# Patient Record
Sex: Female | Born: 1935 | Race: White | Hispanic: No | Smoking: Former smoker
Health system: Southern US, Community
[De-identification: ages and names within clinical notes are randomized; demographics above are authoritative.]

## PROBLEM LIST (undated history)

## (undated) DIAGNOSIS — D649 Anemia, unspecified: Secondary | ICD-10-CM

## (undated) DIAGNOSIS — K7460 Unspecified cirrhosis of liver: Secondary | ICD-10-CM

## (undated) DIAGNOSIS — K56609 Unspecified intestinal obstruction, unspecified as to partial versus complete obstruction: Secondary | ICD-10-CM

## (undated) DIAGNOSIS — Z5189 Encounter for other specified aftercare: Secondary | ICD-10-CM

## (undated) DIAGNOSIS — K5090 Crohn's disease, unspecified, without complications: Secondary | ICD-10-CM

## (undated) DIAGNOSIS — H5440 Blindness, one eye, unspecified eye: Secondary | ICD-10-CM

## (undated) DIAGNOSIS — J9610 Chronic respiratory failure, unspecified whether with hypoxia or hypercapnia: Secondary | ICD-10-CM

## (undated) DIAGNOSIS — K219 Gastro-esophageal reflux disease without esophagitis: Secondary | ICD-10-CM

## (undated) DIAGNOSIS — I480 Paroxysmal atrial fibrillation: Secondary | ICD-10-CM

## (undated) DIAGNOSIS — M1990 Unspecified osteoarthritis, unspecified site: Secondary | ICD-10-CM

## (undated) DIAGNOSIS — R0602 Shortness of breath: Secondary | ICD-10-CM

## (undated) DIAGNOSIS — I509 Heart failure, unspecified: Secondary | ICD-10-CM

## (undated) DIAGNOSIS — J449 Chronic obstructive pulmonary disease, unspecified: Secondary | ICD-10-CM

## (undated) DIAGNOSIS — I10 Essential (primary) hypertension: Secondary | ICD-10-CM

## (undated) DIAGNOSIS — F419 Anxiety disorder, unspecified: Secondary | ICD-10-CM

## (undated) DIAGNOSIS — K429 Umbilical hernia without obstruction or gangrene: Secondary | ICD-10-CM

## (undated) DIAGNOSIS — I517 Cardiomegaly: Secondary | ICD-10-CM

## (undated) DIAGNOSIS — J90 Pleural effusion, not elsewhere classified: Secondary | ICD-10-CM

## (undated) DIAGNOSIS — R601 Generalized edema: Secondary | ICD-10-CM

## (undated) DIAGNOSIS — G709 Myoneural disorder, unspecified: Secondary | ICD-10-CM

## (undated) HISTORY — PX: BASAL CELL CARCINOMA EXCISION: SHX1214

## (undated) HISTORY — PX: TUBAL LIGATION: SHX77

## (undated) HISTORY — PX: OTHER SURGICAL HISTORY: SHX169

## (undated) HISTORY — DX: Crohn's disease, unspecified, without complications: K50.90

## (undated) HISTORY — PX: APPENDECTOMY: SHX54

## (undated) HISTORY — PX: VESICOVAGINAL FISTULA CLOSURE W/ TAH: SUR271

## (undated) HISTORY — PX: COLON SURGERY: SHX602

## (undated) HISTORY — PX: CHOLECYSTECTOMY: SHX55

## (undated) HISTORY — DX: Paroxysmal atrial fibrillation: I48.0

## (undated) HISTORY — PX: ABDOMINAL SURGERY: SHX537

## (undated) HISTORY — DX: Blindness, one eye, unspecified eye: H54.40

## (undated) HISTORY — DX: Anxiety disorder, unspecified: F41.9

## (undated) HISTORY — PX: ABDOMINAL HYSTERECTOMY: SHX81

## (undated) HISTORY — DX: Essential (primary) hypertension: I10

---

## 1998-04-16 ENCOUNTER — Ambulatory Visit (HOSPITAL_COMMUNITY): Admission: RE | Admit: 1998-04-16 | Discharge: 1998-04-16 | Payer: Self-pay | Admitting: Gastroenterology

## 2003-01-09 ENCOUNTER — Ambulatory Visit (HOSPITAL_COMMUNITY): Admission: RE | Admit: 2003-01-09 | Discharge: 2003-01-09 | Payer: Self-pay | Admitting: Internal Medicine

## 2004-02-25 ENCOUNTER — Other Ambulatory Visit: Admission: RE | Admit: 2004-02-25 | Discharge: 2004-02-25 | Payer: Self-pay | Admitting: Family Medicine

## 2004-09-16 ENCOUNTER — Ambulatory Visit: Payer: Self-pay | Admitting: Internal Medicine

## 2004-10-01 ENCOUNTER — Ambulatory Visit (HOSPITAL_COMMUNITY): Admission: RE | Admit: 2004-10-01 | Discharge: 2004-10-01 | Payer: Self-pay | Admitting: Internal Medicine

## 2004-10-01 ENCOUNTER — Ambulatory Visit: Payer: Self-pay | Admitting: Internal Medicine

## 2004-12-01 ENCOUNTER — Ambulatory Visit: Payer: Self-pay | Admitting: Internal Medicine

## 2005-04-03 ENCOUNTER — Ambulatory Visit: Payer: Self-pay | Admitting: Cardiology

## 2005-04-07 ENCOUNTER — Ambulatory Visit: Payer: Self-pay | Admitting: Internal Medicine

## 2005-04-13 ENCOUNTER — Ambulatory Visit: Payer: Self-pay | Admitting: Cardiology

## 2005-08-18 ENCOUNTER — Ambulatory Visit: Payer: Self-pay | Admitting: Internal Medicine

## 2005-08-26 ENCOUNTER — Ambulatory Visit (HOSPITAL_COMMUNITY): Admission: RE | Admit: 2005-08-26 | Discharge: 2005-08-26 | Payer: Self-pay | Admitting: Internal Medicine

## 2006-03-10 ENCOUNTER — Ambulatory Visit: Payer: Self-pay | Admitting: Gastroenterology

## 2006-08-18 ENCOUNTER — Ambulatory Visit: Payer: Self-pay | Admitting: Cardiology

## 2006-08-19 ENCOUNTER — Ambulatory Visit: Payer: Self-pay | Admitting: Internal Medicine

## 2006-08-25 ENCOUNTER — Ambulatory Visit: Payer: Self-pay | Admitting: Internal Medicine

## 2006-09-22 ENCOUNTER — Ambulatory Visit: Payer: Self-pay | Admitting: Internal Medicine

## 2007-03-02 ENCOUNTER — Ambulatory Visit: Payer: Self-pay | Admitting: Cardiology

## 2007-03-04 ENCOUNTER — Ambulatory Visit: Payer: Self-pay

## 2007-03-04 LAB — CONVERTED CEMR LAB
INR: 4 — ABNORMAL HIGH (ref 0.8–1.0)
Prothrombin Time: 25.4 s — ABNORMAL HIGH (ref 10.9–13.3)

## 2007-03-15 ENCOUNTER — Inpatient Hospital Stay (HOSPITAL_COMMUNITY): Admission: EM | Admit: 2007-03-15 | Discharge: 2007-03-16 | Payer: Self-pay | Admitting: Emergency Medicine

## 2007-03-15 ENCOUNTER — Ambulatory Visit: Payer: Self-pay | Admitting: Internal Medicine

## 2007-04-20 ENCOUNTER — Ambulatory Visit: Payer: Self-pay | Admitting: Cardiology

## 2007-06-08 ENCOUNTER — Ambulatory Visit: Payer: Self-pay | Admitting: Cardiology

## 2007-10-03 ENCOUNTER — Ambulatory Visit: Payer: Self-pay | Admitting: Cardiology

## 2007-11-09 ENCOUNTER — Ambulatory Visit: Payer: Self-pay | Admitting: Cardiology

## 2007-12-07 ENCOUNTER — Ambulatory Visit: Payer: Self-pay | Admitting: Cardiology

## 2008-04-25 ENCOUNTER — Ambulatory Visit: Payer: Self-pay | Admitting: Cardiology

## 2008-08-30 DIAGNOSIS — K5090 Crohn's disease, unspecified, without complications: Secondary | ICD-10-CM | POA: Insufficient documentation

## 2008-08-30 DIAGNOSIS — E119 Type 2 diabetes mellitus without complications: Secondary | ICD-10-CM

## 2008-08-30 DIAGNOSIS — E663 Overweight: Secondary | ICD-10-CM | POA: Insufficient documentation

## 2008-08-30 DIAGNOSIS — H5440 Blindness, one eye, unspecified eye: Secondary | ICD-10-CM

## 2008-08-30 DIAGNOSIS — I10 Essential (primary) hypertension: Secondary | ICD-10-CM | POA: Insufficient documentation

## 2008-08-30 DIAGNOSIS — I4891 Unspecified atrial fibrillation: Secondary | ICD-10-CM | POA: Insufficient documentation

## 2008-08-30 DIAGNOSIS — F411 Generalized anxiety disorder: Secondary | ICD-10-CM | POA: Insufficient documentation

## 2008-09-05 ENCOUNTER — Ambulatory Visit: Payer: Self-pay | Admitting: Cardiology

## 2008-09-27 ENCOUNTER — Telehealth: Payer: Self-pay | Admitting: Cardiology

## 2008-10-03 ENCOUNTER — Ambulatory Visit: Payer: Self-pay | Admitting: Cardiology

## 2009-02-18 ENCOUNTER — Encounter: Payer: Self-pay | Admitting: Cardiology

## 2009-02-19 ENCOUNTER — Encounter: Payer: Self-pay | Admitting: Cardiology

## 2009-04-02 ENCOUNTER — Ambulatory Visit: Payer: Self-pay | Admitting: Cardiology

## 2009-05-21 ENCOUNTER — Ambulatory Visit: Payer: Self-pay | Admitting: Cardiology

## 2009-05-21 ENCOUNTER — Encounter: Payer: Self-pay | Admitting: Cardiology

## 2009-05-22 ENCOUNTER — Encounter: Payer: Self-pay | Admitting: Cardiology

## 2009-06-03 ENCOUNTER — Ambulatory Visit: Payer: Self-pay | Admitting: Cardiology

## 2009-06-06 ENCOUNTER — Encounter: Payer: Self-pay | Admitting: Cardiology

## 2009-09-17 ENCOUNTER — Encounter: Payer: Self-pay | Admitting: Cardiology

## 2009-09-30 ENCOUNTER — Telehealth (INDEPENDENT_AMBULATORY_CARE_PROVIDER_SITE_OTHER): Payer: Self-pay | Admitting: *Deleted

## 2009-11-06 ENCOUNTER — Encounter: Payer: Self-pay | Admitting: Cardiology

## 2009-12-03 ENCOUNTER — Ambulatory Visit: Payer: Self-pay | Admitting: Cardiology

## 2010-07-01 ENCOUNTER — Ambulatory Visit
Admission: RE | Admit: 2010-07-01 | Discharge: 2010-07-01 | Payer: Self-pay | Source: Home / Self Care | Attending: Internal Medicine | Admitting: Internal Medicine

## 2010-07-01 NOTE — Assessment & Plan Note (Signed)
Summary: [facility]   Visit Type:  Follow-up Primary Provider:  Rudi Heap  CC:  Atrial Fibrillation.  History of Present Illness: The patient presents for follow up after  a brief hospitalization when she was noted to have atrial fibrillation with a slightly reduced ventricular rate. Her heart rate was in the 40s and 50s though there were no significant pauses. She ruled out for myocardial infarction. She did have her digoxin dose reduced in half. Her level on admission was 1.1. She says that since leaving the hospital several days ago she has had no slow heart rates, presyncope or syncope. She has not been feeling a rapid rates that were bothering her before. She has had no chest pain or shortness of breath.  Preventive Screening-Counseling & Management  Alcohol-Tobacco     Smoking Status: current     Packs/Day: 0.5     Year Started: 1958  Comments: off/on  Current Medications (verified): 1)  Prevacid 15 Mg Cpdr (Lansoprazole) .... Take 1 Tablet By Mouth Once A Day 2)  Warfarin Sodium 1 Mg Tabs (Warfarin Sodium) .... Use As Directed By Dr. Kathi Der Office. 3)  Metformin Hcl 1000 Mg Tabs (Metformin Hcl) .... Take 1 Tablet By Mouth Two Times A Day 4)  Alprazolam 0.5 Mg  Tabs (Alprazolam) .... As Needed 5)  Multivitamins   Tabs (Multiple Vitamin) .... Once Daily 6)  Vitamin C 500 Mg  Tabs (Ascorbic Acid) .... Once Daily 7)  Digoxin 0.25 Mg Tabs (Digoxin) .... Take 1/2 Tablet By Mouth Daily 8)  Acetaminophen 500 Mg  Caps (Acetaminophen) .... Take 1 Capsule By Mouth Two Times A Day As Needed 9)  Maxzide 75-50 Mg Tabs (Triamterene-Hctz) .... Once Daily 10)  Cardizem Cd 180 Mg Xr24h-Cap (Diltiazem Hcl Coated Beads) .... Once Daily 11)  Potassium Chloride Cr 10 Meq Cr-Caps (Potassium Chloride) .... Take One Tablet By Mouth Twice Weekly 12)  Vitamin D 500 Iu .... Take 1 Tablet By Mouth Once A Day 13)  Asacol Hd 800 Mg Tbec (Mesalamine) .... Take 1 Tablet By Mouth Two Times A  Day 14)  Vitamin B12 Injection .... Once Per Month 15)  Multivitamins  Tabs (Multiple Vitamin) .... Take 1 Tablet By Mouth Once A Day  Allergies: 1)  Sulfa  Comments:  Nurse/Medical Assistant: The patient's medications were reviewed with the patient and were updated in the Medication List. Pt brought a list of her medications.  Past History:  Past Medical History: Reviewed history from 04/02/2009 and no changes required. 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 Basal cell Cancers resected Multiple abdominal surgeries Hysterectomy Diabetes.   Review of Systems       As stated in the HPI and negative for all other systems.   Vital Signs:  Patient profile:   75 year old female Height:      60 inches Weight:      185.25 pounds Pulse rate:   89 / minute BP sitting:   135 / 84  (left arm) Cuff size:   regular  Vitals Entered By: Cyril Loosen, RN, BSN (June 03, 2009 11:59 AM) CC: Atrial Fibrillation Comments Follow up post hospital. No complaints since d/c.   Physical Exam  General:  Well developed, well nourished, in no acute distress. Head:  normocephalic and atraumatic Mouth:  Gums and palate normal. Oral  mucosa normal. Neck:  Neck supple, no JVD. No masses, thyromegaly or abnormal cervical nodes. Chest Wall:  no deformities or breast masses noted Lungs:  Clear bilaterally to auscultation and percussion. Heart:  Non-displaced PMI, chest non-tender;irregular rate and rhythm, S1, S2 without murmurs, rubs or gallops. Carotid upstroke normal, no bruit. Normal abdominal aortic size, no bruits. Femorals normal pulses, no bruits. Pedals normal pulses. No edema, no varicosities. Abdomen:  Bowel sounds positive; abdomen soft and non-tender without masses,  organomegaly, or hernias noted. No hepatosplenomegaly. Msk:  Back normal, normal gait. Muscle strength and tone normal. Extremities:  No clubbing or cyanosis. Neurologic:  Alert and oriented x 3. Skin:  Intact without lesions or rashes. Psych:  Normal affect.   Impression & Recommendations:  Problem # 1:  ATRIAL FIBRILLATION (ICD-427.31) Her her rate seems to be better with no symptomatic bradycardia arrhythmias and currently no symptomatic tachycardia arrhythmias. Therefore, she will continue on the current regimen. She will continue on the Coumadin which is followed by Dr. Christell Constant. Orders: EKG w/ Interpretation (93000)  Problem # 2:  HYPERTENSION, UNSPECIFIED (ICD-401.9) Her blood pressure is controlled and she will continue the medicines as listed.  Patient Instructions: 1)  Your physician recommends that you continue on your current medications as directed. Please refer to the Current Medication list given to you today. 2)  Follow up in 6 months.

## 2010-07-01 NOTE — Progress Notes (Signed)
Summary: Low HR  Phone Note Call from Patient Call back at [facility] Phone [phone]   Summary of Call: Pt called the office stating her HR was running around 41. She states it's up to about 46 now. She feels weak but denies any other symptoms at present. She was admitted to [facility] in mid-December with low HR. At that time her digoxin was decreased to 1/2 tablet daily. She wants to know if she should take digoxin today. Pt notified to hold digoxin today and resume tomorrow if HR has increased. Pt is aware to go to ER for worsening symptoms, dizziness or syncope. Pt verbalized understanding. She is aware we will d/w Dr. Antoine Poche and notify her of his recommendations.  Initial call taken by: Cyril Loosen, RN, BSN,  Sep 30, 2009 11:55 AM  Follow-up for Phone Call        Tell her to stop digoxin completely. Follow-up by: Rollene Rotunda, MD, [facility],  Oct 01, 2009 5:34 PM  Additional Follow-up for Phone Call Additional follow up Details #1::        Pt notified and verbalized understanding. She will notify us if bradycardia continues after stopping Digoxin. Additional Follow-up by: Cyril Loosen, RN, BSN,  Oct 02, 2009 8:27 AM

## 2010-07-01 NOTE — Assessment & Plan Note (Signed)
Summary: 6 mo fu per july reminder-srs   Visit Type:  Follow-up Primary Provider:  Rudi Heap  CC:  Atrial Fibrillation.  History of Present Illness: The patient presents for followup. Since I last saw her she has had no new complaints. She is gaining weight because she admits to being too much. She says she does try to walk 3-4 times per week. She doesn't report any palpitations, presyncope or syncope. She doesn't report chest discomfort, neck or arm discomfort. She has no new shortness of breath, PND or orthopnea.  Preventive Screening-Counseling & Management  Alcohol-Tobacco     Smoking Status: current     Smoking Cessation Counseling: yes     Packs/Day: 1/2 ppd  Current Medications (verified): 1)  Prevacid 15 Mg Cpdr (Lansoprazole) .... Take 1 Tablet By Mouth Once A Day 2)  Warfarin Sodium 1 Mg Tabs (Warfarin Sodium) .... Use As Directed By Dr. Kathi Der Office. 3)  Metformin Hcl 1000 Mg Tabs (Metformin Hcl) .... Take 1 Tablet By Mouth Two Times A Day 4)  Alprazolam 0.5 Mg  Tabs (Alprazolam) .... As Needed 5)  Vitamin C 500 Mg  Tabs (Ascorbic Acid) .... Once Daily 6)  Acetaminophen 500 Mg  Caps (Acetaminophen) .... Take 1 Capsule By Mouth Two Times A Day As Needed 7)  Maxzide 75-50 Mg Tabs (Triamterene-Hctz) .... Once Daily 8)  Cardizem Cd 180 Mg Xr24h-Cap (Diltiazem Hcl Coated Beads) .... Once Daily 9)  Potassium Chloride Cr 10 Meq Cr-Caps (Potassium Chloride) .... Take One Tablet By Mouth Twice Weekly 10)  Vitamin D 500 Iu .... Take 1 Tablet By Mouth Once A Day 11)  Asacol Hd 800 Mg Tbec (Mesalamine) .... Take 1 Tablet By Mouth Two Times A Day 12)  Vitamin B12 Injection .... Once Per Month 13)  Multivitamins  Tabs (Multiple Vitamin) .... Take 1 Tablet By Mouth Once A Day 14)  Gabapentin 300 Mg Caps (Gabapentin) .... Take 1 Tablet By Mouth Once A Day  Allergies (verified): 1)  Sulfa  Comments:  Nurse/Medical Assistant: The patient's medication bottles and allergies were  reviewed with the patient and were updated in the Medication and Allergy Lists.  Past History:  Past Medical History: Reviewed history from 04/02/2009 and no changes required. 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 Basal cell Cancers resected Multiple abdominal surgeries Hysterectomy Diabetes.   Social History: Packs/Day:  1/2 ppd  Review of Systems       As stated in the HPI and negative for all other systems.   Vital Signs:  Patient profile:   75 year old female Height:      60 inches Weight:      197 pounds Pulse rate:   80 / minute BP sitting:   118 / 73  (left arm) Cuff size:   large  Vitals Entered By: Carlye Grippe (December 03, 2009 11:18 AM)  Physical Exam  General:  Well developed, well nourished, in no acute distress. Head:  normocephalic and atraumatic Neck:  Neck supple, no JVD. No masses, thyromegaly or abnormal cervical nodes. Chest Wall:  no deformities or breast masses noted Lungs:  Clear bilaterally to auscultation and percussion. Heart:  Irregular, S1 and S2 within normal limits, no S3, no clicks, no rubs, no murmurs. Abdomen:  Bowel sounds positive; abdomen soft and non-tender without masses, organomegaly, or hernias  noted. No hepatosplenomegaly. Msk:  Back normal, normal gait. Muscle strength and tone normal. Extremities:  No clubbing or cyanosis. Neurologic:  Alert and oriented x 3. Skin:  Intact without lesions or rashes, multiple actinic keratosis Psych:  Normal affect.    Impression & Recommendations:  Problem # 1:  ATRIAL FIBRILLATION (ICD-427.31) She appears to be in atrial fibrillation at this time by exam. She is not particularly noticing this. She has had no symptomatic slow rates which has been her recent problem. She  tolerates Coumadin. No change in therapy is indicated.  Problem # 2:  OVERWEIGHT/OBESITY (ICD-278.02) She understands the need to cut back her calories and increase her activity to lose weight. Unfortunately she is going in the opposite direction.  Problem # 3:  HYPERTENSION, UNSPECIFIED (ICD-401.9) Her blood pressure is controlled and she will continue the meds as listed.  Patient Instructions: 1)  Your physician wants you to follow-up in: 1 year. You will receive a reminder letter in the mail one-two months in advance. If you don't receive a letter, please call our office to schedule the follow-up appointment. 2)  Your physician recommends that you continue on your current medications as directed. Please refer to the Current Medication list given to you today.

## 2010-10-14 NOTE — Assessment & Plan Note (Signed)
[facility]                            CARDIOLOGY OFFICE NOTE   NAME:[name], [name] TEEM                    MRN:          [id number]  DATE:09/05/2008                            DOB:          09-12-1935    PRIMARY CARE PHYSICIAN:  Ernestina Penna, MD   REASON FOR PRESENTATION:  Evaluate the patient with atrial fibrillation  and hypertension.   HISTORY OF PRESENT ILLNESS:  The patient returns for 46-month followup.  She has done okay since I last saw her.  She has had no new  palpitations, presyncope, or syncope.  She has no new shortness of  breath and denies any PND or orthopnea.  She does get dyspneic with  moderate exertion such as walking for moderate distance on level ground.  This is unchanged.  She has been diagnosed with cirrhosis and is being  worked up for possible hepatitis C.   PAST MEDICAL HISTORY:  1. Hypertension x5 years.  2. Paroxysmal atrial fibrillation.  3. Anxiety.  4. Crohn disease.  5. 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  6. Basal cell cancer resected.  7. Multiple abdominal surgeries.  8. Hysterectomy.  9. Diabetes.   ALLERGIES:  SULFA, KEFLEX, CIPRO, and NONSTEROIDALS.   MEDICATIONS:  1. Tylenol.  2. Asacol.  3. Prevacid.  4. Multivitamin.  5. Potassium.  6. Vitamin C.  7. Coumadin.  8. Digoxin 0.25 mg daily.  9. Maxzide 75/50 daily.  10.Cardizem 180 mg daily.  11.Januvia.  12.Metformin 1000 mg b.i.d.   REVIEW OF SYSTEMS:  As stated in the HPI and otherwise negative for  other systems.   PHYSICAL EXAMINATION:  GENERAL:  The patient is in no distress.  VITAL SIGNS:  Blood pressure 126/62 and heart rate 51 and regular.  HEENT:  Eyelids unremarkable; pupils are equal, round, and reactive to  light; fundi not visualized.  NECK:  No jugular venous distention at 45 degrees, carotid upstroke  brisk and symmetric, no bruits, no thyromegaly.  LUNGS:   Clear to auscultation bilaterally.  HEART:  PMI not displaced or sustained; S1 and S2 within normal limits;  no S3, no S4; no clicks, no rubs, no murmurs.  ABDOMEN:  Obese; positive bowel sounds, normal in frequency and pitch;  no bruits, no rebound, no guarding, no midline pulsatile mass; no  organomegaly.  SKIN:  No nodules, multiple actinic keratosis.  NEURO:  Grossly intact.   EKG, sinus bradycardia, rate 51, axis within normal limits, intervals  within normal limits, no acute ST-wave changes.   ASSESSMENT AND PLAN:  1. Atrial fibrillation.  The patient has had no paroxysm of this.  No      further cardiovascular evaluation is warranted.  She will continue      on the meds as listed.  She continues on the Coumadin.  2. Hypertension.  Blood pressure is well controlled and she brought a      blood pressure diary.  I will make no change in her medical regimen      at this point.  3. Followup.  I will see her back as needed based on future symptoms.     Rollene Rotunda, MD, [facility]  Electronically Signed    JH/MedQ  DD: 09/05/2008  DT: 09/06/2008  Job #: [id number]   cc:   Ernestina Penna, M.D.

## 2010-10-14 NOTE — Assessment & Plan Note (Signed)
[facility]                            CARDIOLOGY OFFICE NOTE   NAME:[name], [name]                    MRN:          [id number]  DATE:12/07/2007                            DOB:          1935/09/30    PRIMARY CARE PHYSICIAN:  Ernestina Penna, MD   REASON FOR PRESENTATION:  Evaluate the patient with atrial fibrillation.   HISTORY OF PRESENT ILLNESS:  The patient is a pleasant 75 year old white  female with a history of paroxysmal atrial fibrillation.  Since I last  saw her in January, she has had two [place] visits for palpitations.  One was in May.  The last was on November 09, 2007.  She states she would  wait about 2 or 3 days with the atrial fibrillation before she will  present.  I only have the May hospitalization report.  It is not  entirely clear from this that she was in atrial fibrillation, but she  was having chest pressure.  She reports that she was in fibrillation,  but did not have her meds changed.  She states she has not had any  episode since that time.  When she does have the tachypalpitations, she  may get little short of breath.  She may feel a little weak.  However,  she states she really does not have any chest pressure, neck, or arm  discomfort.  She has not had any presyncope or syncope.  She denies any  PND or orthopnea.   PAST MEDICAL HISTORY:  1. Hypertension x5 years.  2. Paroxysmal atrial fibrillation.  3. Anxiety.  4. Crohn disease.  5. 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  6. Basal cell cancer, resected.  7. Multiple abdominal surgeries for Crohn's.  8. Hysterectomy.   ALLERGIES:  SULFA, KEFLEX, CIPRO, and NONSTEROIDALS.   MEDICATIONS:  1. Tylenol.  2. Asacol.  3. Prevacid 30 mg daily.  4. Potassium.  5. Multivitamin.  6. Coumadin.  7. Cardizem 120 mg daily.  8. Digoxin 0.25 mg daily.  9. Maxzide 75/50.   REVIEW OF SYSTEMS:  As stated in the  HPI and otherwise negative for  other systems.   PHYSICAL EXAMINATION:  GENERAL:  The patient is in no acute distress.  VITAL SIGNS:  Blood pressure 176/64, heart rate 66 and regular, weight  234 pounds, and body mass index 46.  HEENT:  Eyelids are unremarkable; pupils equal, round, and reactive to  light; fundi not visualized; oral mucosa unremarkable.  NECK:  No jugular venous distention at 45 degrees; carotid upstroke  brisk and symmetric; no bruits and no thyromegaly.  LYMPHATICS:  No cervical, axillary, or inguinal adenopathy.  LUNGS:  Clear to auscultation bilaterally.  BACK:  No costovertebral angle tenderness.  CHEST:  Unremarkable.  HEART:  PMI is not displaced or sustained; S1 and S2 within normal  limits; no S3, no S4; no clicks, no rubs, and no murmurs.  ABDOMEN:  Morbidly obese; positive bowel sounds; normal in frequency and  pitch; no bruits, no rebound, no guarding, no midline pulsatile  mass; no  hepatomegaly and no splenomegaly.  SKIN:  No rashes; multiple actinic keratoses; there is a healing wound,  where she had skin lesion removed.  NEURO:  Oriented to person, place, and time; cranial nerves II through  XII grossly intact; motor grossly intact.   EKG, sinus rhythm, rate 66, axis within normal limits; intervals within  normal limits; low voltage in the limb leads and chest leads; no acute  ST-T wave changes.   ASSESSMENT AND PLAN:  1. Atrial fibrillation.  The patient is having tachypalpitations that      I suspect are atrial fibrillation, though I cannot confirm these to      have been documented.  At this point, I am going to increase her      Cardizem to 180 mg and see if this helps.  She is on Coumadin.  If      she continues to have paroxysms that are symptomatic, we may need      to consider an antiarrhythmic.  For now, she has not had one in the      last month, and we will see how she does with a higher dose of      Cardizem.  2. Hypertension.  We would  manage this in the context of increasing      her Cardizem.  She also needs weight loss.  3. Morbid obesity.  She understands the need for weight loss with diet      and exercise.  4. Anxiety.  The patient is not on any therapy for this, and we will      discuss with her primary care doctor.  5. Followup.  I will see the patient back in about 4 months or sooner      if she has any increased palpitations.     Rollene Rotunda, MD, [facility]  Electronically Signed    JH/MedQ  DD: 12/07/2007  DT: 12/08/2007  Job #: [id number]

## 2010-10-14 NOTE — Assessment & Plan Note (Signed)
[facility]                            CARDIOLOGY OFFICE NOTE   NAME:[name], [name]                    MRN:          [id number]  DATE:04/25/2008                            DOB:          1936/05/03    PRIMARY CARE PHYSICIAN:  Ernestina Penna, MD   REASON FOR PRESENTATION:  Evaluate the patient with hypertension and  atrial fibrillation.   HISTORY OF PRESENT ILLNESS:  The patient is 75 years old.  She presents  for followup of the above.  At the last point, I increased her Cardizem  to 180 mg daily as she had 2 symptomatic paroxysms of atrial  fibrillation.  She actually did not start this higher dose until last  week.  She says her heart rate has been running low.  She has had no  paroxysms of rapid rate, however.  She has had no palpitation, denies  any presyncope or syncope.  She is tolerating the low heart rate.  She  has had no chest pressure, neck or arm discomfort.  She gets short of  breath with moderate activity, but is denying any resting shortness of  breath and has had no PND or orthopnea.  She has been taking her blood  pressure at home every morning and says it is actually in the 130s  though it is elevated consistently here.   PAST MEDICAL HISTORY:  1. Hypertension x5 years.  2. Paroxysmal atrial fibrillation.  3. Anxiety.  4. Crohn disease.  5. 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  6. Basal cell cancer, resected.  7. Multiple abdominal surgeries.  8. Hysterectomy.  9. Diabetes, newly diagnosed.   ALLERGIES:  SULFA, KEFLEX, CIPRO, and NONSTEROIDALS.   CURRENT MEDICATIONS:  1. Tylenol.  2. Asacol.  3. Prevacid 30 mg daily.  4. Potassium 2 times weekly (I am not sure of the dose).  5. Multivitamin.  6. Vitamin C.  7. Coumadin.  8. Digoxin 0.25 mg daily.  9. Maxzide 75/50.  10.Cardizem 180 mg daily.  11.Metformin 500 mg b.i.d.  12.Januvia.   REVIEW OF  SYSTEMS:  As stated in the HPI and otherwise negative for  other systems.   PHYSICAL EXAMINATION:  GENERAL:  The patient is pleasant and in no  distress.  VITAL SIGNS:  Blood pressure 172/62, heart 49 and regular, weight 223  pounds, body mass index 45.  HEENT:  Eyelids unremarkable, pupils equal, round, and reactive to  light, fundi not visualized, oral mucosa unremarkable.  NECK:  No jugular venous distention at 45 degrees, carotid upstroke  brisk and symmetric, no bruits, no thyromegaly.  LYMPHATICS:  No cervical, axillary, or inguinal adenopathy.  LUNGS:  Clear to auscultation bilaterally.  BACK:  No costovertebral angle tenderness.  CHEST:  Unremarkable.  HEART:  PMI not displaced or sustained, S1 and S2 within normal limits,  no S3, no S4, no clicks, no rubs, no murmurs.  ABDOMEN:  Morbidly obese, positive bowel sounds normal in frequency and  pitch, no bruits, no rebound, no guarding, no midline pulsatile mass,  no  organomegaly.  SKIN:  No rash, multiple actinic keratosis, no nodules.  NEUROLOGIC:  Oriented to person, place, and time, cranial nerves II  through XII grossly intact, motor grossly intact.   EKG:  Sinus bradycardia, rate 49, axis within normals, intervals within  normal limits, low voltage, no acute ST-wave changes.   ASSESSMENT AND PLAN:  1. Atrial fibrillation.  The patient has had no symptomatic paroxysms      of this.  Despite her bradycardia, she is tolerating the increased      dose of Cardizem.  She should continue with this and the other      medicines as listed.  She does need her Coumadin.  2. Hypertension.  Blood pressure is elevated in the office, but she      says it is well controlled at home.  I am going to ask her to vary      the time that she takes her blood pressure and report with a blood      pressure diary.  I think she is going to need med titration.  As      she is newly diagnosed with diabetes, she would benefit from an ARB      or an  ACE.  3. Diabetes, per Dr. Christell Constant.  4. Obesity.  She understands the need to lose weight with diet and      exercise.  5. Followup.  I will see her back in about 6 months or sooner if      needed.      Rollene Rotunda, MD, [facility]  Electronically Signed    JH/MedQ  DD: 04/25/2008  DT: 04/26/2008  Job #: [id number]   cc:   Ernestina Penna, M.D.

## 2010-10-14 NOTE — Assessment & Plan Note (Signed)
[facility]                            CARDIOLOGY OFFICE NOTE   NAME:[name], [name]                    MRN:          [id number]  DATE:03/02/2007                            DOB:          1935-10-18    PRIMARY CARE PHYSICIAN:  Ernestina Penna, M.D.   REASON FOR PRESENTATION:  Evaluate patient with new-onset atrial  fibrillation.   HISTORY OF PRESENT ILLNESS:  The patient is 75 years old.  I saw her in  March for evaluation of chest discomfort.  No further cardiovascular  testing was suggested at that point and she was to come back to this  clinic as needed.  She has done well from that standpoint.  However,  over the last week or so, she has felt fatigued.  She has had a  decreased exercise tolerance.  She said she had been short of breath  walking down the street to her daughter's house or down into her yard.  She has not had any resting shortness of breath, PND or orthopnea.  She  has felt a little fluttering in her chest when she has lain on her left  side at night.  She has not had any chest pressure, neck or arm  discomfort.  She has not had any presyncope or syncope.  She has had no  fevers or chills.  She has had no weight-gain or new swelling.  She was  seen in Dr. Kathi Der office today for routine followup, when she was  noted to be in atrial fibrillation with a rapid ventricular rate.   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   PAST SURGICAL HISTORY:  Basal cell cancer, removed.  Multiple abdominal  surgeries for Crohn's.  Hysterectomy.   ALLERGIES:  She avoids NSAIDS, KEFLEX caused diarrhea, CIPRO caused  nausea, she is not able to tolerate SULFA or FLU SHOTS.   MEDICATIONS:  1. Tylenol  2. Asacol.  3. Prevacid 30 mg daily.  4. Potassium.  5. Multivitamin.  6. Vitamin C.   SOCIAL HISTORY:  She is  retired, she is a widow, she has two children.  She has been smoking a half pack of cigarettes a day for 35 years and  still smokes.   FAMILY HISTORY:  Contributory for father having a myocardial infarction  at 48 and a brother with myocardial infarction at age 3.   REVIEW OF SYSTEMS:  Positive for joint pains, reflux.  She is negative  for other systems.  In particular, I carefully questioned her about any  bleeding history.  She denies any signs or symptoms consistent with GI  bleeding or other bleeding diathesis.   PHYSICAL EXAMINATION:  The patient is in no distress.  Weight 238  pounds, body mass index 46.  Blood pressure 130/78, heart rate 104 and  irregular.  HEENT:  Eyelids unremarkable.  Pupils equal, round and reactive to  light.  Fundi not visualized.  Oral mucosa unremarkable.  NECK:  No jugular venous distention at 45 degrees.  Carotid upstroke  brisk and symmetric.  No bruits, no thyromegaly.  LYMPHATICS:  No cervical, axillary or inguinal adenopathy.  LUNGS:  Clear to auscultation bilaterally.  BACK:  No costovertebral angle tenderness.  CHEST:  Unremarkable.  HEART:  PMI not displaced or sustained.  S1 and S2 within normal limits.  No S3, no clicks, no rubs, no murmurs.  ABDOMEN:  Obese, positive bowel sounds, normal in frequency and pitch.  No bruits, no rebound, no guarding, no midline pulsatile mass, no  hepatomegaly, no splenomegaly.  SKIN:  No rashes, diffuse nodular, scaly lesions (I am not sure of this  diagnosis).  NEUROLOGIC:  Oriented to person, place and time.  Cranial nerves II  through XII grossly intact.  Motor grossly intact.  EXTREMITIES:  Two-plus pulses throughout, trace bilateral lower  extremity edema.   EKG:  Atrial fibrillation, axis within normal limits, intervals within  normal limits, no acute STT-wave changes.   ASSESSMENT AND PLAN:  1. Atrial fibrillation:  Patient has new-onset atrial fibrillation.      This has clearly gone on for  more than 48 hours.  She has no      contraindications to anticoagulation.  We discussed this at length.      She understands the risks and benefits of Coumadin and I will start      this medication at 5 mg per night.  We will get it checked on      Friday.  She will be given Coumadin education.  She will be started      on Cardizem 30 mg q. 8 hours for rate control.  I did offer her      hospitalization and suggested to her that we could achieve rate      control and perhaps even rhythm control with this strategy.  She      prefers to see how she does at home, as she is in no distress.  She      knows to call 911 and present to the hospital if she has any      increasing symptoms and she says she will do this.  2. Tobacco:  We again discussed the need to stop smoking.  3. Hypertension:  Her blood pressure is controlled.  She will continue      the medications with the addition of the Cardizem.  4. Followup:  I will see the patient on Friday, or sooner if she has      any acute problems.  She will come down to the [place] office.      Of note, she did have labs drawn today and I will be looking for      these.  This was drawn by her primary care office.    Rollene Rotunda, MD, [employer]  Electronically Signed   JH/MedQ  DD: 03/02/2007  DT: 03/02/2007  Job #: [id number]   cc:   Ernestina Penna, M.D.

## 2010-10-14 NOTE — H&P (Signed)
[name], [name]             ACCOUNT NO.:  [id number]   MEDICAL RECORD NO.:  [id number]          PATIENT TYPE:  GRG   LOCATION:  AGRG                          FACILITY:  [facility]   PHYSICIAN:  [name], MDDATE OF BIRTH:  03-17-1936   DATE OF ADMISSION:  DATE OF DISCHARGE:                              HISTORY & PHYSICAL   PRIMARY CARDIOLOGIST:  [name] Rotunda, MD, [name]   PRIMARY CARE PHYSICIAN:  [name], M.D.   HISTORY OF PRESENT ILLNESS:  This is a 75 year old obese Caucasian  female with known history of atrial fibrillation, recently diagnosed  October 2008.  The patient also a history of hypertension and Crohn's  disease.  The patient awoke around 1 a.m. last night with some left  upper abdominal bubbling, chest pressure, and feeling uncomfortable.  The patient waited until around 2 a.m. and took a Xanax to feel better.  He was able to sleep the rest of the night but awoke this morning  feeling weak with shortness of breath, her heart racing, and chest  pressure remaining.  The patient states that she felt her heart racing  as well around 1 a.m.  Secondary to these symptoms, she called her  daughter, was able to eat some breakfast, thinking that might have  helped with some of her weakness, and was brought to the emergency room.  In the emergency room, the patient was found to have atrial fibrillation  with RVR with a ventricular rate of 120 beats per minute.  She was given  Cardizem 10 mg IV x1 with Cardizem drip at 5 mg/hour.  We are asked to  evaluate the patient further with her history of atrial fibrillation and  chest discomfort.   REVIEW OF [name]:  Positive for chest pressure, shortness of breath,  palpitations, abdominal pain.  Denies nausea, vomiting, dizziness or  diaphoresis.   PAST MEDICAL HISTORY:  Atrial fibrillation first diagnosed March 02, 2007.  Hypertension, anxiety, Crohn's, left eye blindness.   PAST SURGICAL HISTORY:  Basal cell  carcinoma removal.  Hysterectomy and  intestinal surgery secondary to Crohn's.   FAMILY HISTORY:  Father died at age 47 from a MI.  Mother had a MI at  age 39.   CURRENT MEDICATIONS:  1. Coumadin 2.5 mg once a day.  2. Cardizem 30 mg t.i.d.  3. Asacol daily.  4. Prevacid daily.  5. Multivitamin.  6. Vitamin C daily.  7. Triamterene HCTZ 75/50 mg daily.  8. Xanax p.r.n.   CURRENT LABORATORY:  Sodium 138, potassium 3.8, chloride 101, BUN 5.  Patient's blood glucose 187.  Hemoglobin 15.6, hematocrit 46.0.  Troponin less than 0.05.  CK 65, MB less than 1.0 (these are point of  care labs).  EKG Reveals atrial fibrillation with rate of 112 beats per  minute.  Chest x-ray reveals cardiomegaly with bibasilar atelectasis.   PHYSICAL EXAMINATION:  VITAL SIGNS:  Blood pressure 120/74, pulse 101,  respirations 18, O2 saturation 98% on 2 liters.  HEENT:  Head is normocephalic and atraumatic.  Eyes - PERRLA.  Mucous  membranes of the mouth pink and moist.  Tongue is midline.  NECK:  Obese, nontender, no thyromegaly, no carotid bruits.  No JVD is  seen.  CARDIOVASCULAR:  Irregular rhythm, tachycardiac.  Without murmurs, rubs  or gallops.  Pulses are 2+ and equal bilaterally.  LUNGS:  Clear to auscultation without wheezes, rales or rhonchi.  ABDOMEN:  Obese with some mild tenderness in the left upper quadrant  with 2+ bowel sounds.  EXTREMITIES:  Without clubbing, cyanosis or edema.  SKIN:  Seborrheic dermatosis is noted.   IMPRESSION:  1. Atrial fibrillation with RBR.  2. Nonsustained supraventricular tachycardia, asymptomatic.  3. Supratherapeutic INR.  4. History of Crohn's disease.  5. History of hypertension.   PLAN:  The patient was seen and examined by myself and Dr. Arvilla Meres in the emergency room.  Agree with Cardizem drip.  The patient  is without symptoms in atrial fibrillation with RBR and nonsustained  ventricular tachycardia.  The patient also has mild chest pain.   We will  admit, rule out myocardial infarction, rate control with Cardizem.  At  this time, we will try to wean Cardizem drip and start her on Cardizem  120 mg 1 p.o. daily.  We will also start beta blocker, Lopressor 12.5 mg  b.i.d. with parameters for heart rate and blood pressure.  The patient  will have an echocardiogram and adenosine and Myoview in the morning.  Magnesium will also be checked and support her potassium.  There will be  more recommendations throughout hospital course depending on her  response to treatment.      [name] Mare. Lyman Bishop, NP      [name] Buckles. Bensimhon, MD  Electronically Signed    KML/MEDQ  D:  03/15/2007  T:  03/15/2007  Job:  [id number]   cc:   [name], M.D.

## 2010-10-14 NOTE — Assessment & Plan Note (Signed)
[facility]                            CARDIOLOGY OFFICE NOTE   NAME:[name], [name] COOLEY                    MRN:          [id number]  DATE:10/03/2008                            DOB:          1936-02-27    PRIMARY CARE PHYSICIAN:  Ernestina Penna, MD   REASON FOR PRESENTATION:  Evaluate the patient with atrial fibrillation.   HISTORY OF PRESENT ILLNESS:  The patient is a pleasant 75 year old with  paroxysmal atrial fibrillation.  She called the other day describing  some palpitations.  She said her heart rate was in the 80s and 90s.  She  does not report rates in the 120s or 130s.  It happened at rest.  It  seemed to be short-lived.  She did not have any presyncope or syncope.  She did not have any chest discomfort or shortness of breath.  She just  felt her heart beating particularly when she would lie on her left side.  She called and was added to the schedule.  She thinks she can tell when  she is in atrial fibrillation and she does not report being in it for  long periods of time.  She has had no new chest pain or new shortness of  breath.   PAST MEDICAL HISTORY:  Hypertension x5 years, paroxysmal atrial  fibrillation, anxiety, Crohn disease, left eye blindness apparently from  thromboembolism (I have no clear documentation of this), basal cell  cancers resected, multiple abdominal surgeries, hysterectomy, and  diabetes.   ALLERGIES:  SULFA, KEFLEX, CIPRO, and NONSTEROIDALS.   MEDICATIONS:  1. Tylenol.  2. Asacol.  3. Prevacid 30 mg daily.  4. Potassium.  5. Multivitamin.  6. Vitamin D.  7. Coumadin.  8. Digoxin 0.25 mg daily.  9. Maxzide 75/50.  10.Cardizem 180 mg daily.  11.Metformin 1000 mg b.i.d.  12.Januvia 100 mg daily.   REVIEW OF SYSTEMS:  As stated in the HPI and otherwise negative for  other systems.   PHYSICAL EXAMINATION:  GENERAL:  The patient is in no distress.  VITAL SIGNS:  Blood pressure 128/60, heart rate 53 and  regular, weight  191 pounds, and body mass index 43.  NECK:  No jugular venous distention at 45 degrees, carotid upstroke  brisk and symmetric, no bruits, no thyromegaly.  LUNGS:  Clear to auscultation.  CHEST:  Unremarkable.  HEART:  PMI not displaced or sustained; S1 and S2 within normal limits,  no S3, no S4; no clicks, no rubs, no murmurs.  ABDOMEN:  Obese; positive bowel sounds, normal in frequency and pitch;  no bruits, no rebound, no guarding, no midline pulsatile mass; no  organomegaly.  SKIN:  No rashes, no nodules.  EXTREMITIES:  Pulses 2+, no edema.   EKG:  Sinus bradycardia, rate 53, axis within normal limits, intervals  within normal limits, low voltage in the chest and limb leads, no acute  ST wave changes.   ASSESSMENT AND PLAN:  1. Atrial fibrillation.  The patient is having paroxysms of atrial      fibrillation.  I have tried to pull more symptoms from her, but  these bouts do not seem particularly symptomatic or frequent or      sustained.  She was just concerned.  She is quite anxious.  At this      point, she seems to be on good rate control and therapeutic      anticoagulation.  I do not think there is justification for      antiarrhythmic therapy.  Further evaluation will be based on the      frequency of symptoms.  2. Hypertension.  Blood pressure is controlled and she will continue      the meds as listed.  3. Followup.  She wants to be seen in the [facility].  She wants to      be hospitalized at [facility] if necessary once the physician is down      there, we will arrange this.  She will be seen in 6 months.     Rollene Rotunda, MD, [facility]  Electronically Signed    JH/MedQ  DD: 10/03/2008  DT: 10/04/2008  Job #: [id number]   cc:   Ernestina Penna, M.D.

## 2010-10-14 NOTE — Discharge Summary (Signed)
NAME:  [name], [name]             ACCOUNT NO.:  [id number]   MEDICAL RECORD NO.:  [id number]          PATIENT TYPE:  GRG   LOCATION:  AGRG                          FACILITY:  [facility]   PHYSICIAN:  Thomas C. Wall, MD, FACCDATE OF BIRTH:  [birth year]   DATE OF ADMISSION:  03/15/2007  DATE OF DISCHARGE:  03/16/2007                               DISCHARGE SUMMARY   PROCEDURES:  Adenosine Myoview.   PRIMARY FINAL DISCHARGE DIAGNOSES:  1. Paroxysmal atrial fibrillation with rapid ventricular response.  2. Chest pain in the setting of rapid atrial fibrillation, cardiac      enzymes negative for MI and Myoview low risk.  3. Hypertension.  4. Anxiety.  5. Crohn disease.  6. Blindness in the left eye.  7. ALLERGY OR INTOLERANCE TO NSAIDS, __________, CIPRO, SULFA.  8. History of basal cell skin carcinoma, status post removal.  9. History of intestinal surgery secondary to Crohn disease.  10.Status post hysterectomy.  11.Family history of coronary artery disease in father and brother.  12.Ongoing tobacco use.   TIME OF DISCHARGE:  Thirty five minutes.   HOSPITAL COURSE:  [name] is a 75 year old female with no previous  history of coronary artery disease.  She was evaluated by Dr. Antoine Poche  for new onset atrial fibrillation.  She was started on Coumadin which  has been labile and followed by Dr. Kathi Der Office.  She had abdominal  bubbling and chest pressure and felt some palpitations.  She came to  the hospital where she was found to be in atrial fibrillation with a  rate of 112.  She was admitted for further evaluation and treatment.   During the night, she spontaneously converted to sinus rhythm.  Her  cardiac enzymes were negative for MI.  A lipid profile showed a total  cholesterol of 112, triglycerides 135, HDL 37, LDL 48.  Blood sugar was  131, magnesium 2.1 and INR on admission was 5.6.  Coumadin was held.   [name] had a Myoview on March 16, 2007.  The Myoview results  showed significant breast attenuation and an EF of 75%.  Because of the  breast attenuation, ischemia was difficult to assess, and there is a  small possibility of anterior ischemia, but the study overall is felt to  be low risk.   On March 16, 2007, [name] was ambulating without chest pain or  shortness of breath.  Her Cardizem was increased for better heart rate  and blood pressure control.  Her Coumadin was held on Tuesday night and  is to be held on Wednesday night as well.  Her INR 10:15 a.m. was 3.8.  A repeat INR 10:15 p.m. was 2.9.   Because she was ambulating without chest pain or shortness of breath and  her cardiac enzymes were negative and her Myoview was without  significant abnormality, she was considered stable for discharge with  outpatient follow-up arranged.   DISCHARGE INSTRUCTIONS:  Her activity level is to be increased  gradually.  She is to stick to a low sodium heart healthy diet.  She is  to get a Coumadin check  on Thursday at Dr. Kathi Der office.  She is to  follow up with Dr. Antoine Poche in Medicine on November 19 at 9:30 and see  Dr. Christell Constant as scheduled.   DISCHARGE MEDICATIONS:  1. Coumadin 5 mg, 1/2 tablet tonight and then per Dr. Kathi Der office.  2. Asacol 300 mg 1 tablet t.i.d.  3. Prevacid 30 mg a day.  4. Aspirin 81 mg a day.  5. Triamterene HCTZ 75/50 daily.  6. Vitamins as prior to admission.  7. Cardizem CD 120 mg a day.  8. Cardizem 30 mg t.i.d. p.r.n.  9. Xanax p.r.n.Theodore Demark, PA-C      Jesse Sans. Daleen Squibb, MD, [facility]  Electronically Signed    RB/MEDQ  D:  03/16/2007  T:  03/17/2007  Job:  [id number]   cc:   Ernestina Penna, M.D.

## 2010-10-14 NOTE — Assessment & Plan Note (Signed)
[facility]                            CARDIOLOGY OFFICE NOTE   NAME:[name], [name]                    MRN:          [id number]  DATE:06/08/2007                            DOB:          [birth year]    REASON FOR PRESENTATION:  Evaluate patient with atrial fibrillation.   HISTORY OF PRESENT ILLNESS:  The patient is 75 years old.  At the last  visit, I started digoxin on her, but she never got this prescription  filled and does not know what she did with it.  She says she has only  had 2 episodes of palpitations, on Thanksgiving and Christmas.  They  lasted for a few hours.  She goes and lies down, and they eventually go  away.  They are not particularly problematic.  She does not have any  presyncope or syncope.  She has had no chest pain or shortness of  breath.  She is unfortunately still smoking cigarettes.   PAST MEDICAL HISTORY:  1. Hypertension x5 years.  2. Paroxysmal atrial fibrillation.  3. Anxiety.  4. Crohn's disease.  5. Left eye blindness apparently from thromboembolism.  (I am not      entirely clear about this.)  6. Basal cell cancer, resected.  7. Multiple abdominal surgeries for  Crohn's.  8. Hysterectomy.   ALLERGIES:  SULFA, KEFLEX, CIPRO, NONSTEROIDALS.   MEDICATIONS:  1. Tylenol.  2. Asacol.  3. Prevacid 30 mg daily.  4. Potassium.  5. Multivitamins.  6. Coumadin  7. Cardizem 120 mg.   REVIEW OF SYSTEMS:  As stated in the HPI, negative for other systems.   PHYSICAL EXAMINATION:  GENERAL:  The patient is in no distress.  VITAL SIGNS:  Blood pressure 150/70, heart rate 63 and regular.  Weight  234 pounds, body mass index 46.  HEENT:  Eyelids unremarkable.  Pupils equal, round, and reactive to  light.  Fundi not visualized.  NECK:  No jugular venous distention at 45 degrees.  Carotid upstrokes  brisk and symmetric,  no bruits, no thyromegaly.  LYMPHATICS:  No cervical, axillary, or inguinal adenopathy.  LUNGS:   Clear to auscultation bilaterally.  BACK:  No costovertebral angle tenderness.  CHEST:  Unremarkable.  HEART:  PMI not displaced or sustained.  S1 and S2 within normal limits.  No S3, no S4, no clicks, no rubs, no murmurs.  ABDOMEN:  Obese, positive bowel sounds normal in frequency and pitch.  No bruits, rebound, guarding or midline pulsatile mass.  No  organomegaly.  SKIN:  No rash, no nodules.  EXTREMITIES:  2+ pulses, no edema.   ASSESSMENT AND PLAN:  1. Evaluation of patient with paroxysms of atrial fibrillation that      are relatively short lived and not particularly symptomatic.  She      never got the digoxin filled, and I think it is reasonable for her      to stay off of it since she is doing well.  She will remain on the      Coumadin.  She understands the risks and benefits and agrees to  proceed.  She can come off the Coumadin as needed if she is to have      any surgery as she is thinking of having some skin lesions removed.  2. Tobacco.  We again talked about the need to stop smoking.  She will      consider the Chantix.  (Greater than 3 minutes.)  3. Obesity.  We talked need to lose weight with diet and exercise.  4. Hypertension.  Blood pressure is slightly elevated.  She takes      Maxzide p.r.n.  I suggested she take 1/2 Maxzide every day for      greater blood pressure control.  5. Followup.  I will see her back in 1 year or sooner if needed.     Rollene Rotunda, MD, [facility]  Electronically Signed    JH/MedQ  DD: 06/08/2007  DT: 06/08/2007  Job #: [id number]   cc:   Ernestina Penna, M.D.

## 2010-10-14 NOTE — Assessment & Plan Note (Signed)
[facility]                            CARDIOLOGY OFFICE NOTE   NAME:[name], [name]                    MRN:          [id number]  DATE:04/20/2007                            DOB:          1936-03-11    PRIMARY:  Dr. Vernon Prey.   REASON FOR PRESENTATION:  Evaluate patient with atrial fibrillation.   HISTORY OF PRESENT ILLNESS:  The patient is 75 years old.  She was  hospitalized, since I last saw her, with chest discomfort.  She was in  atrial fibrillation at that time and spontaneously converted to sinus  rhythm.  Her INR was supratherapeutic at 5.6 but it was 2.9 when she  left apparently the next morning.  She did have a Cardiolite which  demonstrated an EF of 75%, no evidence of ischemia or infarct.  She has  had one episode of her blood pressure rising and perhaps her heart  racing at home.  She laid down on the couch.  This seemed to pass  spontaneously but it lasted for several minutes.  Again, it is not  entirely clear that this was a paroxysm of atrial fibrillation, but I  suspect this.  Other than that, she has had no chest discomfort, neck or  arm discomfort.  She has had no presyncope or syncope.  She has had no  new shortness of breath, denies any PND or orthopnea.  She has not been  particularly active, being afraid to induce any symptoms.   PAST MEDICAL HISTORY:  1. Hypertension x5 years.  2. Anxiety.  3. Crohn's disease.  4. 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  5. Basal cell cancer resected.  6. Multiple abdominal surgeries for Crohn's.  7. Hysterectomy.   ALLERGIES:  1. SULFA.  2. KEFLEX.  3. CIPRO.  4. NONSTEROIDALS.   MEDICATIONS:  1. Tylenol.  2. Asacol.  3. Prevacid 30 mg daily.  4. Potassium.  5. Multivitamin.  6. Vitamin C.  7. Coumadin.  8. Cardizem 120 mg daily.   REVIEW OF SYSTEMS:  As stated in the HPI and otherwise negative for  other systems.   PHYSICAL EXAMINATION:  The patient is in no distress.  Blood pressure  150/72, heart rate 60 and regular, weight 235 pounds, body mass index  45.  HEENT:  Eyelids unremarkable, pupils equal, round, react to light, fundi  not visualized, oral mucosa unremarkable.  NECK:  No jugular venous distention at 45 degrees.  Carotid upstroke  brisk and symmetric, no bruits, no thyromegaly.  LYMPHATICS:  No adenopathy.  LUNGS:  Clear to auscultation bilaterally.  BACK:  No costovertebral angle tenderness.  CHEST:  Unremarkable.  PMI not displaced or sustained, S1 and S2 within  normal limits, no S3, no S4, no clicks, no rubs, no murmurs.  ABDOMEN:  Morbidly obese, positive bowel sounds normal in frequency and  pitch, no bruits, no rebound, no guarding, no midline pulse, no mass, no  hepatomegaly, no splenomegaly.  SKIN:  No rashes, no nodules other than scaly lesions demonstrated  chronically.  NEURO:  Oriented to person, place and time, cranial nerves II through  XII grossly intact, motor grossly intact.  EXTREMITIES:  2+ pulses, trace bilateral lower extremity edema.   EKG:  Sinus bradycardia, rate 51, axis within normal limits, intervals  within normal limits, no acute ST-T wave changes.   ASSESSMENT AND PLAN:  1. Atrial fibrillation.  The patient is having paroxysms of atrial      fibrillation.  At this point, I am going to leave her on the      Cardizem.  Her heart rate goes up easily with ambulation.  I do      think she will tolerate some digoxin 0.25 milligrams daily, though      will need to see if she has any significant bradyarrhythmias.  She      will let me know if she has any problems with lightheadedness,      presyncope or syncope.  She is going to maintain on the Coumadin.      I talked with her at great length about this and did have to access      the computer and review all of her notes from a recent      hospitalization.  2. Tobacco.  She understands the need to stop  smoking, we have      discussed this.  3. Hypertension.  Blood pressure is slightly elevated today.      Certainly, she needs therapeutic lifestyle changes.  She may need      the addition of a low-dose diuretic.  I am trying to work with her      to keep a blood pressure diary at home.  4. Obesity.  She understands the need to lose weight with diet and      exercise and I have prescribed exercise for her.   FOLLOWUP:  I will see her back in about 6 weeks or sooner if needed.     Rollene Rotunda, MD, [facility]  Electronically Signed   JH/MedQ  DD: 04/20/2007  DT: 04/20/2007  Job #: [id number]   cc:   Ernestina Penna, M.D.

## 2010-10-17 NOTE — Op Note (Signed)
NAME:  [name]                       ACCOUNT NO.:  [id number]   MEDICAL RECORD NO.:  [id number]                   PATIENT TYPE:  AMB   LOCATION:  DAY                                  FACILITY:  [facility]   PHYSICIAN:  Lionel December, M.D.                 DATE OF BIRTH:  [birth year]   DATE OF PROCEDURE:  01/09/2003  DATE OF DISCHARGE:                                 OPERATIVE REPORT   PROCEDURE:  Esophagogastroduodenoscopy with esophageal dilatation followed  by total colonoscopy with dilatation of the rectal and ileocolic anastomotic  stricture.   INDICATIONS FOR PROCEDURE:  [name] is a 75 year old Caucasian female with  solid food dysphagia.  She has chronic GERD complicated by esophageal  stricture which was last dilated in October, 2001.  She also complains of  abdominal pain, nausea, difficult defecation, and change in the caliber of  her stools.  It is therefore suspected that her strictures are closing up  again.  She is undergoing therapeutic colonoscopy.  The procedure was reviewed with the patient, and informed consent was  obtained.   PREOPERATIVE MEDICATIONS:  Cetacaine spray for pharyngeal topical  anesthesia, Demerol 50 mg IV, Versed 8 mg IV in divided doses.   FINDINGS:  The procedure was performed in the endoscopy suite.  The  patient's vital signs and O2 saturations were monitored during the procedure  and remained stable.   PROCEDURE #1:  Esophagogastroduodenoscopy.  The patient was placed in the  left lateral recumbent position, and the Olympus videoscope was passed via  the oropharynx without any difficulty into the esophagus.   Esophagus:  The mucosa was normal.  There was a tubular stricture at the  distal esophagus proximal to the GE junction.  The scope was easily passed  across it.  The gastroesophageal junction was unremarkable.   Stomach:  It was empty and distended very well with insufflation.  The folds  of the proximal stomach were normal.   Examination of  the mucosa at the  body, antrum, pyloric channel, as well as angularis, and fundus was normal.   Duodenum:  Examination of the bulb revealed mucosal edema and a bulbar  stricture which was dilated by passing the scope through it.  Since there  was no food debris or dilated stomach, I opted not to dilate this stricture  any further.  The mucosa and folds of the second part of the duodenum were  normal.  The endoscope was withdrawn.   The esophagus was dilated by passing a 54 [place] Maloney dilator to full  insertion.  The endoscope was passed again, and there was  a small tear in  the cervical esophagus and a moderate-sized tear at the distal esophagus.  Pictures were taken for the record.  The endoscope was withdrawn and the  patient prepared for procedure #2.   PROCEDURE #2:  Total colonoscopy.  Rectal examination was performed.  There  was a distal rectal or anorectal stricture.  This was initially dilated on  digital exam and subsequently with a pediatric Olympus videoscope.  Examination of the rectum revealed multiple erosions and small ulcers.  The  scope was advanced to the sigmoid colon and beyond.  The preparation was  excellent.  The scope was advanced to the area of the anastomosis which was  felt to be in the region of the transverse colon.  The stricture was high-  grade.  I could not pass the scope through it.  The mucosa was friable, but  it was a short stricture.  I could see the mucosa on the other side.  There  was a large ulcer distal to this stricture.  Endoscopic appearance was  typical of Crohn's disease.  This stricture was dilated with a balloon,  initially  1. and then to 11 and 12 mm.  Post-dilatation, I was able to advance the     pediatric scope through the stricture into the small bowel which was     normal.  As the scope was withdrawn, the colonic mucosa was once again     carefully examined and was normal.  The rectal mucosa revealed  multiple     erosions and small ulcerations.  The anorectal junction was well-examined     on retroflexion view.  The rectal stricture was then dilated with the     cervical dilators up to size 18.  The patient tolerated the procedures     well.   FINAL DIAGNOSES:  1. Multiple strictures involving the gastrointestinal tract, as below.  2. Esophageal stricture at distal esophagus and cervical esophagus dilated     by passing a 54 [place] Maloney dilator.  3. Bulbar stricture possibly due to previous peptic ulcer disease but could     be Crohn's disease, but there was no ulceration.  The stricture was     dilated by passing the scope across it.  I decided not to balloon dilate     it, as the patient does not have any food in her stomach or symptoms of     partial outlet obstruction.  4. Rectal ulcers with distal stricture which was dilated to 18 size with     cervical dilators.  5. Ileocolic anastomotic stricture dilated to 12 mm with the balloon.  6. Ulceration noted distal to the ileocolic stricture as well as the rectum     secondary to Crohn's disease.   RECOMMENDATIONS:  1. She will continue her usual medications.  She will stay on clear liquids     today and advance her diet to usual diet.  2. She will return for office visit in three to four months from now.                                               Lionel December, M.D.    NR/MEDQ  D:  01/09/2003  T:  01/09/2003  Job:  [id number]   cc:   Ernestina Penna, M.D.  [address] [place]  [place] [address]  Fax: [phone]

## 2010-10-17 NOTE — H&P (Signed)
NAME:  [name], [name]                      ACCOUNT NO.:  [id number]   MEDICAL RECORD NO.:  [id number]                   PATIENT TYPE:  AMB   LOCATION:                                       FACILITY:  [facility]   PHYSICIAN:  Lionel December, M.D.                 DATE OF BIRTH:  Apr 27, 1936   DATE OF ADMISSION:  01/09/2003  DATE OF DISCHARGE:                                HISTORY & PHYSICAL   PRESENTING COMPLAINT:  Followup for GERD, Crohn's disease, dysphagia,  increasing abdominal pain and difficult defecation.   HISTORY OF PRESENT ILLNESS:  [name] is a 75 year old Caucasian female with  chronic GERD complicated by esophageal stricture whose last dilatation was  in October 2001, has a past history of colonic and anorectal Crohn's disease  who presents for a scheduled visit.  She was last seen in February 2004.  She states she is not doing well.  She is having solid food dysphagia.  If  she stays calm and eats slowly she generally has one episode; otherwise, she  may have multiple episodes.  She states her heartburn is well controlled  with diet and medication.  She denies hoarseness, chronic cough.  She states  she has a good appetite.  She also complains of mid abdominal pain.  She  feels the pain gets worse with some of the medications.  She is having  difficulty with her bowel movements.  She generally has three BMs all in the  morning.  She has noted decreasing caliber of her stool.  She feels that her  stricture is getting tight again.  She denies melena or rectal bleeding.  She says her appetite is very good and she has not lost any weight since the  last visit.  She said she has lost vision in her left eye and she only has  perception of light.  She had some laser surgery.  She apparently had  hemorrhages, some kind of vascular process and was treated by Dr. Jerolyn Center in  [place].   MEDICATIONS:  1. She is on Xanax __________  mg once or twice daily.  2. Asacol 800 mg  b.i.d.  3. Prevacid 30 mg q.a.m.  4. Maxzide p.r.n.  5. Potassium OTC p.r.n.  6. Darvocet-N 100 p.r.n. which she takes occasionally.  7. MVI daily.  8. Vitamin daily.  9. Tylenol p.r.n.   PAST MEDICAL HISTORY:  1. She has Crohn's disease of seven years duration.  2. She has had part of her colon taken out.  3. She has anorectal and ileocolic anastomotic stricture.  Both of these     strictures were dilated most recently in October 2001.  Distal tubular     esophageal stricture was dilated to 18-mm with a balloon.  Ileocolic     anastomotic stricture was dilated to 10-mm with a balloon and anorectal     or anal stenosis was dilated to  17-mm using cervical dilators.  She felt     a lot better following this intervention.  4. She has a history of gastritis and duodenitis but her CLOtest was     negative at that time.  5. She has chronic GERD complicated by esophageal stricture and has     undergone multiple dilatations, two of which were local and prior to that     these were in [place].  6. She has a history of kidney stones and had right nephrolithotomy.  7. She has had a hysterectomy with the removal of one ovary.  8. She has had intestinal resection three different times.  Her last surgery     was 17-18 years ago.  9. She also has had C-section x 2.  10.      Decompression of carpal tunnel bilaterally.  11.      Cholecystectomy.  12.      She is blind in the left eye.  She has 20/25 vision in her right     eye.  She had a cataract in March 2004.   ALLERGIES:  None known.   FAMILY HISTORY:  Negative for colorectal carcinoma or inflammatory bowel  disease.   SOCIAL HISTORY:  She does not smoke cigarettes or drink alcohol.   PHYSICAL EXAMINATION:  GENERAL:  A pleasant, moderately obese, Caucasian  female who is in no acute distress.  VITAL SIGNS:  She weighs 232.5 pounds.  She is 5 feet tall.  Pulse 68 per  minute, blood pressure 150/72.  HEENT:  Conjunctivae pink.   Sclera nonicteric.  Oropharyngeal mucosa is  normal.  She has upper and lower dentures in place.  NECK:  Without masses or thyromegaly.  CARDIAC:  Regular rhythm.  Normal S1 and S2.  No murmur or gallop noted.  LUNGS:  Clear to auscultation.  ABDOMEN:  Obese.  She has a pigmented area to the right of the umbilicus  possibly a birth mark.  Abdomen is soft.  She has some mild tenderness at  the LLQ.  No organomegaly or masses noted.  RECTAL:  Deferred.  EXTREMITIES:  She has trace edema, pitting edema around her ankles.   ASSESSMENT:  1. [name] presented with solid food dysphagia which she has experienced     fairly frequently.  She has known esophageal strictures secondary to     chronic gastroesophageal reflux disease.  This was last dilated over two     years ago.  I feel this needs to be re-dilated.  2. She also is having increasing abdominal pain and difficult defecation     with a decrease in caliber of her stools.  I feel that both her anorectal     and ileocolonic anastomotic strictures need to be dilated.   PLAN:  She will continue antireflux measures Prevacid and Asacol as before.  She was given samples of Prevacid Solu Tab along with a coupon.  She was  also given Prevacid 30 mg samples.  She will undergo EGD with ED and  colonoscopy with dilatation of her strictures in near future.  I have  reviewed the procedures with the patient and she is agreeable.                                               Lionel December, M.D.   NR/MEDQ  D:  12/19/2002  T:  12/19/2002  Job:  [id number]   cc:   Ernestina Penna, M.D.  [address] [place]  [place] [address]  Fax: [phone]

## 2010-10-17 NOTE — H&P (Signed)
[name]:  [name], [name]      ACCOUNT NO.:  [id number]   MEDICAL RECORD NO.:  [id number]           PATIENT TYPE:  AMB   LOCATION:                                 FACILITY:   PHYSICIAN:  Lionel December, M.D.    DATE OF BIRTH:  1935-06-15   DATE OF ADMISSION:  DATE OF DISCHARGE:  LH                                HISTORY & PHYSICAL   CHIEF COMPLAINT:  Crohn's disease and history of esophageal stricture.   HISTORY OF PRESENT ILLNESS:  [name] is a 75 year old Caucasian female  with a 30 year history of anorectal and ileocolonic anastomotic stricture.  Both of these strictures were most recently dilated in August of 2004.  She  also had a bulbar stricture as well. She presents today requesting re-  dilatation.  She complains of a substernal and epigastric sensation of her  food feeling stuck.  She notes over the last two to three months worsening  of her symptoms.  She is also complaining of some intermittent abdominal  bloating, fleeting abdominal pain, and change in caliber of her stools.  She  is, however, continuing to have loose to semi-formed stools three to four  times a day, generally.  She denies any fever or chills.  Her weight has  remained stable. Her appetite is good.  She is having solid food dysphagia.  She notes that she feels her strictures are getting tight again.   PAST MEDICAL HISTORY:  History of anorectal and ileocolonic Crohn's disease  with history of multiple strictures  involving bulbar stricture, rectal  ulcers with distal stricture, ileocolonic anastomotic stricture.  She also  has history of esophageal stricture with last procedure January 09, 2003 by  Dr. Lionel December.  She has had partial colectomy, history of gastritis and  duodenitis with a negative CLO-test.  She has had renolithiasis and right  nephrolithotomy, hysterectomy and unilateral oophorectomy, intestinal  resection X3, Cesarean section X2, carpal tunnel repair bilaterally,  cholecystectomy.  She has left eye blindness, cataract surgery in March  2004, hypertension and chronic obstructive pulmonary disease.   CURRENT MEDICATIONS:  1.  Xanax 0.5 mg daily.  2.  Asacol 400 mg two tablets t.i.d.  3.  Prevacid 30 mg daily.  4.  Maxzide every other day.  5.  Tylenol PRN.  6.  Kay-Ciel PRN.  7.  Multivitamin daily.  8.  Vitamin C and D daily.   ALLERGIES:  No known drug allergies.   FAMILY HISTORY:  Negative for inflammatory bowel disease or colorectal  carcinoma.   SOCIAL HISTORY:  She has been a life-long smoker reporting consumption of  about one-half pack per day. She denies any alcohol or drug use.   PHYSICAL EXAMINATION:  VITAL SIGNS:  Weight 232 pounds, height 60 inches,  temperature 97.9, blood pressure 132/80, pulse 56.  GENERAL APPEARANCE:  [name] is a 75 year old Caucasian female who is  alert, oriented, pleasant, cooperative, in no acute distress.  SKIN:  She does have multiple skin tags to her facial area as well as trunk.  No rashes or jaundice.  HEENT:  Sclerae nonicteric.  Conjunctiva clear.  Oropharynx clear with no  lesions.  NECK:  Supple without masses or thyromegaly.  CARDIOVASCULAR:  Heart with regular rate and rhythm with normal S1 and S2  without murmurs, rubs, gallops.  LUNGS:  Clear to auscultation bilaterally.  ABDOMEN:  Obese with positive bowel sounds X4.  No bruits auscultated.  Soft, nontender, nondistended without palpable mass or hepatosplenomegaly.  No rebound tenderness or guarding.  EXTREMITIES:  Trace bilateral lower extremity edema.   ASSESSMENT:  [name] is a 75 year old Caucasian female with recurrent  solid food dysphagia with history of esophageal stricture secondary to  chronic gastroesophageal reflux disease.  She is on daily proton pump  inhibitor therapy.  She feels she needs to be re-dilated.   She also has a history of Crohn's disease with multiple strictures as noted  above.  She is having  some change in caliber of her stools, abdominal  bloating and sensations that she needs to be re-dilated.   RECOMMENDATIONS:  1.  Will schedule colonoscopy and esophagogastroduodenoscopy with Dr. Karilyn Cota      in the near future.  I have discussed both procedures including risks      and benefits which include, but are not limited to, bleeding,      perforation, infection, drug reaction.  She agrees with plan and consent      will be obtained.  She should continue multivitamin with folic acid on a      daily basis.  2.  Will request labs as she has had recent blood work through [facility].      KC/MEDQ  D:  09/17/2004  T:  09/17/2004  Job:  [id number]   cc:   Ernestina Penna, M.D.  [address] [place]  [place] [address]  Fax: [phone]

## 2010-10-17 NOTE — Op Note (Signed)
NAME:  [name], [name]             ACCOUNT NO.:  [id number]   MEDICAL RECORD NO.:  [id number]          PATIENT TYPE:  AMB   LOCATION:  DAY                           FACILITY:  [facility]   PHYSICIAN:  Lionel December, M.D.    DATE OF BIRTH:  04-Aug-1935   DATE OF PROCEDURE:  10/01/2004  DATE OF DISCHARGE:                                 OPERATIVE REPORT   PROCEDURE:  Esophagogastroduodenoscopy with esophageal dilation followed by  colonoscopy with balloon dilation of ileocolic stricture as well as dilation  of anorectal stricture.   INDICATIONS:  The patient is 75 year old Caucasian female who has recurrent  solid food dysphagia. She has chronic GERD and has had esophageal stricture  dilated on multiple occasions. The last one was in August 2004. She also is  complaining of bloating, change in caliber of her stools and abdominal pain.  Symptoms are suggestive of recurrence of her anastomotic stricture. She is  therefore undergoing therapeutic colonoscopy. Procedure risks were reviewed  with the patient, and informed consent was obtained.   PREMEDICATION:  Cetacaine spray for pharyngeal topical anesthesia, Demerol  50 mg IV, Versed 10 mg IV.   FINDINGS:  Procedure was performed in endoscopy suite. The patient's vital  signs and O2 saturation were monitored during procedure and remained stable.   PROCEDURE #1:  Esophagogastroduodenoscopy. The patient was placed left  lateral position and Olympus videoscope was passed via oropharynx without  any difficulty into esophagus.   Esophagus. Mucosa of the esophagus was normal. There was distal esophageal  stricture without ulcers or esophageal stricture marked by a scar resulting  from previous dilation. GE junction was at 40 cm from the incisors.   Stomach. It was empty and distended very well insufflation. Folds of  proximal stomach were normal. Mucosa at body, antrum, pyloric channel as  well as angularis, fundus and cardia was normal.   Duodenum. There was erythema and edema of the bulbar mucosa with narrowing.  This was dilated with the scope as was the case on last procedure.  Postbulbar mucosa and folds were normal. Endoscope was withdrawn.   Esophagus was dilated by passing 54-French Maloney dilator to full  insertion. As the dilator was withdrawn, endoscope was passed again, and  there were three tears; the most prominent one was distally and a small  hematoma above that. There was a smaller tear mid esophagus and one at  cervical esophagus. Pictures taken for the record. Endoscope was withdrawn.  The patient was fair for procedure #2.   PROCEDURE #2:  Colonoscopy. Rectal examination reveals anorectal stricture.  This was initially dilated with the scope. Olympus videoscope was dilated on  a digital exam and subsequently by passing the scope across it. Pediatric  scope was used. Rectal mucosa was normal. Scope was retroflexed to examine  anorectal junction where there were a few erosions and a stricture. This was  felt to be noncritical. Scope was straightened and advanced in sigmoid colon  and descending colon. There were multiple small ulcers at anastomosis. Lumen  was 5 to 6 mm. The scope could not be passed through it. I  could see the  mucosa above the stricture. The stricture was dilated to 10 mm then to 12  mm. There was some oozing but no brisk bleeding. Subsequently, I was able to  pass the scope proximal to stricture. There were few small ulcers involving  distal small bowel mucosa just proximal to stricture. Mucosa above that was  normal. As the scope was withdrawn, colonic mucosa was once again carefully  examined. There are no other lesions. Scope was withdrawn. Anorectal  stricture was dilated with the cervical dilators to a diameter of 16 mm.   The patient tolerated the procedures well.   FINAL DIAGNOSIS:  1.  Distal esophageal stricture which was dilated by passing 54-French      Maloney dilator  which also resulted in small tears of cervical esophagus      and mid esophagus.  2.  Bulbar duodenitis with a stricture which was dilated with the scope. I      suspect she also has duodenal Crohn's disease. H pylori serology will be      checked unless she has had one in the past. Will review office records.      Her CLO-test in the past has been negative. H pylori serology be      checked.  3.  Anorectal stricture secondary to Crohn's disease. Anorectal stricture      was dilated to 16-mm with a cervical dilator.  4.  High-grade anastomotic ileocolic stricture with multiple ulcers this      area. The stricture was dilated to 12 mm.   RECOMMENDATIONS:  1.  She will continue anti-reflux measures, Prevacid and Asacol as before.      The patient advised to take Asacol 800 mg t.i.d. and try not to skip a      dose.  2.  H pylori serology.      NR/MEDQ  D:  10/01/2004  T:  10/01/2004  Job:  [id number]   cc:   Ernestina Penna, M.D.  [address] [place]  [place] [address]  Fax: [phone]

## 2010-10-17 NOTE — Assessment & Plan Note (Signed)
[facility]                            CARDIOLOGY OFFICE NOTE   NAME:Erekson, ASHLEEN DEMMA                    MRN:          [id number]  DATE:08/18/2006                            DOB:          08-23-1935    PRIMARY CARE PHYSICIAN:  Ernestina Penna, MD   REASON FOR REFERRAL:  Evaluate patient with chest pain and hypertension.   HISTORY OF PRESENT ILLNESS:  This patient was seen in the past.  She had  palpitations.  She apparently wore a monitor at [facility] last year but  never had to press this apparent event monitor.  She has had  hypertension.  She has been taking triamterene hydrochlorothiazide but  not everyday.  She usually takes it if she has swelling or if her blood  pressure goes up.  She said that in late January she was taking her  blood pressure.  She noted it to be high.  She then got anxious and does  report that her mouth started tingling and her arm tingling.  She went  to [facility].  She was observed there.  She had an MRI which demonstrated  no acute abnormalities by her report.  She had no cardiac enzyme  elevations or EKG changes.  She was not admitted to the hospital.  She  said she had had a similar episode a year ago.  She said she did have  some mild chest tightness with this.  Otherwise, she gets around  somewhat limited by back and knee pain.  She is also limited by her  morbid obesity.  However, with her activities of daily living which  include vacuuming, she does not get chest pressure, neck or arm  discomfort.  She has a poor exercise tolerance and gets fatigued.  This  is not a new problem.  She has had no PND or orthopnea.  She rarely has  any palpitations, had no presyncope or syncope.   Of note, she did have a stress perfusion study in 1998, which was  negative for any evidence of ischemia.   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   PAST SURGICAL HISTORY:  Basal cell cancer removed, multiple abdominal  surgeries for her Crohn's, hysterectomy.   ALLERGIES:  She avoids NSAID's, Keflex caused diarrhea, Cipro caused  nausea, sulfa or the flu shot caused diarrhea.   MEDICATIONS:  1. Xanax  2. Triamterene hydrochlorothiazide 75/50 p.r.n.  3. Asacol  4. Multivitamin  5. Tylenol  6. Prevacid   SOCIAL HISTORY:  The patient is retired.  She is a widow.  She has two  children.  She has been smoking a half pack of cigarettes a day for 35  years.   FAMILY HISTORY:  Positive for myocardial infarction in father at age 7.  She had a brother with myocardial infarction who died at age 76.   REVIEW OF SYSTEMS:  Stated in the HPI.  Positive for reflux, joint  pains.  Negative for all other systems.   PHYSICAL EXAMINATION:  The patient is in no distress.  Blood pressure 168/72, heart 58 and regular.  Weight 235 pounds.  Body  mass index 46.  HEENT:  Eyelids unremarkable.  Right pupil reactive, left nonreactive.  Fundi right within normal limits, unable to visualize the left.  Oral  mucosa unremarkable.  NECK:  No jugular venous distention 45 degrees.  Carotid upstroke brisk  and symmetric.  No bruits, no thyromegaly.  LYMPHATICS:  No cervical, axillary, inguinal adenopathy.  LUNGS:  Clear to auscultation bilaterally.  BACK:  No costovertebral angle tenderness.  CHEST:  Unremarkable.  HEART:  PMI not displaced or sustained.  S1 and S2 within normal limits.  No S3, no S4.  No clicks, no rubs, no murmurs.  ABDOMEN:  Obese.  Positive bowel sounds, normal in frequency and pitch.  No bruits, rebound, guarding.  No midline pulsatile mass.  No  hepatomegaly or splenomegaly.  SKIN:  No rashes, diffuse nodular scaly lesions ( I am not sure of this  diagnosis).  NEUROLOGIC:  Oriented to person, place, and time.  Cranial nerves II-XII  grossly intact.  Motor grossly intact.   EKG sinus rhythm,  rate 58.  Axis within normal limits.  Intervals within  normal limits.  No acute ST-T wave changes.   ASSESSMENT AND PLAN:  1. Chest discomfort.  The patient has had some very vague chest      discomfort.  Her symptoms are atypical.  Though she has multiple      cardiovascular risk factors, I think the pretest probability of      obstructive coronary disease is low.  She almost definitely has      nonobstructive disease and because of her risk factors and      lifestyle, she is at high risk for future cardiovascular events.      However, I do not believe that stress perfusion imaging is      warranted.  I actually think it would be more problematic given her      size and body habitus.  The possibility of false positive is      extremely high.  Therefore, I would concentrate instead on primary      risk reduction as described below.  2. Hypertension.  Blood pressure is elevated.  Certainly, she needs      weight loss.  I have asked her to take her Maxzide everyday.      Because she had a somewhat low potassium earlier this year, she is      going to get a BMET in a week at Dr. Kathi Der office.  She is      instructed to increase her potassium containing foods.  3. Obesity.  I have advised Weight Watchers diet.  This is a life-      limiting problem for this patient.  4. Tobacco.  The patient needs to stop smoking and she understands      this.  She should talk      with Dr. Christell Constant about Chantix.  5. Followup.  She should come back to this clinic as needed.     Rollene Rotunda, MD, [facility]  Electronically Signed    JH/MedQ  DD: 08/18/2006  DT: 08/18/2006  Job #: [id number]

## 2010-11-11 ENCOUNTER — Ambulatory Visit: Payer: Medicare Other | Attending: Orthopedic Surgery | Admitting: Physical Therapy

## 2010-11-11 DIAGNOSIS — M545 Low back pain, unspecified: Secondary | ICD-10-CM | POA: Insufficient documentation

## 2010-11-11 DIAGNOSIS — R293 Abnormal posture: Secondary | ICD-10-CM | POA: Insufficient documentation

## 2010-11-11 DIAGNOSIS — R5381 Other malaise: Secondary | ICD-10-CM | POA: Insufficient documentation

## 2010-11-11 DIAGNOSIS — IMO0001 Reserved for inherently not codable concepts without codable children: Secondary | ICD-10-CM | POA: Insufficient documentation

## 2010-11-18 ENCOUNTER — Ambulatory Visit: Payer: Medicare Other | Admitting: Physical Therapy

## 2010-11-19 ENCOUNTER — Encounter: Payer: Medicare Other | Admitting: Physical Therapy

## 2010-12-04 ENCOUNTER — Ambulatory Visit: Payer: Medicare Other | Admitting: Cardiology

## 2011-01-06 ENCOUNTER — Encounter: Payer: Self-pay | Admitting: Cardiology

## 2011-01-07 ENCOUNTER — Encounter: Payer: Self-pay | Admitting: Cardiology

## 2011-02-01 ENCOUNTER — Other Ambulatory Visit: Payer: Self-pay | Admitting: Cardiology

## 2011-02-16 ENCOUNTER — Encounter: Payer: Self-pay | Admitting: Cardiology

## 2011-02-18 ENCOUNTER — Ambulatory Visit (INDEPENDENT_AMBULATORY_CARE_PROVIDER_SITE_OTHER): Payer: Medicare Other | Admitting: Cardiology

## 2011-02-18 ENCOUNTER — Encounter: Payer: Self-pay | Admitting: Cardiology

## 2011-02-18 DIAGNOSIS — I4891 Unspecified atrial fibrillation: Secondary | ICD-10-CM

## 2011-02-18 DIAGNOSIS — E119 Type 2 diabetes mellitus without complications: Secondary | ICD-10-CM

## 2011-02-18 DIAGNOSIS — R609 Edema, unspecified: Secondary | ICD-10-CM

## 2011-02-18 DIAGNOSIS — I10 Essential (primary) hypertension: Secondary | ICD-10-CM

## 2011-02-18 DIAGNOSIS — F17200 Nicotine dependence, unspecified, uncomplicated: Secondary | ICD-10-CM

## 2011-02-18 DIAGNOSIS — E663 Overweight: Secondary | ICD-10-CM

## 2011-02-18 DIAGNOSIS — Z720 Tobacco use: Secondary | ICD-10-CM

## 2011-02-18 NOTE — Assessment & Plan Note (Signed)
We have talked many times about the need to stop smoking and she has not been able to do this.  She understands how important this is and I continue to educate.

## 2011-02-18 NOTE — Assessment & Plan Note (Signed)
The blood pressure is at target. No change in medications is indicated. We will continue with therapeutic lifestyle changes (TLC).  

## 2011-02-18 NOTE — Assessment & Plan Note (Signed)
Unfortunately she has continued to gain weight though some of this is likely fluid.  We have talked many times about the need to reduce calories and increase activity.

## 2011-02-18 NOTE — Assessment & Plan Note (Signed)
Certainly some of this is related to her weight and the fact that her feet are on the ground most of the day when she is seated. She is also sedentary. However, there could be a component of heart failure.  I will start with an echocardiogram.

## 2011-02-18 NOTE — Patient Instructions (Addendum)
Your physician has requested that you have an echocardiogram. Echocardiography is a painless test that uses sound waves to create images of your heart. It provides your doctor with information about the size and shape of your heart and how well your heart's chambers and valves are working. This procedure takes approximately one hour. There are no restrictions for this procedure.  Your physician has recommended that you wear a holter monitor. Holter monitors are medical devices that record the heart's electrical activity. Doctors most often use these monitors to diagnose arrhythmias. Arrhythmias are problems with the speed or rhythm of the heartbeat. The monitor is a small, portable device. You can wear one while you do your normal daily activities. This is usually used to diagnose what is causing palpitations/syncope (passing out).  The current medical regimen is effective;  continue present plan and medications.  Please follow up with Dr Antoine Poche in [place] 03/11/11 at 2:30pm

## 2011-02-18 NOTE — Progress Notes (Signed)
HPI The patient presents for followup of her atrial fibrillation and lower extremity edema. Since I last saw her she was in the emergency room for evaluation of leg pain. She reports a Doppler demonstrating no DVT. She has had increasing lower extremity edema left greater than right. She's finding is uncomfortable. She does not describe any new shortness of breath, PND or orthopnea. She is not describing new chest pressure, neck or arm discomfort. I do note a 25 pound weight gain since I last saw her. She is not noticing any palpitations, presyncope or syncope. She does have a mild nonproductive cough.   Allergies  Allergen Reactions  . Sulfonamide Derivatives     REACTION: Felt funny    Current Outpatient Prescriptions  Medication Sig Dispense Refill  . acetaminophen (TYLENOL) 500 MG tablet Take 500 mg by mouth 2 (two) times daily as needed.        . ALPRAZolam (XANAX) 0.5 MG tablet Take 0.5 mg by mouth as needed.        . Cyanocobalamin (VITAMIN B-12 IJ) Inject as directed every 30 (thirty) days.        Marland Kitchen diltiazem (CARDIZEM CD) 180 MG 24 hr capsule TAKE ONE CAPSULE BY MOUTH ONE TIME DAILY  30 capsule  5  . gabapentin (NEURONTIN) 300 MG capsule Take 100 mg by mouth daily.       . lansoprazole (PREVACID) 15 MG capsule Take 15 mg by mouth daily.        . Mesalamine (ASACOL HD) 800 MG TBEC Take 1 tablet by mouth 2 (two) times daily.        . metFORMIN (GLUCOPHAGE) 1000 MG tablet Take 1,000 mg by mouth 2 (two) times daily with a meal.        . Multiple Vitamin (MULTIVITAMIN) tablet Take 1 tablet by mouth daily.        Marland Kitchen OVER THE COUNTER MEDICATION 1 tablet daily. VITAMIN D 500 IU       . potassium chloride (KLOR-CON) 10 MEQ CR tablet Take 10 mEq by mouth 2 (two) times a week.        . triamterene-hydrochlorothiazide (MAXZIDE) 75-50 MG per tablet Take 1 tablet by mouth daily.        . vitamin C (ASCORBIC ACID) 500 MG tablet Take 500 mg by mouth daily.        Marland Kitchen warfarin (COUMADIN) 1 MG tablet  Take 1 mg by mouth as directed.          Past Medical History  Diagnosis Date  . Hypertension     x 5 years  . Paroxysmal atrial fibrillation   . Anxiety   . Crohn's disease   . 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 No clear documentation of this  . Diabetes mellitus     Past Surgical History  Procedure Date  . Basal cell carcinoma excision   . Abdominal surgery     multiple  . Vesicovaginal fistula closure w/ tah   . Cancers resected     ROS:  As stated in the HPI and negative for all other systems.  PHYSICAL EXAM BP 132/80  Pulse 103  Resp 16  Ht 4\' 11"  (1.499 m)  Wt 222 lb (100.699 kg)  BMI 44.84 kg/m2 GENERAL:  Well appearing HEENT:  Pupils equal round and reactive, fundi not visualized, oral mucosa unremarkable, dentures NECK:  JVD to jaw at 45 degrees, waveform within normal limits, carotid upstroke brisk and symmetric, no  bruits, no thyromegaly LYMPHATICS:  No cervical, inguinal adenopathy LUNGS:  Clear to auscultation bilaterally BACK:  No CVA tenderness CHEST:  Unremarkable HEART:  PMI not displaced or sustained,S1 and S2 within normal limits, no S3, no S4, no clicks, no rubs, no murmurs, irregular ABD:  Flat, positive bowel sounds normal in frequency in pitch, no bruits, no rebound, no guarding, no midline pulsatile mass, no hepatomegaly, no splenomegaly, obese EXT:  2 plus pulses throughout, severe nonpitting with some pitting edema left greater than right, no cyanosis no clubbing SKIN:  No rashes no nodules NEURO:  Cranial nerves II through XII grossly intact, motor grossly intact throughout PSYCH:  Cognitively intact, oriented to person place and time  EKG:  Atrial fibrillation, low voltage, no acute ST T wave changes  ASSESSMENT AND PLAN

## 2011-02-18 NOTE — Assessment & Plan Note (Signed)
She tolerates Coumadin.  I will check an Holter to make sure she has reasonable rate control.

## 2011-03-04 ENCOUNTER — Encounter (INDEPENDENT_AMBULATORY_CARE_PROVIDER_SITE_OTHER): Payer: Medicare Other | Admitting: *Deleted

## 2011-03-04 ENCOUNTER — Other Ambulatory Visit (INDEPENDENT_AMBULATORY_CARE_PROVIDER_SITE_OTHER): Payer: Medicare Other | Admitting: *Deleted

## 2011-03-04 DIAGNOSIS — I4891 Unspecified atrial fibrillation: Secondary | ICD-10-CM

## 2011-03-04 DIAGNOSIS — R609 Edema, unspecified: Secondary | ICD-10-CM

## 2011-03-11 ENCOUNTER — Encounter: Payer: Self-pay | Admitting: Cardiology

## 2011-03-11 ENCOUNTER — Ambulatory Visit (INDEPENDENT_AMBULATORY_CARE_PROVIDER_SITE_OTHER): Payer: Medicare Other | Admitting: Cardiology

## 2011-03-11 DIAGNOSIS — Z720 Tobacco use: Secondary | ICD-10-CM

## 2011-03-11 DIAGNOSIS — F17200 Nicotine dependence, unspecified, uncomplicated: Secondary | ICD-10-CM

## 2011-03-11 DIAGNOSIS — I10 Essential (primary) hypertension: Secondary | ICD-10-CM

## 2011-03-11 DIAGNOSIS — E663 Overweight: Secondary | ICD-10-CM

## 2011-03-11 DIAGNOSIS — R609 Edema, unspecified: Secondary | ICD-10-CM

## 2011-03-11 DIAGNOSIS — I4891 Unspecified atrial fibrillation: Secondary | ICD-10-CM

## 2011-03-11 LAB — CBC
HCT: 38.9
Hemoglobin: 13
WBC: 5.9

## 2011-03-11 LAB — PROTIME-INR
INR: 3.8 — ABNORMAL HIGH
Prothrombin Time: 38.8 — ABNORMAL HIGH

## 2011-03-11 LAB — CARDIAC PANEL(CRET KIN+CKTOT+MB+TROPI)
Total CK: 51
Total CK: 52
Troponin I: 0.02

## 2011-03-11 LAB — BASIC METABOLIC PANEL
Calcium: 8.7
GFR calc Af Amer: >60
GFR calc non Af Amer: >60
Sodium: 137

## 2011-03-11 LAB — LIPID PANEL
HDL: 37 — ABNORMAL LOW
VLDL: 27

## 2011-03-11 NOTE — Assessment & Plan Note (Signed)
The blood pressure is at target. No change in medications is indicated. We will continue with therapeutic lifestyle changes (TLC).  

## 2011-03-11 NOTE — Assessment & Plan Note (Signed)
The patient understands the need to lose weight with diet and exercise. We have discussed specific strategies for this.  

## 2011-03-11 NOTE — Patient Instructions (Signed)
The current medical regimen is effective;  continue present plan and medications.  Follow up in 6 months with Dr Hochrein.  You will receive a letter in the mail 2 months before you are due.  Please call us when you receive this letter to schedule your follow up appointment.  

## 2011-03-11 NOTE — Assessment & Plan Note (Signed)
We will continue with conservative therapy.

## 2011-03-11 NOTE — Assessment & Plan Note (Signed)
I will review the monitor results.  She will otherwise continue the medications as listed.

## 2011-03-11 NOTE — Progress Notes (Signed)
HPI The patient presents for followup of her atrial fibrillation and lower extremity edema. At that visit I ordered an echo.  This demonstrated a well preserved ejection fraction and no obvious right ventricular dysfunction or elevated pulmonary pressures. The etiology of her lower extremity edema does not appear to be cardiac but may instead be venous incompetence.  It certainly is related to weight and decreased mobility.  We have tried conservative measures and she's been keeping her feet elevated more. This seems to have helped. She will are a Holter monitor to evaluate atrial fibrillation but the results are not available. She denies any chest discomfort, neck or arm discomfort. Her breathing is not quite as labored. She has had no presyncope or syncope. She is not describing PND or orthopnea.  Allergies  Allergen Reactions  . Sulfonamide Derivatives     REACTION: Felt funny    Current Outpatient Prescriptions  Medication Sig Dispense Refill  . acetaminophen (TYLENOL) 500 MG tablet Take 500 mg by mouth 2 (two) times daily as needed.        . ALPRAZolam (XANAX) 0.5 MG tablet Take 0.5 mg by mouth as needed.        . Cyanocobalamin (VITAMIN B-12 IJ) Inject as directed every 30 (thirty) days.        Marland Kitchen diltiazem (CARDIZEM CD) 180 MG 24 hr capsule TAKE ONE CAPSULE BY MOUTH ONE TIME DAILY  30 capsule  5  . gabapentin (NEURONTIN) 300 MG capsule Take 100 mg by mouth daily.       . lansoprazole (PREVACID) 15 MG capsule Take 15 mg by mouth daily.        . Mesalamine (ASACOL HD) 800 MG TBEC Take 1 tablet by mouth 2 (two) times daily.        . metFORMIN (GLUCOPHAGE) 1000 MG tablet Take 1,000 mg by mouth 2 (two) times daily with a meal.        . Multiple Vitamin (MULTIVITAMIN) tablet Take 1 tablet by mouth daily.        Marland Kitchen OVER THE COUNTER MEDICATION 1 tablet daily. VITAMIN D 500 IU       . potassium chloride (KLOR-CON) 10 MEQ CR tablet Take 10 mEq by mouth 2 (two) times a week.        .  triamterene-hydrochlorothiazide (MAXZIDE) 75-50 MG per tablet Take 1 tablet by mouth daily.        . vitamin C (ASCORBIC ACID) 500 MG tablet Take 500 mg by mouth daily.        Marland Kitchen warfarin (COUMADIN) 1 MG tablet Take 1 mg by mouth as directed.          Past Medical History  Diagnosis Date  . Hypertension     x 5 years  . Paroxysmal atrial fibrillation   . Anxiety   . Crohn's disease   . 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 No clear documentation of this  . Diabetes mellitus     Past Surgical History  Procedure Date  . Basal cell carcinoma excision   . Abdominal surgery     multiple  . Vesicovaginal fistula closure w/ tah   . Cancers resected     ROS:  As stated in the HPI and negative for all other systems.  PHYSICAL EXAM BP 140/78  Pulse 108  Resp 18  Ht 4\' 11"  (1.499 m)  Wt 225 lb (102.059 kg)  BMI 45.44 kg/m2 GENERAL:  Well appearing HEENT:  Pupils equal  round and reactive, fundi not visualized, oral mucosa unremarkable, dentures NECK:  JVD to jaw at 45 degrees, waveform within normal limits, carotid upstroke brisk and symmetric, no bruits, no thyromegaly LYMPHATICS:  No cervical, inguinal adenopathy LUNGS:  Clear to auscultation bilaterally BACK:  No CVA tenderness CHEST:  Unremarkable HEART:  PMI not displaced or sustained,S1 and S2 within normal limits, no S3, no S4, no clicks, no rubs, no murmurs, irregular ABD:  Flat, positive bowel sounds normal in frequency in pitch, no bruits, no rebound, no guarding, no midline pulsatile mass, no hepatomegaly, no splenomegaly, obese EXT:  2 plus pulses throughout, moderate nonpitting with some pitting edema left greater than right, no cyanosis no clubbing SKIN:  No rashes, multiple skin tags NEURO:  Cranial nerves II through XII grossly intact, motor grossly intact throughout PSYCH:  Cognitively intact, oriented to person place and time  ASSESSMENT AND PLAN

## 2011-03-11 NOTE — Assessment & Plan Note (Signed)
We again discussed the need to stop smoking but I don't think that she is ready for this.

## 2011-03-12 ENCOUNTER — Encounter (INDEPENDENT_AMBULATORY_CARE_PROVIDER_SITE_OTHER): Payer: Self-pay | Admitting: *Deleted

## 2011-03-12 LAB — I-STAT 8, (EC8 V) (CONVERTED LAB)
Acid-Base Excess: 3 — ABNORMAL HIGH
Glucose, Bld: 187 — ABNORMAL HIGH
HCT: 46
Hemoglobin: 15.6 — ABNORMAL HIGH
Operator id: 198171
Potassium: 3.8
Sodium: 138
TCO2: 31

## 2011-03-12 LAB — CARDIAC PANEL(CRET KIN+CKTOT+MB+TROPI)
CK, MB: 1.3
Relative Index: INVALID
Total CK: 68

## 2011-03-12 LAB — POCT CARDIAC MARKERS: Troponin i, poc: <0.05

## 2011-03-12 LAB — PROTIME-INR
INR: 5.6
Prothrombin Time: 53.5 — ABNORMAL HIGH

## 2011-03-12 LAB — CK TOTAL AND CKMB (NOT AT ARMC)
CK, MB: 1.3
Relative Index: INVALID
Total CK: 62

## 2011-03-12 LAB — MAGNESIUM: Magnesium: 1.9

## 2011-03-12 LAB — APTT: aPTT: 57 — ABNORMAL HIGH

## 2011-04-07 ENCOUNTER — Ambulatory Visit (INDEPENDENT_AMBULATORY_CARE_PROVIDER_SITE_OTHER): Payer: Medicare Other | Admitting: Internal Medicine

## 2011-04-14 ENCOUNTER — Ambulatory Visit (INDEPENDENT_AMBULATORY_CARE_PROVIDER_SITE_OTHER): Payer: Medicare Other | Admitting: Internal Medicine

## 2011-04-27 ENCOUNTER — Telehealth: Payer: Self-pay | Admitting: Internal Medicine

## 2011-04-27 NOTE — Telephone Encounter (Signed)
I had to LM for karen to call back

## 2011-04-28 NOTE — Telephone Encounter (Signed)
I LMTCBx1 at Dr. Kathi Der office to see if they called about this patient. I do not see where this is a pt of ours. Carron Curie, CMA

## 2011-04-29 NOTE — Telephone Encounter (Signed)
lmomtcb x1 

## 2011-04-30 ENCOUNTER — Encounter: Payer: Self-pay | Admitting: Cardiology

## 2011-04-30 NOTE — Telephone Encounter (Signed)
LM for karen to call back

## 2011-05-01 NOTE — Telephone Encounter (Signed)
I spoke with Clydie Braun at [facility] and she stated that the pt had called there and they were unsure what the pt was needing because she was hard to understand. She was wanting to make sure we knew the pt is scheduled for a new consult with Dr. Sherene Sires on Tues., 12/4 in [place].

## 2011-05-05 ENCOUNTER — Encounter: Payer: Self-pay | Admitting: Internal Medicine

## 2011-05-05 ENCOUNTER — Ambulatory Visit (INDEPENDENT_AMBULATORY_CARE_PROVIDER_SITE_OTHER): Payer: Medicare Other | Admitting: Internal Medicine

## 2011-05-05 DIAGNOSIS — J449 Chronic obstructive pulmonary disease, unspecified: Secondary | ICD-10-CM | POA: Insufficient documentation

## 2011-05-05 DIAGNOSIS — F17200 Nicotine dependence, unspecified, uncomplicated: Secondary | ICD-10-CM

## 2011-05-05 DIAGNOSIS — R05 Cough: Secondary | ICD-10-CM

## 2011-05-05 DIAGNOSIS — R609 Edema, unspecified: Secondary | ICD-10-CM

## 2011-05-05 NOTE — Patient Instructions (Signed)
Go to Dr Kathi Der office today for a cxr.  Stopping smoking is most important aspect of your care  Symbicort 160  Take 2 puffs first thing in am and then another 2 puffs about 12 hours later.   Work on inhaler technique:  relax and gently blow all the way out then take a nice smooth deep breath back in, triggering the inhaler at same time you start breathing in.  Hold for up to 5 seconds if you can.  Rinse and gargle with water when done   If your mouth or throat starts to bother you,   I suggest you time the inhaler to your dental care and after using the inhaler(s) brush teeth and tongue with a baking soda containing toothpaste and when you rinse this out, gargle with it first to see if this helps your mouth and throat.     Prevacid 15 mg take two Take 30-60 min before first meal of the day.  GERD (REFLUX)  is an extremely common cause of respiratory symptoms, many times with no significant heartburn at all.    It can be treated with medication, but also with lifestyle changes including avoidance of late meals, excessive alcohol, smoking cessation, and avoid fatty foods, chocolate, peppermint, colas, red wine, and acidic juices such as orange juice.  NO MINT OR MENTHOL PRODUCTS SO NO COUGH DROPS  USE SUGARLESS CANDY INSTEAD (jolley ranchers or Stover's)  NO OIL BASED VITAMINS - use powdered substitutes.     If you are satisfied with your treatment plan let your doctor know and he/she can either refill your medications or you can return here when your prescription runs out.     If in any way you are not 100% satisfied,  please return in one month.  If 100% better, tell your friends!

## 2011-05-05 NOTE — Progress Notes (Signed)
  Subjective:    Patient ID: [name], female    DOB: Apr 04, 1936, 75 y.o.   MRN: [id number]  HPI  64 yowf smoker with persistent cough referred by Christell Constant 05/05/2011 to the pulmonary clinic in [facility]  05/05/2011 1st Pulmonary eval cc indolent onset persisent indolent onset x 3-4 months cough more day than night does not wake her up, minimally productive mostly mucoid,  rx symbicort x one  Week >no better but hfa very poor and "doesn't want to take it p what she heard about it causing lung infections so didn't take it day of ov"  Since cough onset also noted doe x climbing up back deck and li also mailbox to house which is new but assoc with wt gain and overt reflux symptoms taking ppi p lunch at 15 mg of prevacid daily c poor control. No overt sinus complaints. Chronic severe leg swelling.  Sleeping ok without nocturnal  or early am exacerbation  of respiratory  c/o's or need for noct saba. Also denies any obvious fluctuation of symptoms with weather or environmental changes or other aggravating or alleviating factors except as outlined above    Review of Systems  Constitutional: Negative for fever and unexpected weight change.  HENT: Negative for ear pain, nosebleeds, congestion, sore throat, rhinorrhea, sneezing, trouble swallowing, dental problem, postnasal drip and sinus pressure.   Eyes: Negative for redness and itching.  Respiratory: Positive for cough and shortness of breath. Negative for chest tightness and wheezing.   Cardiovascular: Positive for palpitations and leg swelling.  Gastrointestinal: Negative for nausea and vomiting.  Genitourinary: Negative for dysuria.  Musculoskeletal: Negative for joint swelling.  Skin: Negative for rash.  Neurological: Negative for headaches.  Hematological: Does not bruise/bleed easily.  Psychiatric/Behavioral: Negative for dysphoric mood. The patient is not nervous/anxious.        Objective:   Physical Exam  amb hoarse obese wf nad with  dry upper airway sounding cough  Wt 230 05/05/2011   HEENT: nl dentition, turbinates, and orophanx. Nl external ear canals without cough reflex   NECK :  without JVD/Nodes/TM/ nl carotid upstrokes bilaterally   LUNGS: no acc muscle use, clear to A and P bilaterally without cough on insp or exp maneuvers   CV:  RRR  no s3 or murmur or increase in P2,  Massive bilateral pitting edema  ABD:  soft and nontender with nl excursion in the supine position. No bruits or organomegaly, bowel sounds nl  MS:  warm without deformities, calf tenderness, cyanosis or clubbing  SKIN: warm and dry without lesions    NEURO:  alert, approp, no deficits   CXR  05/05/2011 : CM with small L pleural effusion      Assessment & Plan:

## 2011-05-06 NOTE — Assessment & Plan Note (Signed)
She has severe peripheral edema and a small L effusion > defer rx with diuretics to Dr Auburn Bilberry.

## 2011-05-06 NOTE — Assessment & Plan Note (Signed)
DDX of  difficult airways managment all start with A and  include Adherence, Ace Inhibitors, Acid Reflux, Active Sinus Disease, Alpha 1 Antitripsin deficiency, Anxiety masquerading as Airways dz,  ABPA,  allergy(esp in young), Aspiration (esp in elderly), Adverse effects of DPI,  Active smokers, plus two Bs  = Bronchiectasis and Beta blocker use..and one C= CHF    Surprisingly has very little evidence of copd pattern cough or sob despite her smoking hx.  She may well have an athmatic component so The proper method of use, as well as anticipated side effects, of this metered-dose inhaler are discussed and demonstrated to the patient. She improved only to 50% p extensive coaching  Adherence is always the initial "prime suspect" and is a multilayered concern that requires a "trust but verify" approach in every patient - starting with knowing how to use medications, especially inhalers, correctly, keeping up with refills and understanding the fundamental difference between maintenance and prns vs those medications only taken for a very short course and then stopped and not refilled.   Active smoking, discussed elsewhere.

## 2011-05-06 NOTE — Assessment & Plan Note (Signed)
The most common causes of chronic cough in immunocompetent adults include the following: upper airway cough syndrome (UACS), previously referred to as postnasal drip syndrome (PNDS), which is caused by variety of rhinosinus conditions; (2) asthma; (3) GERD; (4) chronic bronchitis from cigarette smoking or other inhaled environmental irritants; (5) nonasthmatic eosinophilic bronchitis; and (6) bronchiectasis.   These conditions, singly or in combination, have accounted for up to 94% of the causes of chronic cough in prospective studies.   Other conditions have constituted no >6% of the causes in prospective studies These have included bronchogenic carcinoma, chronic interstitial pneumonia, sarcoidosis, left ventricular failure, ACEI-induced cough, and aspiration from a condition associated with pharyngeal dysfunction.   This is most likely  Classic Upper airway cough syndrome, so named because it's frequently impossible to sort out how much is  CR/sinusitis with freq throat clearing (which can be related to primary GERD)   vs  causing  secondary (" extra esophageal")  GERD from wide swings in gastric pressure that occur with throat clearing, often  promoting self use of mint and menthol lozenges that reduce the lower esophageal sphincter tone and exacerbate the problem further in a cyclical fashion.   These are the same pts who not infrequently have failed to tolerate ace inhibitors,  dry powder inhalers or biphosphonates or report having reflux symptoms that don't respond to standard doses of PPI , and are easily confused as having aecopd or asthma flares, so needs to first treat gerd effectively then regroup if still symptomatic.

## 2011-05-06 NOTE — Assessment & Plan Note (Signed)
I emphasized that although we never turn away smokers from the pulmonary clinic, we do ask that they understand that the recommendations that we make  won't work nearly as well in the presence of continued cigarette exposure.  In fact, we may very well  reach a point where we can't promise to help the patient if she can't quit smoking. (We can and will promise to try to help, we just can't promise what we recommend will really work)

## 2011-05-14 ENCOUNTER — Other Ambulatory Visit: Payer: Self-pay | Admitting: Orthopedic Surgery

## 2011-05-18 ENCOUNTER — Encounter (HOSPITAL_BASED_OUTPATIENT_CLINIC_OR_DEPARTMENT_OTHER)
Admission: RE | Admit: 2011-05-18 | Discharge: 2011-05-18 | Disposition: A | Discharge status: Home / Self Care | Payer: Medicare Other | Source: Ambulatory Visit | Attending: Orthopedic Surgery | Admitting: Orthopedic Surgery

## 2011-05-18 ENCOUNTER — Encounter (HOSPITAL_BASED_OUTPATIENT_CLINIC_OR_DEPARTMENT_OTHER): Payer: Self-pay | Admitting: *Deleted

## 2011-05-18 LAB — BASIC METABOLIC PANEL
CO2: 32 mEq/L (ref 19–32)
Calcium: 9.3 mg/dL (ref 8.4–10.5)
Chloride: 95 mEq/L — ABNORMAL LOW (ref 96–112)
Glucose, Bld: 124 mg/dL — ABNORMAL HIGH (ref 70–99)
Potassium: 3.2 mEq/L — ABNORMAL LOW (ref 3.5–5.1)
Sodium: 134 mEq/L — ABNORMAL LOW (ref 135–145)

## 2011-05-18 LAB — PROTIME-INR
INR: 1.61 — ABNORMAL HIGH (ref 0.00–1.49)
Prothrombin Time: 19.4 seconds — ABNORMAL HIGH (ref 11.6–15.2)

## 2011-05-18 LAB — APTT: aPTT: 39 seconds — ABNORMAL HIGH (ref 24–37)

## 2011-05-18 NOTE — Progress Notes (Signed)
Coming in for labs Coumadin for AF-EF last echo-08-74% Denies osa,chf Recently seen by dr wert Dr Antoine Poche

## 2011-05-19 ENCOUNTER — Ambulatory Visit (HOSPITAL_BASED_OUTPATIENT_CLINIC_OR_DEPARTMENT_OTHER): Payer: Medicare Other | Admitting: Anesthesiology

## 2011-05-19 ENCOUNTER — Other Ambulatory Visit: Payer: Self-pay | Admitting: Orthopedic Surgery

## 2011-05-19 ENCOUNTER — Encounter (HOSPITAL_BASED_OUTPATIENT_CLINIC_OR_DEPARTMENT_OTHER): Payer: Self-pay | Admitting: Anesthesiology

## 2011-05-19 ENCOUNTER — Encounter (HOSPITAL_BASED_OUTPATIENT_CLINIC_OR_DEPARTMENT_OTHER): Payer: Self-pay

## 2011-05-19 ENCOUNTER — Encounter (HOSPITAL_BASED_OUTPATIENT_CLINIC_OR_DEPARTMENT_OTHER)
Admission: RE | Disposition: A | Discharge status: Home / Self Care | Payer: Self-pay | Source: Ambulatory Visit | Attending: Orthopedic Surgery

## 2011-05-19 ENCOUNTER — Ambulatory Visit (INDEPENDENT_AMBULATORY_CARE_PROVIDER_SITE_OTHER): Payer: Medicare Other | Admitting: Internal Medicine

## 2011-05-19 ENCOUNTER — Ambulatory Visit (HOSPITAL_BASED_OUTPATIENT_CLINIC_OR_DEPARTMENT_OTHER)
Admission: RE | Admit: 2011-05-19 | Discharge: 2011-05-19 | Disposition: A | Discharge status: Home / Self Care | Payer: Medicare Other | Source: Ambulatory Visit | Attending: Orthopedic Surgery | Admitting: Orthopedic Surgery

## 2011-05-19 DIAGNOSIS — Z5309 Procedure and treatment not carried out because of other contraindication: Secondary | ICD-10-CM | POA: Insufficient documentation

## 2011-05-19 DIAGNOSIS — R229 Localized swelling, mass and lump, unspecified: Secondary | ICD-10-CM | POA: Insufficient documentation

## 2011-05-19 DIAGNOSIS — Z01812 Encounter for preprocedural laboratory examination: Secondary | ICD-10-CM | POA: Insufficient documentation

## 2011-05-19 DIAGNOSIS — R799 Abnormal finding of blood chemistry, unspecified: Secondary | ICD-10-CM | POA: Insufficient documentation

## 2011-05-19 HISTORY — DX: Gastro-esophageal reflux disease without esophagitis: K21.9

## 2011-05-19 HISTORY — DX: Chronic obstructive pulmonary disease, unspecified: J44.9

## 2011-05-19 HISTORY — DX: Encounter for other specified aftercare: Z51.89

## 2011-05-19 HISTORY — DX: Unspecified osteoarthritis, unspecified site: M19.90

## 2011-05-19 HISTORY — DX: Myoneural disorder, unspecified: G70.9

## 2011-05-19 HISTORY — DX: Shortness of breath: R06.02

## 2011-05-19 LAB — POCT HEMOGLOBIN-HEMACUE: Hemoglobin: 10.6 g/dL — ABNORMAL LOW (ref 12.0–15.0)

## 2011-05-19 LAB — PROTIME-INR: Prothrombin Time: 20.7 seconds — ABNORMAL HIGH (ref 11.6–15.2)

## 2011-05-19 SURGERY — EXCISION MASS
Anesthesia: Choice

## 2011-05-19 MED ORDER — CEFAZOLIN SODIUM 1-5 GM-% IV SOLN: 1.0000 g | INTRAVENOUS | Status: DC

## 2011-05-19 MED ORDER — CHLORHEXIDINE GLUCONATE 4 % EX LIQD: 60.0000 mL | Freq: Once | CUTANEOUS | Status: DC

## 2011-05-19 MED ORDER — LACTATED RINGERS IV SOLN
INTRAVENOUS | Status: DC
  Administered 2011-05-19: 10:00:00 via INTRAVENOUS

## 2011-05-19 SURGICAL SUPPLY — 49 items
APL SKNCLS STERI-STRIP NONHPOA (GAUZE/BANDAGES/DRESSINGS)
BANDAGE COBAN STERILE 2 (GAUZE/BANDAGES/DRESSINGS) IMPLANT
BANDAGE CONFORM 2  STR LF (GAUZE/BANDAGES/DRESSINGS) IMPLANT
BANDAGE ELASTIC 3 VELCRO ST LF (GAUZE/BANDAGES/DRESSINGS) IMPLANT
BANDAGE GAUZE ELAST BULKY 4 IN (GAUZE/BANDAGES/DRESSINGS) IMPLANT
BANDAGE GAUZE STRT 1 STR LF (GAUZE/BANDAGES/DRESSINGS) IMPLANT
BENZOIN TINCTURE PRP APPL 2/3 (GAUZE/BANDAGES/DRESSINGS) IMPLANT
BLADE MINI RND TIP GREEN BEAV (BLADE) IMPLANT
BLADE SURG 15 STRL LF DISP TIS (BLADE) ×2 IMPLANT
BLADE SURG 15 STRL SS (BLADE)
BNDG CMPR 9X4 STRL LF SNTH (GAUZE/BANDAGES/DRESSINGS)
BNDG CMPR MD 5X2 ELC HKLP STRL (GAUZE/BANDAGES/DRESSINGS)
BNDG COHESIVE 1X5 TAN STRL LF (GAUZE/BANDAGES/DRESSINGS) IMPLANT
BNDG ELASTIC 2 VLCR STRL LF (GAUZE/BANDAGES/DRESSINGS) IMPLANT
BNDG ESMARK 4X9 LF (GAUZE/BANDAGES/DRESSINGS) IMPLANT
BNDG PLASTER X FAST 3X3 WHT LF (CAST SUPPLIES) IMPLANT
BNDG PLSTR 9X3 FST ST WHT (CAST SUPPLIES)
CHLORAPREP W/TINT 26ML (MISCELLANEOUS) ×1 IMPLANT
CLOTH BEACON ORANGE TIMEOUT ST (SAFETY) ×1 IMPLANT
CORDS BIPOLAR (ELECTRODE) ×1 IMPLANT
COVER MAYO STAND STRL (DRAPES) ×1 IMPLANT
COVER TABLE BACK 60X90 (DRAPES) ×1 IMPLANT
CUFF TOURNIQUET SINGLE 18IN (TOURNIQUET CUFF) ×1 IMPLANT
DRAPE EXTREMITY T 121X128X90 (DRAPE) ×1 IMPLANT
DRAPE SURG 17X23 STRL (DRAPES) ×1 IMPLANT
GAUZE XEROFORM 1X8 LF (GAUZE/BANDAGES/DRESSINGS) ×1 IMPLANT
GLOVE BIO SURGEON STRL SZ7.5 (GLOVE) ×1 IMPLANT
GOWN PREVENTION PLUS XLARGE (GOWN DISPOSABLE) ×1 IMPLANT
GOWN STRL REIN XL XLG (GOWN DISPOSABLE) ×1 IMPLANT
NDL HYPO 25X1 1.5 SAFETY (NEEDLE) ×1 IMPLANT
NEEDLE HYPO 25X1 1.5 SAFETY (NEEDLE) IMPLANT
NS IRRIG 1000ML POUR BTL (IV SOLUTION) ×1 IMPLANT
PACK BASIN DAY SURGERY FS (CUSTOM PROCEDURE TRAY) ×1 IMPLANT
PAD CAST 3X4 CTTN HI CHSV (CAST SUPPLIES) IMPLANT
PAD CAST 4YDX4 CTTN HI CHSV (CAST SUPPLIES) IMPLANT
PADDING CAST ABS 4INX4YD NS (CAST SUPPLIES)
PADDING CAST ABS COTTON 4X4 ST (CAST SUPPLIES) ×1 IMPLANT
PADDING CAST COTTON 3X4 STRL (CAST SUPPLIES)
PADDING CAST COTTON 4X4 STRL (CAST SUPPLIES)
SPONGE GAUZE 4X4 12PLY (GAUZE/BANDAGES/DRESSINGS) ×1 IMPLANT
STOCKINETTE 4X48 STRL (DRAPES) ×1 IMPLANT
STRIP CLOSURE SKIN 1/2X4 (GAUZE/BANDAGES/DRESSINGS) IMPLANT
SUT ETHILON 3 0 PS 1 (SUTURE) IMPLANT
SUT ETHILON 4 0 PS 2 18 (SUTURE) ×1 IMPLANT
SYR BULB 3OZ (MISCELLANEOUS) ×1 IMPLANT
SYR CONTROL 10ML LL (SYRINGE) ×1 IMPLANT
TOWEL OR 17X24 6PK STRL BLUE (TOWEL DISPOSABLE) ×2 IMPLANT
UNDERPAD 30X30 INCONTINENT (UNDERPADS AND DIAPERS) ×1 IMPLANT
WATER STERILE IRR 1000ML POUR (IV SOLUTION) ×1 IMPLANT

## 2011-05-19 NOTE — Anesthesia Preprocedure Evaluation (Deleted)
Anesthesia Evaluation  Patient identified by MRN, date of birth, ID band Patient awake    Reviewed: Allergy & Precautions, H&P , NPO status , Patient's Chart, lab work & pertinent test results  History of Anesthesia Complications Negative for: history of anesthetic complications  Airway Mallampati: II TM Distance: >3 FB Neck ROM: Full    Dental No notable dental hx. (+) Upper Dentures and Lower Dentures   Pulmonary shortness of breath, COPD (last inhaler needed a week ago, coughs daily) COPD inhaler,  clear to auscultation  Pulmonary exam normal       Cardiovascular hypertension (took maxzide last night), Pt. on medications + dysrhythmias (Echo 10/12 normal LVF, normal valves) Atrial Fibrillation Irregular Normal    Neuro/Psych Anxiety    GI/Hepatic Neg liver ROS, GERD-  Medicated and Controlled,  Endo/Other  Diabetes mellitus- (glu 84 this am), Type 2, Oral Hypoglycemic AgentsMorbid obesity  Renal/GU negative Renal ROS     Musculoskeletal   Abdominal (+) obese,   Peds  Hematology On coumadin, last dose yesterday, INR pending, was 1.61 yesterday   Anesthesia Other Findings   Reproductive/Obstetrics                         Anesthesia Physical Anesthesia Plan  ASA: III  Anesthesia Plan: General   Post-op Pain Management:    Induction: Intravenous  Airway Management Planned: LMA  Additional Equipment:   Intra-op Plan:   Post-operative Plan:   Informed Consent: I have reviewed the patients History and Physical, chart, labs and discussed the procedure including the risks, benefits and alternatives for the proposed anesthesia with the patient or authorized representative who has indicated his/her understanding and acceptance.     Plan Discussed with: CRNA and Surgeon  Anesthesia Plan Comments: (Plan routine monitors, GA- LMA OK ADDENDUM:  INR 1.74,  Dr Merlyn Lot is cancelling case for  today)       Anesthesia Quick Evaluation

## 2011-05-19 NOTE — H&P (Signed)
[name] is an 75 y.o. female.   Chief Complaint: left hand mass HPI: 75 yo female with mass on left hand dorsum.  Has been enlarging over last couple of months.  History of squamous cell excision on bilateral arms.  Past Medical History  Diagnosis Date  . Hypertension     x 5 years  . Paroxysmal atrial fibrillation   . Anxiety   . Crohn's disease   . 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 No clear documentation of this  . Diabetes mellitus   . Neuromuscular disorder     periph neuropathy  . Shortness of breath   . Arthritis   . GERD (gastroesophageal reflux disease)   . Blood transfusion   . COPD (chronic obstructive pulmonary disease)     Past Surgical History  Procedure Date  . Basal cell carcinoma excision   . Abdominal surgery     multiple  . Vesicovaginal fistula closure w/ tah   . Cancers resected   . Appendectomy   . Abdominal hysterectomy   . Tubal ligation   . Cholecystectomy   . Colon surgery     hx bleeding ulcer    Family History  Problem Relation Age of Onset  . Coronary artery disease Father   . Coronary artery disease Mother   . Coronary artery disease Brother    Social History:  reports that she has been smoking Cigarettes.  She has a 20 pack-year smoking history. She does not have any smokeless tobacco history on file. She reports that she does not drink alcohol or use illicit drugs.  Allergies:  Allergies  Allergen Reactions  . Sulfonamide Derivatives     REACTION: Felt funny    Medications Prior to Admission  Medication Dose Route Frequency Provider Last Rate Last Dose  . ceFAZolin (ANCEF) IVPB 1 g/50 mL premix  1 g Intravenous 60 min Pre-Op       . chlorhexidine (HIBICLENS) 4 % liquid 4 application  60 mL Topical Once       . lactated ringers infusion   Intravenous Continuous Germaine Pomfret, MD 20 mL/hr at 05/19/11 1610     Medications Prior to Admission  Medication Sig Dispense Refill  .  acetaminophen (TYLENOL) 500 MG tablet Take 500 mg by mouth 2 (two) times daily as needed.        . ALPRAZolam (XANAX) 0.5 MG tablet Take 0.5 mg by mouth as needed.        . Cyanocobalamin (VITAMIN B-12 IJ) Inject as directed every 30 (thirty) days.        Marland Kitchen diltiazem (CARDIZEM CD) 180 MG 24 hr capsule TAKE ONE CAPSULE BY MOUTH ONE TIME DAILY  30 capsule  5  . gabapentin (NEURONTIN) 300 MG capsule Take 100 mg by mouth daily.       . lansoprazole (PREVACID) 15 MG capsule Take 15 mg by mouth daily.        . Mesalamine (ASACOL HD) 800 MG TBEC Take 1 tablet by mouth 2 (two) times daily.        . metFORMIN (GLUCOPHAGE) 1000 MG tablet Take 1,000 mg by mouth 2 (two) times daily with a meal.        . Multiple Vitamin (MULTIVITAMIN) tablet Take 1 tablet by mouth daily.        . potassium chloride (KLOR-CON) 10 MEQ CR tablet Take 10 mEq by mouth 2 (two) times a week.        Marland Kitchen  triamterene-hydrochlorothiazide (MAXZIDE) 75-50 MG per tablet Take 1 tablet by mouth daily.        . vitamin C (ASCORBIC ACID) 500 MG tablet Take 500 mg by mouth daily.        Marland Kitchen VITAMIN D, CHOLECALCIFEROL, PO Take 500 mg by mouth daily.        Marland Kitchen warfarin (COUMADIN) 1 MG tablet Take 1 mg by mouth as directed.          Results for orders placed during the hospital encounter of 05/19/11 (from the past 48 hour(s))  BASIC METABOLIC PANEL     Status: Abnormal   Collection Time   05/18/11 11:30 AM      Component Value Range Comment   Sodium 134 (*) 135 - 145 (mEq/L)    Potassium 3.2 (*) 3.5 - 5.1 (mEq/L)    Chloride 95 (*) 96 - 112 (mEq/L)    CO2 32  19 - 32 (mEq/L)    Glucose, Bld 124 (*) 70 - 99 (mg/dL)    BUN 7  6 - 23 (mg/dL)    Creatinine, Ser 1.61  0.50 - 1.10 (mg/dL)    Calcium 9.3  8.4 - 10.5 (mg/dL)    GFR calc non Af Amer 83 (*) >90 (mL/min)    GFR calc Af Amer >90  >90 (mL/min)   PROTIME-INR     Status: Abnormal   Collection Time   05/18/11 11:30 AM      Component Value Range Comment   Prothrombin Time 19.4 (*) 11.6  - 15.2 (seconds)    INR 1.61 (*) 0.00 - 1.49    APTT     Status: Abnormal   Collection Time   05/18/11 11:30 AM      Component Value Range Comment   aPTT 39 (*) 24 - 37 (seconds)   GLUCOSE, CAPILLARY     Status: Normal   Collection Time   05/19/11  9:36 AM      Component Value Range Comment   Glucose-Capillary 83  70 - 99 (mg/dL)   POCT HEMOGLOBIN-HEMACUE     Status: Abnormal   Collection Time   05/19/11  9:38 AM      Component Value Range Comment   Hemoglobin 10.6 (*) 12.0 - 15.0 (g/dL)     No results found.    Blood pressure 124/74, pulse 107, temperature 98.2 F (36.8 C), resp. rate 18, SpO2 96.00%.    Assessment/Plan Case cancelled due to high INR.  Will reschedule.  Megean Fabio R 05/19/2011, 9:49 AM

## 2011-05-19 NOTE — Progress Notes (Signed)
Dr Betha Loa into see pt,  Labs abnormal  Case cancelled,  Pt will call next week  To office for recheck.  Pt voiced understanding  Iv removed, dressed and discharged home with daughter

## 2011-05-25 ENCOUNTER — Encounter (HOSPITAL_BASED_OUTPATIENT_CLINIC_OR_DEPARTMENT_OTHER): Payer: Self-pay

## 2011-06-19 ENCOUNTER — Encounter (HOSPITAL_BASED_OUTPATIENT_CLINIC_OR_DEPARTMENT_OTHER): Payer: Self-pay | Admitting: *Deleted

## 2011-06-19 NOTE — Progress Notes (Signed)
Called pcp in [place] will do pt/inr-bmet mon 1/21-and fax results surg was cancelled 12/12 due to high inr She was cleared for surg here then

## 2011-06-23 ENCOUNTER — Encounter (HOSPITAL_BASED_OUTPATIENT_CLINIC_OR_DEPARTMENT_OTHER): Payer: Self-pay | Admitting: Certified Registered Nurse Anesthetist

## 2011-06-23 ENCOUNTER — Encounter (HOSPITAL_BASED_OUTPATIENT_CLINIC_OR_DEPARTMENT_OTHER)
Admission: RE | Disposition: A | Discharge status: Home / Self Care | Payer: Self-pay | Source: Ambulatory Visit | Attending: Orthopedic Surgery

## 2011-06-23 ENCOUNTER — Other Ambulatory Visit: Payer: Self-pay | Admitting: Orthopedic Surgery

## 2011-06-23 ENCOUNTER — Ambulatory Visit (HOSPITAL_BASED_OUTPATIENT_CLINIC_OR_DEPARTMENT_OTHER): Payer: Medicare Other | Admitting: Certified Registered Nurse Anesthetist

## 2011-06-23 ENCOUNTER — Ambulatory Visit (HOSPITAL_BASED_OUTPATIENT_CLINIC_OR_DEPARTMENT_OTHER)
Admission: RE | Admit: 2011-06-23 | Discharge: 2011-06-23 | Disposition: A | Discharge status: Home / Self Care | Payer: Medicare Other | Source: Ambulatory Visit | Attending: Orthopedic Surgery | Admitting: Orthopedic Surgery

## 2011-06-23 ENCOUNTER — Encounter (HOSPITAL_BASED_OUTPATIENT_CLINIC_OR_DEPARTMENT_OTHER): Payer: Self-pay | Admitting: *Deleted

## 2011-06-23 ENCOUNTER — Encounter (HOSPITAL_BASED_OUTPATIENT_CLINIC_OR_DEPARTMENT_OTHER): Payer: Self-pay | Admitting: Orthopedic Surgery

## 2011-06-23 DIAGNOSIS — R0602 Shortness of breath: Secondary | ICD-10-CM | POA: Insufficient documentation

## 2011-06-23 DIAGNOSIS — I10 Essential (primary) hypertension: Secondary | ICD-10-CM | POA: Insufficient documentation

## 2011-06-23 DIAGNOSIS — K219 Gastro-esophageal reflux disease without esophagitis: Secondary | ICD-10-CM | POA: Insufficient documentation

## 2011-06-23 DIAGNOSIS — F411 Generalized anxiety disorder: Secondary | ICD-10-CM | POA: Insufficient documentation

## 2011-06-23 DIAGNOSIS — I499 Cardiac arrhythmia, unspecified: Secondary | ICD-10-CM | POA: Insufficient documentation

## 2011-06-23 DIAGNOSIS — E119 Type 2 diabetes mellitus without complications: Secondary | ICD-10-CM | POA: Insufficient documentation

## 2011-06-23 DIAGNOSIS — C44621 Squamous cell carcinoma of skin of unspecified upper limb, including shoulder: Secondary | ICD-10-CM | POA: Insufficient documentation

## 2011-06-23 HISTORY — PX: MASS EXCISION: SHX2000

## 2011-06-23 HISTORY — PX: OTHER SURGICAL HISTORY: SHX169

## 2011-06-23 SURGERY — EXCISION MASS
Anesthesia: General | Site: Hand | Laterality: Left | Wound class: Clean

## 2011-06-23 MED ORDER — HYDROCODONE-ACETAMINOPHEN 5-325 MG PO TABS: ORAL_TABLET | ORAL | Status: DC

## 2011-06-23 MED ORDER — HYDROMORPHONE HCL PF 1 MG/ML IJ SOLN: 0.2500 mg | INTRAMUSCULAR | Status: DC | PRN

## 2011-06-23 MED ORDER — FENTANYL CITRATE 0.05 MG/ML IJ SOLN
INTRAMUSCULAR | Status: DC | PRN
  Administered 2011-06-23: 50 ug via INTRAVENOUS

## 2011-06-23 MED ORDER — LACTATED RINGERS IV SOLN
INTRAVENOUS | Status: DC
  Administered 2011-06-23 (×2): via INTRAVENOUS

## 2011-06-23 MED ORDER — PROPOFOL 10 MG/ML IV EMUL
INTRAVENOUS | Status: DC | PRN
  Administered 2011-06-23: 120 mg via INTRAVENOUS

## 2011-06-23 MED ORDER — HYDROCODONE-ACETAMINOPHEN 5-325 MG PO TABS
1.0000 | ORAL_TABLET | Freq: Four times a day (QID) | ORAL | Status: DC | PRN
  Administered 2011-06-23: 1 via ORAL

## 2011-06-23 MED ORDER — CEFAZOLIN SODIUM 1-5 GM-% IV SOLN
INTRAVENOUS | Status: DC | PRN
  Administered 2011-06-23: 2 g via INTRAVENOUS

## 2011-06-23 MED ORDER — ONDANSETRON HCL 4 MG/2ML IJ SOLN: 4.0000 mg | Freq: Four times a day (QID) | INTRAMUSCULAR | Status: DC | PRN

## 2011-06-23 MED ORDER — ONDANSETRON HCL 4 MG/2ML IJ SOLN
INTRAMUSCULAR | Status: DC | PRN
  Administered 2011-06-23 (×2): 4 mg via INTRAVENOUS

## 2011-06-23 SURGICAL SUPPLY — 62 items
APL SKNCLS STERI-STRIP NONHPOA (GAUZE/BANDAGES/DRESSINGS)
BANDAGE COBAN STERILE 2 (GAUZE/BANDAGES/DRESSINGS) IMPLANT
BANDAGE CONFORM 2  STR LF (GAUZE/BANDAGES/DRESSINGS) IMPLANT
BANDAGE ELASTIC 3 VELCRO ST LF (GAUZE/BANDAGES/DRESSINGS) ×1 IMPLANT
BANDAGE GAUZE ELAST BULKY 4 IN (GAUZE/BANDAGES/DRESSINGS) ×1 IMPLANT
BANDAGE GAUZE STRT 1 STR LF (GAUZE/BANDAGES/DRESSINGS) IMPLANT
BENZOIN TINCTURE PRP APPL 2/3 (GAUZE/BANDAGES/DRESSINGS) IMPLANT
BLADE MINI RND TIP GREEN BEAV (BLADE) IMPLANT
BLADE SURG 15 STRL LF DISP TIS (BLADE) ×2 IMPLANT
BLADE SURG 15 STRL SS (BLADE) ×4
BNDG CMPR 9X4 STRL LF SNTH (GAUZE/BANDAGES/DRESSINGS)
BNDG CMPR MD 5X2 ELC HKLP STRL (GAUZE/BANDAGES/DRESSINGS)
BNDG COHESIVE 1X5 TAN STRL LF (GAUZE/BANDAGES/DRESSINGS) IMPLANT
BNDG ELASTIC 2 VLCR STRL LF (GAUZE/BANDAGES/DRESSINGS) IMPLANT
BNDG ESMARK 4X9 LF (GAUZE/BANDAGES/DRESSINGS) IMPLANT
BNDG PLASTER X FAST 3X3 WHT LF (CAST SUPPLIES) IMPLANT
BNDG PLSTR 9X3 FST ST WHT (CAST SUPPLIES)
CHLORAPREP W/TINT 26ML (MISCELLANEOUS) ×2 IMPLANT
CLOTH BEACON ORANGE TIMEOUT ST (SAFETY) ×2 IMPLANT
CORDS BIPOLAR (ELECTRODE) ×2 IMPLANT
COVER MAYO STAND STRL (DRAPES) ×2 IMPLANT
COVER TABLE BACK 60X90 (DRAPES) ×2 IMPLANT
CUFF TOURNIQUET SINGLE 18IN (TOURNIQUET CUFF) ×2 IMPLANT
DRAPE EXTREMITY T 121X128X90 (DRAPE) ×2 IMPLANT
DRAPE SURG 17X23 STRL (DRAPES) ×2 IMPLANT
DRSG EMULSION OIL 3X3 NADH (GAUZE/BANDAGES/DRESSINGS) ×1 IMPLANT
GAUZE XEROFORM 1X8 LF (GAUZE/BANDAGES/DRESSINGS) ×1 IMPLANT
GLOVE BIO SURGEON STRL SZ 6.5 (GLOVE) ×1 IMPLANT
GLOVE BIO SURGEON STRL SZ7.5 (GLOVE) ×2 IMPLANT
GLOVE BIOGEL PI IND STRL 6.5 (GLOVE) IMPLANT
GLOVE BIOGEL PI IND STRL 8 (GLOVE) IMPLANT
GLOVE BIOGEL PI INDICATOR 6.5 (GLOVE) ×1
GLOVE BIOGEL PI INDICATOR 8 (GLOVE) ×2
GLOVE SURG ORTHO 8.0 STRL STRW (GLOVE) ×1 IMPLANT
GOWN PREVENTION PLUS XLARGE (GOWN DISPOSABLE) ×2 IMPLANT
GOWN STRL REIN XL XLG (GOWN DISPOSABLE) ×3 IMPLANT
MATRIX SURGICAL PSM 7X10CM (Tissue) ×1 IMPLANT
MICROMATRIX 500MG (Tissue) ×2 IMPLANT
NDL HYPO 25X1 1.5 SAFETY (NEEDLE) ×1 IMPLANT
NEEDLE HYPO 25X1 1.5 SAFETY (NEEDLE) ×2 IMPLANT
NS IRRIG 1000ML POUR BTL (IV SOLUTION) ×2 IMPLANT
PACK BASIN DAY SURGERY FS (CUSTOM PROCEDURE TRAY) ×2 IMPLANT
PAD CAST 3X4 CTTN HI CHSV (CAST SUPPLIES) IMPLANT
PAD CAST 4YDX4 CTTN HI CHSV (CAST SUPPLIES) IMPLANT
PADDING CAST ABS 4INX4YD NS (CAST SUPPLIES) ×1
PADDING CAST ABS COTTON 4X4 ST (CAST SUPPLIES) ×1 IMPLANT
PADDING CAST COTTON 3X4 STRL (CAST SUPPLIES)
PADDING CAST COTTON 4X4 STRL (CAST SUPPLIES)
SOLUTION PARTIC MCRMTRX 500MG (Tissue) IMPLANT
SPONGE GAUZE 4X4 12PLY (GAUZE/BANDAGES/DRESSINGS) ×2 IMPLANT
STOCKINETTE 4X48 STRL (DRAPES) ×2 IMPLANT
STRIP CLOSURE SKIN 1/2X4 (GAUZE/BANDAGES/DRESSINGS) IMPLANT
SURGILUBE 2OZ TUBE FLIPTOP (MISCELLANEOUS) ×1 IMPLANT
SUT ETHILON 3 0 PS 1 (SUTURE) IMPLANT
SUT ETHILON 4 0 PS 2 18 (SUTURE) ×2 IMPLANT
SUT VIC AB 4-0 P-3 18XBRD (SUTURE) IMPLANT
SUT VIC AB 4-0 P3 18 (SUTURE) ×2
SYR BULB 3OZ (MISCELLANEOUS) ×2 IMPLANT
SYR CONTROL 10ML LL (SYRINGE) ×2 IMPLANT
TOWEL OR 17X24 6PK STRL BLUE (TOWEL DISPOSABLE) ×4 IMPLANT
UNDERPAD 30X30 INCONTINENT (UNDERPADS AND DIAPERS) ×2 IMPLANT
WATER STERILE IRR 1000ML POUR (IV SOLUTION) ×1 IMPLANT

## 2011-06-23 NOTE — Progress Notes (Signed)
Dentures and glasses returned to pt.   

## 2011-06-23 NOTE — Anesthesia Postprocedure Evaluation (Signed)
Anesthesia Post Note  Patient: [name]  Procedure(s) Performed:  EXCISION MASS - left hand excision mass with acell placement  Anesthesia type: General  Patient location: PACU  Post pain: Pain level controlled and Adequate analgesia  Post assessment: Post-op Vital signs reviewed, Patient's Cardiovascular Status Stable, Respiratory Function Stable, Patent Airway and Pain level controlled  Last Vitals:  Filed Vitals:   06/23/11 1024  BP: 113/43  Pulse:   Temp: 36.4 C  Resp: 14    Post vital signs: Reviewed and stable  Level of consciousness: awake, alert  and oriented  Complications: No apparent anesthesia complications

## 2011-06-23 NOTE — Op Note (Signed)
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  PROCEDURE:  Excision of mass, dorsum left hand, 2.5 x 3.5 cm.  SURGEON:  Betha Loa, MD  ASSISTANT:  Cindee Salt, MD  ANESTHESIA:  General.  INTRAVENOUS FLUIDS:  Per anesthesia flow sheet.  ESTIMATED BLOOD LOSS:  Minimal.  COMPLICATIONS:  None.  SPECIMENS:  Mass to Pathology.  TOURNIQUET TIME:  30 minutes.  DISPOSITION:  Stable to PACU.  INDICATIONS:  [name] is a 76 year old female who has had a mass on the dorsum of her left hand for approximately 5 months.  This has gotten bigger in size.  In the last month, however, she feels that it has gotten smaller.  She has not had any fevers, chills, or night sweats. She has had multiple squamous cell carcinomas removed from her bilateral upper extremities in the past.  Discussed with Ms. Reinoso the nature of this type of mass.  This could be a squamous cell carcinoma versus keratoacanthoma.  I would recommend removal of the mass.  We discussed potential for primary closure of the defect versus need for full- thickness skin grafting or ACell placement.  She agreed with the plan of care.  Risks, benefits, and alternatives of surgery were discussed including risks of blood loss, infection, damage to nerves, vessels, tendons, ligaments, and bone, failure of surgery, need for additional surgery, complications with wound healing, continued pain, and recurrence of the mass.  She voiced understanding these risks and elected to proceed.  OPERATIVE COURSE:   After being identified preoperatively by myself, the patient and I agreed upon the procedure and site of procedure.  The surgical site was marked.  The risks, benefits, and alternatives of surgery were reviewed and she wished to proceed.  Surgical consent had been signed.  She was given preoperative antibiotic prophylaxis.  She was transferred to the operating room and placed on the operating table in supine position with the left upper extremity on an arm board. General anesthesia was induced by the anesthesiologist.  The left upper extremity was prepped and draped in normal sterile orthopedic fashion. A surgical pause was performed between surgeons, anesthesia, and operating staff and all were in agreement as to the patient, procedure, and site of procedure.  Tourniquet to the proximal aspect of the extremity was inflated to 250 mmHg after exsanguination of the forearm with an Esmarch bandage.  An ellipsed-type incision was made with 5 mm borders or greater around the mass.  The excision size was 3 x 5.5 cm with 5.5 cm being in a transverse direction.  The full depth of the mass was taken.  The periosteum was able to be left.  The wound was copiously irrigated with  sterile saline.  It was not felt that the wound could be primarily closed.  ACell treatment was chosen.  The defect size was 4 x 6 cm. The wound was covered with the powders per the technique.  The ACell  graft was soaked and then placed over the wound and secured with 4-0  Vicryl suture in a running fashion.  This provided good coverage of the   wound.  The wound was dressed with Adaptic, Surgilube, and a wet 4 x 4 as  well as dry 4 x 4's, Kerlix, and Ace bandage.  Tourniquet was deflated  at 38 minutes.  The operative drapes were broken down and the patient  was awoken from anesthesia safely.  She was transferred back to stretcher  and taken to PACU in stable condition.  I will see her back in the office  in 1 week for  postoperative followup.  I will give her Norco 5/325 one  two p.o. q.6 hours p.r.n. pain, dispensed #30.  She will restart her  Coumadin today.     Betha Loa, MD     KK/MEDQ  D:  06/23/2011  T:  06/23/2011  Job:  [id number]

## 2011-06-23 NOTE — H&P (Signed)
[name] is an 76 y.o. female.   Chief Complaint: left hand mass HPI: 76 yo rhd female with mass on dorsum of left hand x 5 months.  Has had multiple squamous cells removed from bilateral upper extremities and face.  Feels it has gotten smaller in past month.  Past Medical History  Diagnosis Date  . Hypertension     x 5 years  . Paroxysmal atrial fibrillation   . Anxiety   . Crohn's disease   . 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 No clear documentation of this  . Diabetes mellitus   . Neuromuscular disorder     periph neuropathy  . Shortness of breath   . Arthritis   . GERD (gastroesophageal reflux disease)   . Blood transfusion   . COPD (chronic obstructive pulmonary disease)     Past Surgical History  Procedure Date  . Basal cell carcinoma excision   . Abdominal surgery     multiple  . Vesicovaginal fistula closure w/ tah   . Cancers resected   . Appendectomy   . Abdominal hysterectomy   . Tubal ligation   . Cholecystectomy   . Colon surgery     hx bleeding ulcer    Family History  Problem Relation Age of Onset  . Coronary artery disease Father   . Coronary artery disease Mother   . Coronary artery disease Brother    Social History:  reports that she has been smoking Cigarettes.  She has a 20 pack-year smoking history. She does not have any smokeless tobacco history on file. She reports that she does not drink alcohol or use illicit drugs.  Allergies:  Allergies  Allergen Reactions  . Sulfonamide Derivatives     REACTION: Felt funny    Medications Prior to Admission  Medication Dose Route Frequency Provider Last Rate Last Dose  . lactated ringers infusion   Intravenous Continuous Zenon Mayo, MD 20 mL/hr at 06/23/11 [phone]    . DISCONTD: ceFAZolin (ANCEF) IVPB 1 g/50 mL premix  1 g Intravenous 60 min Pre-Op       . DISCONTD: chlorhexidine (HIBICLENS) 4 % liquid 4 application  60 mL Topical Once       . DISCONTD:  lactated ringers infusion   Intravenous Continuous Germaine Pomfret, MD 20 mL/hr at 05/19/11 9604     Medications Prior to Admission  Medication Sig Dispense Refill  . acetaminophen (TYLENOL) 500 MG tablet Take 500 mg by mouth 2 (two) times daily as needed.        . ALPRAZolam (XANAX) 0.5 MG tablet Take 0.5 mg by mouth as needed.        . Cyanocobalamin (VITAMIN B-12 IJ) Inject as directed every 30 (thirty) days.        Marland Kitchen diltiazem (CARDIZEM CD) 180 MG 24 hr capsule TAKE ONE CAPSULE BY MOUTH ONE TIME DAILY  30 capsule  5  . gabapentin (NEURONTIN) 300 MG capsule Take 100 mg by mouth daily.       . lansoprazole (PREVACID) 15 MG capsule Take 15 mg by mouth daily.        . Mesalamine (ASACOL HD) 800 MG TBEC Take 1 tablet by mouth 2 (two) times daily.        . metFORMIN (GLUCOPHAGE) 1000 MG tablet Take 1,000 mg by mouth 2 (two) times daily with a meal.        . Multiple Vitamin (MULTIVITAMIN) tablet Take 1 tablet by mouth  daily.        . potassium chloride (KLOR-CON) 10 MEQ CR tablet Take 10 mEq by mouth 2 (two) times a week.        . triamterene-hydrochlorothiazide (MAXZIDE) 75-50 MG per tablet Take 1 tablet by mouth daily.        . vitamin C (ASCORBIC ACID) 500 MG tablet Take 500 mg by mouth daily.        Marland Kitchen VITAMIN D, CHOLECALCIFEROL, PO Take 500 mg by mouth daily.        Marland Kitchen warfarin (COUMADIN) 1 MG tablet Take 1 mg by mouth as directed.          No results found for this or any previous visit (from the past 48 hour(s)).  No results found.   A comprehensive review of systems was negative except for: Eyes: positive for contacts/glasses Gastrointestinal: positive for stomach ulcer Integument/breast: positive for skin lesion(s) Hematologic/lymphatic: positive for easy bruising  Blood pressure 109/71, pulse 108, temperature 97.8 F (36.6 C), temperature source Oral, resp. rate 24, height 4\' 11"  (1.499 m), weight 102.059 kg (225 lb), SpO2 94.00%.  General appearance: alert, cooperative and  appears stated age Head: Normocephalic, without obvious abnormality, atraumatic Neck: supple, symmetrical, trachea midline Resp: clear to auscultation bilaterally Cardio: regular rate and rhythm GI: soft, non-tender; bowel sounds normal; no masses,  no organomegaly Extremities: light touch sensation and capillary refill intact all digits. +epl/fpl/io.  left hand with mass on dorsum ~ 2cm in diameter.  keratinous.  no erythema or streaks. no ttp. 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  Recommend removal in OR with primary closure vs skin graft or A Cell placement.  Risks, benefits, alternatives discussed and patient agrees with plan of care.  Zeva Leber R 06/23/2011, 8:50 AM

## 2011-06-23 NOTE — Brief Op Note (Signed)
06/23/2011  10:27 AM  PATIENT:  [name]  76 y.o. female  PRE-OPERATIVE DIAGNOSIS:  left hand squamous cell  POST-OPERATIVE DIAGNOSIS:  left hand squamous cell  PROCEDURE:  Procedure(s): EXCISION MASS  SURGEON:  Surgeon(s): Tami Ribas, MD Nicki Reaper, MD  PHYSICIAN ASSISTANT:   ASSISTANTS: none   ANESTHESIA:   general  EBL:  Total I/O In: 1000 [I.V.:1000] Out: -   BLOOD ADMINISTERED:none  DRAINS: none   LOCAL MEDICATIONS USED:  NONE  SPECIMEN:  Excision  DISPOSITION OF SPECIMEN:  PATHOLOGY  COUNTS:  YES  TOURNIQUET:   Total Tourniquet Time Documented: Upper Arm (Left) - 38 minutes  DICTATION: .Other Dictation: Dictation Number [id number]  PLAN OF CARE: Discharge to home after PACU  PATIENT DISPOSITION:  PACU - hemodynamically stable.

## 2011-06-23 NOTE — Op Note (Signed)
Dictation [phone]

## 2011-06-23 NOTE — Transfer of Care (Signed)
Immediate Anesthesia Transfer of Care Note  Patient: [name]  Procedure(s) Performed:  EXCISION MASS - left hand excision mass with acell placement  Patient Location: PACU  Anesthesia Type: General  Level of Consciousness: awake and sedated  Airway & Oxygen Therapy: Patient Spontanous Breathing and Patient connected to face mask oxygen  Post-op Assessment: Report given to PACU RN and Post -op Vital signs reviewed and stable  Post vital signs: Reviewed and stable  Complications: No apparent anesthesia complications

## 2011-06-23 NOTE — Anesthesia Preprocedure Evaluation (Signed)
Anesthesia Evaluation  Patient identified by MRN, date of birth, ID band Patient awake    Reviewed: Allergy & Precautions, H&P , NPO status , Patient's Chart, lab work & pertinent test results  Airway Mallampati: II  Neck ROM: full    Dental   Pulmonary shortness of breath, COPD         Cardiovascular hypertension, + dysrhythmias     Neuro/Psych Anxiety  Neuromuscular disease    GI/Hepatic GERD-  ,  Endo/Other  Diabetes mellitus-  Renal/GU      Musculoskeletal   Abdominal   Peds  Hematology   Anesthesia Other Findings   Reproductive/Obstetrics                           Anesthesia Physical Anesthesia Plan  ASA: III  Anesthesia Plan: General   Post-op Pain Management:    Induction: Intravenous  Airway Management Planned: LMA  Additional Equipment:   Intra-op Plan:   Post-operative Plan:   Informed Consent: I have reviewed the patients History and Physical, chart, labs and discussed the procedure including the risks, benefits and alternatives for the proposed anesthesia with the patient or authorized representative who has indicated his/her understanding and acceptance.     Plan Discussed with: CRNA and Surgeon  Anesthesia Plan Comments:         Anesthesia Quick Evaluation

## 2011-06-23 NOTE — Anesthesia Procedure Notes (Addendum)
Performed by: Jaxsyn Catalfamo D   Procedure Name: LMA Insertion Date/Time: 06/23/2011 9:26 AM Performed by: Luticia Tadros D Pre-anesthesia Checklist: Patient identified, Emergency Drugs available, Suction available and Patient being monitored Patient Re-evaluated:Patient Re-evaluated prior to inductionOxygen Delivery Method: Circle System Utilized Preoxygenation: Pre-oxygenation with 100% oxygen Intubation Type: IV induction Ventilation: Mask ventilation without difficulty LMA: LMA inserted LMA Size: 4.0 Number of attempts: 1 Placement Confirmation: positive ETCO2 Tube secured with: Tape Dental Injury: Teeth and Oropharynx as per pre-operative assessment

## 2011-06-24 ENCOUNTER — Encounter (HOSPITAL_BASED_OUTPATIENT_CLINIC_OR_DEPARTMENT_OTHER): Payer: Self-pay | Admitting: Orthopedic Surgery

## 2011-06-25 LAB — GLUCOSE, CAPILLARY: Glucose-Capillary: 95 mg/dL (ref 70–99)

## 2011-07-06 ENCOUNTER — Encounter (INDEPENDENT_AMBULATORY_CARE_PROVIDER_SITE_OTHER): Payer: Self-pay | Admitting: Internal Medicine

## 2011-07-06 ENCOUNTER — Ambulatory Visit (INDEPENDENT_AMBULATORY_CARE_PROVIDER_SITE_OTHER): Payer: Medicare Other | Admitting: Internal Medicine

## 2011-07-06 DIAGNOSIS — K5090 Crohn's disease, unspecified, without complications: Secondary | ICD-10-CM

## 2011-07-06 DIAGNOSIS — K219 Gastro-esophageal reflux disease without esophagitis: Secondary | ICD-10-CM

## 2011-07-06 DIAGNOSIS — K7460 Unspecified cirrhosis of liver: Secondary | ICD-10-CM | POA: Insufficient documentation

## 2011-07-06 MED ORDER — LANSOPRAZOLE 15 MG PO CPDR: 15.0000 mg | DELAYED_RELEASE_CAPSULE | Freq: Every day | ORAL | Status: DC

## 2011-07-06 NOTE — Progress Notes (Signed)
Presenting complaint; Follow for chronic GERD, cirrhosis and Crohn's disease. Subjective: Patient is 76 year old Caucasian female patient of Dr. Roe Coombs [facility] was there for scheduled visit. She missed her six-month visit because of other health issues. She states she had symptoms of cough for about 3 months. She was seen by pulmonologist. She is advised to take her PPI before breakfast and she has noted significant improvement. She denies heartburn or regurgitation or dysphagia. She has history of esophageal stricture and has undergone dilation in the past. Only time she experiences heartburn is with spicy foods which she does not eat very often. She denies melena rectal bleeding or abdominal pain. She has 3-4 bowel movements per day. She says frequency has increased because she's been under a lot of stress. She has a good appetite and she has gained 26 pounds in the last one year. She had skin lesion removed from back of her left hand 2 weeks ago which was squamous cell carcinoma and she is planning to have another one removed from left side of her nose. Current Medications: Current Outpatient Prescriptions  Medication Sig Dispense Refill  . ALPRAZolam (XANAX) 0.5 MG tablet Take 0.5 mg by mouth as needed.        . Cyanocobalamin (VITAMIN B-12 IJ) Inject as directed every 30 (thirty) days.        Marland Kitchen diltiazem (CARDIZEM CD) 180 MG 24 hr capsule TAKE ONE CAPSULE BY MOUTH ONE TIME DAILY  30 capsule  5  . gabapentin (NEURONTIN) 300 MG capsule Take 100 mg by mouth daily.       . lansoprazole (PREVACID) 15 MG capsule Take 15 mg by mouth daily.        . Mesalamine (ASACOL HD) 800 MG TBEC Take 1 tablet by mouth 2 (two) times daily.        . metFORMIN (GLUCOPHAGE) 1000 MG tablet Take 1,000 mg by mouth 2 (two) times daily with a meal.        . Multiple Vitamin (MULTIVITAMIN) tablet Take 1 tablet by mouth daily.        . potassium chloride (KLOR-CON) 10 MEQ CR tablet Take 10 mEq by mouth 2 (two) times a week.         . triamterene-hydrochlorothiazide (MAXZIDE) 75-50 MG per tablet Take 1 tablet by mouth daily.        . vitamin C (ASCORBIC ACID) 500 MG tablet Take 500 mg by mouth daily.        Marland Kitchen VITAMIN D, CHOLECALCIFEROL, PO Take 500 mg by mouth daily.        Marland Kitchen warfarin (COUMADIN) 1 MG tablet Take 1 mg by mouth as directed.          Objective: Blood pressure 118/70, pulse 74, temperature 97.6 F (36.4 C), temperature source Oral, resp. rate 14, height 4\' 11"  (1.499 m), weight 229 lb 4.8 oz (104.01 kg). Patient is alert and does not have asterixis Conjunctiva is pink. Sclera is nonicteric Oral pharyngeal mucosa is normal. No neck masses or thyromegaly noted. Cardiac exam with regular rhythm normal S1 and S2. No murmur or gallop noted. Lungs are clear to auscultation. Abdomen abdomen is is obese with normal bowel movements. It is soft with mild tenderness to the left umbilicus but no organomegaly or masses noted.  She has nonpitting edema to her lower extremity. She has multiple keratotic skin lesions over her face and extremities. Lab data; From 01/06/2011. WBC 4.6, hemoglobin 12.1, hematocrit 37, platelet count 164K. Bilirubin 1.0, AP 96, AST  23, ALT 12, albumin 3.2, creatinine 0.79.  Assessment: #1. Ileocolonic and rectal Crohn's disease. She appears to be in remission. Intermittent diarrhea possibly do to associated IBS. #2. Chronic GERD. Her coughing spells may well be due to GERD. She is not experiencing dysphagia. #3. Cirrhosis secondary to NAFLD. Hepatic function is well-preserved. However weight gain is worrisome as it wouldn't worsen her fatty liver and as a result, her liver function.   Plan: Increase lansoprazole to 30 mg by mouth every morning. Patient advised to increase physical activity so she can lose some weight. Hepatobiliary ultrasound for HCC screening She'll have alpha-fetoprotein along with rest of the blood work when she sees Dr. Christell Constant in 2 weeks. Office visit in 6  months.

## 2011-07-06 NOTE — Patient Instructions (Signed)
Please have Dr. Christell Constant also do AFP with your next blood work. Note lansoprazole dose changed to 30 mg daily, 30 to 60 minutes before breakfast.

## 2011-07-08 ENCOUNTER — Ambulatory Visit (HOSPITAL_COMMUNITY)
Admission: RE | Admit: 2011-07-08 | Discharge: 2011-07-08 | Disposition: A | Discharge status: Home / Self Care | Payer: Medicare Other | Source: Ambulatory Visit | Attending: Orthopedic Surgery | Admitting: Orthopedic Surgery

## 2011-07-08 ENCOUNTER — Other Ambulatory Visit (HOSPITAL_COMMUNITY): Payer: Self-pay | Admitting: Orthopedic Surgery

## 2011-07-08 ENCOUNTER — Other Ambulatory Visit: Payer: Self-pay | Admitting: Family Medicine

## 2011-07-08 DIAGNOSIS — W19XXXA Unspecified fall, initial encounter: Secondary | ICD-10-CM

## 2011-07-08 DIAGNOSIS — R599 Enlarged lymph nodes, unspecified: Secondary | ICD-10-CM | POA: Insufficient documentation

## 2011-07-08 DIAGNOSIS — R51 Headache: Secondary | ICD-10-CM | POA: Insufficient documentation

## 2011-07-09 ENCOUNTER — Other Ambulatory Visit: Payer: Self-pay | Admitting: Family Medicine

## 2011-07-09 ENCOUNTER — Ambulatory Visit (HOSPITAL_COMMUNITY)
Admission: RE | Admit: 2011-07-09 | Discharge: 2011-07-09 | Disposition: A | Discharge status: Home / Self Care | Payer: Medicare Other | Source: Ambulatory Visit | Attending: Family Medicine | Admitting: Family Medicine

## 2011-07-09 DIAGNOSIS — R9389 Abnormal findings on diagnostic imaging of other specified body structures: Secondary | ICD-10-CM | POA: Insufficient documentation

## 2011-07-09 DIAGNOSIS — R942 Abnormal results of pulmonary function studies: Secondary | ICD-10-CM

## 2011-07-09 DIAGNOSIS — R599 Enlarged lymph nodes, unspecified: Secondary | ICD-10-CM | POA: Insufficient documentation

## 2011-07-09 MED ORDER — IOHEXOL 300 MG/ML  SOLN
75.0000 mL | Freq: Once | INTRAMUSCULAR | Status: AC | PRN
  Administered 2011-07-09: 75 mL via INTRAVENOUS

## 2011-07-17 ENCOUNTER — Ambulatory Visit (HOSPITAL_COMMUNITY)
Admission: RE | Admit: 2011-07-17 | Discharge: 2011-07-17 | Disposition: A | Discharge status: Home / Self Care | Payer: Medicare Other | Source: Ambulatory Visit | Attending: Internal Medicine | Admitting: Internal Medicine

## 2011-07-17 DIAGNOSIS — K7460 Unspecified cirrhosis of liver: Secondary | ICD-10-CM | POA: Insufficient documentation

## 2011-08-04 ENCOUNTER — Ambulatory Visit: Payer: Medicare Other | Admitting: Internal Medicine

## 2011-08-29 ENCOUNTER — Other Ambulatory Visit: Payer: Self-pay | Admitting: Cardiology

## 2011-08-31 NOTE — Telephone Encounter (Signed)
..   Requested Prescriptions   Pending Prescriptions Disp Refills  . diltiazem (CARDIZEM CD) 180 MG 24 hr capsule [Pharmacy Med Name: DILTIAZEM CD 180 MG CAP PAR] 30 capsule 5    Sig: TAKE ONE CAPSULE BY MOUTH ONE TIME DAILY

## 2011-09-09 ENCOUNTER — Ambulatory Visit (INDEPENDENT_AMBULATORY_CARE_PROVIDER_SITE_OTHER): Payer: Medicare Other | Admitting: Cardiology

## 2011-09-09 ENCOUNTER — Encounter: Payer: Self-pay | Admitting: Cardiology

## 2011-09-09 VITALS — BP 160/100 | HR 111 | Ht 60.0 in | Wt 241.0 lb

## 2011-09-09 DIAGNOSIS — I10 Essential (primary) hypertension: Secondary | ICD-10-CM

## 2011-09-09 DIAGNOSIS — E663 Overweight: Secondary | ICD-10-CM

## 2011-09-09 DIAGNOSIS — F17200 Nicotine dependence, unspecified, uncomplicated: Secondary | ICD-10-CM

## 2011-09-09 DIAGNOSIS — I4891 Unspecified atrial fibrillation: Secondary | ICD-10-CM

## 2011-09-09 DIAGNOSIS — R609 Edema, unspecified: Secondary | ICD-10-CM

## 2011-09-09 NOTE — Assessment & Plan Note (Signed)
Her blood pressure is elevated today but he says that this is not usually the case. She says it's in the 130s over 60s at home and he takes routinely. I reviewed her previous blood pressures have never been this elevated. At this point she will continue her blood pressure diary and I will make no changes to her regimen.

## 2011-09-09 NOTE — Assessment & Plan Note (Signed)
We again discussed this and she is not able to quit smoking. If

## 2011-09-09 NOTE — Patient Instructions (Signed)
The current medical regimen is effective;  continue present plan and medications.  Follow up in 1 year with Dr Hochrein.  You will receive a letter in the mail 2 months before you are due.  Please call us when you receive this letter to schedule your follow up appointment.  

## 2011-09-09 NOTE — Assessment & Plan Note (Addendum)
The patient  tolerates this rhythm and rate control and anticoagulation. We will continue with the meds as listed. She has no interest in switching to one of the new anticoagulant. Of note she had good rate control on a Holter monitor last fall.

## 2011-09-09 NOTE — Assessment & Plan Note (Signed)
It is sad to see how limited she is by her obesity.  We discussed this. She needs calorie restriction.

## 2011-09-09 NOTE — Progress Notes (Signed)
HP The patient presents for followup of her atrial fibrillation and lower extremity edema. Echo did not suggest a cardiac etiology to this.  Since I last saw her she has done well.  The patient denies any new symptoms such as chest discomfort, neck or arm discomfort. There has been no new shortness of breath, PND or orthopnea. There have been no reported palpitations, presyncope or syncope.  She is limited by leg pain and her morbid obesity.  Allergies  Allergen Reactions  . Sulfonamide Derivatives     REACTION: Felt funny    Current Outpatient Prescriptions  Medication Sig Dispense Refill  . ALPRAZolam (XANAX) 0.5 MG tablet Take 0.5 mg by mouth as needed.        . Cyanocobalamin (VITAMIN B-12 IJ) Inject as directed every 30 (thirty) days.        Marland Kitchen diltiazem (CARDIZEM CD) 180 MG 24 hr capsule TAKE ONE CAPSULE BY MOUTH ONE TIME DAILY  30 capsule  5  . gabapentin (NEURONTIN) 300 MG capsule Take 100 mg by mouth daily.       . lansoprazole (PREVACID) 15 MG capsule Take 1 capsule (15 mg total) by mouth daily.  30 capsule  11  . Mesalamine (ASACOL HD) 800 MG TBEC Take 1 tablet by mouth 2 (two) times daily.        . metFORMIN (GLUCOPHAGE) 1000 MG tablet Take 1,000 mg by mouth 2 (two) times daily with a meal.        . Multiple Vitamin (MULTIVITAMIN) tablet Take 1 tablet by mouth daily.        . potassium chloride (KLOR-CON) 10 MEQ CR tablet Take 10 mEq by mouth 2 (two) times a week.        . triamterene-hydrochlorothiazide (MAXZIDE) 75-50 MG per tablet Take 1 tablet by mouth daily.        . vitamin C (ASCORBIC ACID) 500 MG tablet Take 500 mg by mouth daily.        Marland Kitchen VITAMIN D, CHOLECALCIFEROL, PO Take 500 mg by mouth daily.        Marland Kitchen warfarin (COUMADIN) 1 MG tablet Take 1 mg by mouth as directed.          Past Medical History  Diagnosis Date  . Hypertension     x 5 years  . Paroxysmal atrial fibrillation   . Anxiety   . Crohn's disease   . 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 No clear documentation of this  . Diabetes mellitus   . Neuromuscular disorder     periph neuropathy  . Shortness of breath   . Arthritis   . GERD (gastroesophageal reflux disease)   . Blood transfusion   . COPD (chronic obstructive pulmonary disease)     Past Surgical History  Procedure Date  . Basal cell carcinoma excision   . Abdominal surgery     multiple  . Vesicovaginal fistula closure w/ tah   . Cancers resected   . Appendectomy   . Abdominal hysterectomy   . Tubal ligation   . Cholecystectomy   . Colon surgery     hx bleeding ulcer  . Mass excision 06/23/2011    Procedure: EXCISION MASS;  Surgeon: Tami Ribas, MD;  Location: [facility];  Service: Orthopedics;  Laterality: Left;  left hand excision mass with acell placement  . Left hand 06/23/11    Removed Squamous cell , and also did graft    ROS:  As stated in the HPI and negative for all other systems.  PHYSICAL EXAM BP 160/100  Pulse 111  Ht 5' (1.524 m)  Wt 241 lb (109.317 kg)  BMI 47.07 kg/m2 GENERAL:  Well appearing HEENT:  Pupils equal round and reactive, fundi not visualized, oral mucosa unremarkable, dentures NECK:  JVD to jaw at 45 degrees, waveform within normal limits, carotid upstroke brisk and symmetric, no bruits, no thyromegaly LYMPHATICS:  No cervical, inguinal adenopathy LUNGS:  Clear to auscultation bilaterally BACK:  No CVA tenderness CHEST:  Unremarkable HEART:  PMI not displaced or sustained,S1 and S2 within normal limits, no S3, no S4, no clicks, no rubs, no murmurs, irregular ABD:  Flat, positive bowel sounds normal in frequency in pitch, no bruits, no rebound, no guarding, no midline pulsatile mass, no hepatomegaly, no splenomegaly, obese EXT:  2 plus pulses throughout, moderate nonpitting with some pitting edema left greater than right, no cyanosis no clubbing SKIN:  No rashes, multiple skin tags  EKG:  Atrial fibrillation, rate 111, low-voltage in  the chest leads, no acute ST-T wave changes.  09/09/2011   ASSESSMENT AND PLAN

## 2011-09-09 NOTE — Assessment & Plan Note (Addendum)
This is related to weight, inactivity and probably some component of lymphedema. She needs lifestyle changes. She's to keep her feet elevated and limit her salt. She needs weight loss. I doubt that she would benefit from a brown lymphedema physical therapy with lifestyle changes.

## 2011-10-15 ENCOUNTER — Other Ambulatory Visit: Payer: Self-pay | Admitting: Orthopedic Surgery

## 2011-10-29 NOTE — Progress Notes (Signed)
Pt here  1/13 for hand surg-was done under gen Tolerated surg well She had labs done at [facility] Called dr Buel Ream office-they will do pt ptt,bmet 5/31 and fax results to [place] Pt aware

## 2011-11-02 ENCOUNTER — Encounter (HOSPITAL_BASED_OUTPATIENT_CLINIC_OR_DEPARTMENT_OTHER)
Admission: RE | Disposition: A | Discharge status: Home / Self Care | Payer: Self-pay | Source: Ambulatory Visit | Attending: Orthopedic Surgery

## 2011-11-02 ENCOUNTER — Encounter (HOSPITAL_BASED_OUTPATIENT_CLINIC_OR_DEPARTMENT_OTHER): Payer: Self-pay | Admitting: Certified Registered Nurse Anesthetist

## 2011-11-02 ENCOUNTER — Ambulatory Visit (HOSPITAL_BASED_OUTPATIENT_CLINIC_OR_DEPARTMENT_OTHER): Payer: Medicare Other | Admitting: Certified Registered Nurse Anesthetist

## 2011-11-02 ENCOUNTER — Encounter (HOSPITAL_BASED_OUTPATIENT_CLINIC_OR_DEPARTMENT_OTHER): Payer: Self-pay | Admitting: *Deleted

## 2011-11-02 ENCOUNTER — Ambulatory Visit (HOSPITAL_BASED_OUTPATIENT_CLINIC_OR_DEPARTMENT_OTHER)
Admission: RE | Admit: 2011-11-02 | Discharge: 2011-11-02 | Disposition: A | Discharge status: Home / Self Care | Payer: Medicare Other | Source: Ambulatory Visit | Attending: Orthopedic Surgery | Admitting: Orthopedic Surgery

## 2011-11-02 DIAGNOSIS — E119 Type 2 diabetes mellitus without complications: Secondary | ICD-10-CM | POA: Insufficient documentation

## 2011-11-02 DIAGNOSIS — G609 Hereditary and idiopathic neuropathy, unspecified: Secondary | ICD-10-CM | POA: Insufficient documentation

## 2011-11-02 DIAGNOSIS — J4489 Other specified chronic obstructive pulmonary disease: Secondary | ICD-10-CM | POA: Insufficient documentation

## 2011-11-02 DIAGNOSIS — K219 Gastro-esophageal reflux disease without esophagitis: Secondary | ICD-10-CM | POA: Insufficient documentation

## 2011-11-02 DIAGNOSIS — K08109 Complete loss of teeth, unspecified cause, unspecified class: Secondary | ICD-10-CM | POA: Insufficient documentation

## 2011-11-02 DIAGNOSIS — I4891 Unspecified atrial fibrillation: Secondary | ICD-10-CM | POA: Insufficient documentation

## 2011-11-02 DIAGNOSIS — C44621 Squamous cell carcinoma of skin of unspecified upper limb, including shoulder: Secondary | ICD-10-CM | POA: Insufficient documentation

## 2011-11-02 DIAGNOSIS — F411 Generalized anxiety disorder: Secondary | ICD-10-CM | POA: Insufficient documentation

## 2011-11-02 DIAGNOSIS — J449 Chronic obstructive pulmonary disease, unspecified: Secondary | ICD-10-CM | POA: Insufficient documentation

## 2011-11-02 DIAGNOSIS — I10 Essential (primary) hypertension: Secondary | ICD-10-CM | POA: Insufficient documentation

## 2011-11-02 DIAGNOSIS — Z85828 Personal history of other malignant neoplasm of skin: Secondary | ICD-10-CM | POA: Insufficient documentation

## 2011-11-02 HISTORY — PX: MASS EXCISION: SHX2000

## 2011-11-02 LAB — GLUCOSE, CAPILLARY: Glucose-Capillary: 91 mg/dL (ref 70–99)

## 2011-11-02 SURGERY — EXCISION MASS
Anesthesia: General | Site: Hand | Laterality: Left | Wound class: Clean

## 2011-11-02 MED ORDER — CHLORHEXIDINE GLUCONATE 4 % EX LIQD
60.0000 mL | Freq: Once | CUTANEOUS | Status: AC
  Administered 2011-11-02: 4 via TOPICAL

## 2011-11-02 MED ORDER — LACTATED RINGERS IV SOLN
INTRAVENOUS | Status: DC
  Administered 2011-11-02: 08:00:00 via INTRAVENOUS

## 2011-11-02 MED ORDER — ONDANSETRON HCL 4 MG/2ML IJ SOLN
INTRAMUSCULAR | Status: DC | PRN
  Administered 2011-11-02: 4 mg via INTRAVENOUS

## 2011-11-02 MED ORDER — ONDANSETRON HCL 4 MG/2ML IJ SOLN: 4.0000 mg | Freq: Once | INTRAMUSCULAR | Status: DC | PRN

## 2011-11-02 MED ORDER — LIDOCAINE HCL (CARDIAC) 20 MG/ML IV SOLN
INTRAVENOUS | Status: DC | PRN
  Administered 2011-11-02: 60 mg via INTRAVENOUS

## 2011-11-02 MED ORDER — HYDROCODONE-ACETAMINOPHEN 5-325 MG PO TABS: ORAL_TABLET | ORAL | Status: DC

## 2011-11-02 MED ORDER — CEFAZOLIN SODIUM 1-5 GM-% IV SOLN
1.0000 g | INTRAVENOUS | Status: AC
  Administered 2011-11-02: 2 g via INTRAVENOUS

## 2011-11-02 MED ORDER — ACETAMINOPHEN 10 MG/ML IV SOLN: 1000.0000 mg | Freq: Once | INTRAVENOUS | Status: DC | PRN

## 2011-11-02 MED ORDER — PROPOFOL 10 MG/ML IV EMUL
INTRAVENOUS | Status: DC | PRN
  Administered 2011-11-02: 100 mg via INTRAVENOUS

## 2011-11-02 MED ORDER — HYDROMORPHONE HCL PF 1 MG/ML IJ SOLN: 0.2500 mg | INTRAMUSCULAR | Status: DC | PRN

## 2011-11-02 MED ORDER — FENTANYL CITRATE 0.05 MG/ML IJ SOLN
INTRAMUSCULAR | Status: DC | PRN
  Administered 2011-11-02: 50 ug via INTRAVENOUS

## 2011-11-02 MED ORDER — BUPIVACAINE HCL (PF) 0.25 % IJ SOLN
INTRAMUSCULAR | Status: DC | PRN
  Administered 2011-11-02: 10 mL

## 2011-11-02 SURGICAL SUPPLY — 59 items
APL SKNCLS STERI-STRIP NONHPOA (GAUZE/BANDAGES/DRESSINGS)
BANDAGE COBAN STERILE 2 (GAUZE/BANDAGES/DRESSINGS) IMPLANT
BANDAGE CONFORM 2  STR LF (GAUZE/BANDAGES/DRESSINGS) IMPLANT
BANDAGE ELASTIC 3 VELCRO ST LF (GAUZE/BANDAGES/DRESSINGS) ×2 IMPLANT
BANDAGE GAUZE ELAST BULKY 4 IN (GAUZE/BANDAGES/DRESSINGS) ×2 IMPLANT
BANDAGE GAUZE STRT 1 STR LF (GAUZE/BANDAGES/DRESSINGS) IMPLANT
BENZOIN TINCTURE PRP APPL 2/3 (GAUZE/BANDAGES/DRESSINGS) IMPLANT
BLADE MINI RND TIP GREEN BEAV (BLADE) IMPLANT
BLADE SURG 15 STRL LF DISP TIS (BLADE) ×2 IMPLANT
BLADE SURG 15 STRL SS (BLADE) ×4
BNDG CMPR 9X4 STRL LF SNTH (GAUZE/BANDAGES/DRESSINGS) ×1
BNDG CMPR MD 5X2 ELC HKLP STRL (GAUZE/BANDAGES/DRESSINGS)
BNDG COHESIVE 1X5 TAN STRL LF (GAUZE/BANDAGES/DRESSINGS) IMPLANT
BNDG ELASTIC 2 VLCR STRL LF (GAUZE/BANDAGES/DRESSINGS) IMPLANT
BNDG ESMARK 4X9 LF (GAUZE/BANDAGES/DRESSINGS) ×1 IMPLANT
BNDG PLASTER X FAST 3X3 WHT LF (CAST SUPPLIES) ×1 IMPLANT
BNDG PLSTR 9X3 FST ST WHT (CAST SUPPLIES) ×1
CHLORAPREP W/TINT 26ML (MISCELLANEOUS) ×2 IMPLANT
CLOTH BEACON ORANGE TIMEOUT ST (SAFETY) ×2 IMPLANT
CORDS BIPOLAR (ELECTRODE) ×2 IMPLANT
COVER MAYO STAND STRL (DRAPES) ×2 IMPLANT
COVER TABLE BACK 60X90 (DRAPES) ×2 IMPLANT
CUFF TOURNIQUET SINGLE 18IN (TOURNIQUET CUFF) ×2 IMPLANT
DRAPE EXTREMITY T 121X128X90 (DRAPE) ×2 IMPLANT
DRAPE SURG 17X23 STRL (DRAPES) ×2 IMPLANT
GAUZE XEROFORM 1X8 LF (GAUZE/BANDAGES/DRESSINGS) ×2 IMPLANT
GLOVE BIO SURGEON STRL SZ 6.5 (GLOVE) ×1 IMPLANT
GLOVE BIO SURGEON STRL SZ7.5 (GLOVE) ×2 IMPLANT
GLOVE BIOGEL PI IND STRL 7.0 (GLOVE) IMPLANT
GLOVE BIOGEL PI IND STRL 8 (GLOVE) ×1 IMPLANT
GLOVE BIOGEL PI INDICATOR 7.0 (GLOVE) ×1
GLOVE BIOGEL PI INDICATOR 8 (GLOVE) ×1
GOWN PREVENTION PLUS XLARGE (GOWN DISPOSABLE) ×2 IMPLANT
GOWN PREVENTION PLUS XXLARGE (GOWN DISPOSABLE) ×1 IMPLANT
GOWN STRL REIN XL XLG (GOWN DISPOSABLE) ×1 IMPLANT
NDL HYPO 25X1 1.5 SAFETY (NEEDLE) ×1 IMPLANT
NEEDLE HYPO 25X1 1.5 SAFETY (NEEDLE) ×2 IMPLANT
NS IRRIG 1000ML POUR BTL (IV SOLUTION) ×2 IMPLANT
PACK BASIN DAY SURGERY FS (CUSTOM PROCEDURE TRAY) ×2 IMPLANT
PAD CAST 3X4 CTTN HI CHSV (CAST SUPPLIES) IMPLANT
PAD CAST 4YDX4 CTTN HI CHSV (CAST SUPPLIES) IMPLANT
PADDING CAST ABS 4INX4YD NS (CAST SUPPLIES) ×1
PADDING CAST ABS COTTON 4X4 ST (CAST SUPPLIES) ×1 IMPLANT
PADDING CAST COTTON 3X4 STRL (CAST SUPPLIES) ×2
PADDING CAST COTTON 4X4 STRL (CAST SUPPLIES)
SPLINT PLASTER CAST XFAST 3X15 (CAST SUPPLIES) IMPLANT
SPLINT PLASTER XTRA FASTSET 3X (CAST SUPPLIES) ×1
SPONGE GAUZE 4X4 12PLY (GAUZE/BANDAGES/DRESSINGS) ×2 IMPLANT
STOCKINETTE 4X48 STRL (DRAPES) ×2 IMPLANT
STRIP CLOSURE SKIN 1/2X4 (GAUZE/BANDAGES/DRESSINGS) IMPLANT
SUT CHROMIC 4 0 P 3 18 (SUTURE) ×1 IMPLANT
SUT ETHILON 3 0 PS 1 (SUTURE) IMPLANT
SUT ETHILON 4 0 PS 2 18 (SUTURE) ×2 IMPLANT
SUT SILK 2 0 SH (SUTURE) ×1 IMPLANT
SYR BULB 3OZ (MISCELLANEOUS) ×2 IMPLANT
SYR CONTROL 10ML LL (SYRINGE) ×2 IMPLANT
TOWEL OR 17X24 6PK STRL BLUE (TOWEL DISPOSABLE) ×3 IMPLANT
UNDERPAD 30X30 INCONTINENT (UNDERPADS AND DIAPERS) ×2 IMPLANT
WATER STERILE IRR 1000ML POUR (IV SOLUTION) ×1 IMPLANT

## 2011-11-02 NOTE — Op Note (Signed)
[name], [name]             ACCOUNT NO.:  [id number]  MEDICAL RECORD NO.:  [id number]  LOCATION:                                 FACILITY:  PHYSICIAN:  Betha Loa, MD        DATE OF BIRTH:  [birth year]  DATE OF PROCEDURE:  11/02/2011 DATE OF DISCHARGE:                              OPERATIVE REPORT   PREOPERATIVE DIAGNOSIS:  Left hand mass.  POSTOPERATIVE DIAGNOSIS:  Left hand mass.  PROCEDURE:  Excision of mass of the left hand and full-thickness skin grafting 4 x 2.5 cm.  SURGEON:  Betha Loa, M.D.  ASSISTANT:  None.  ANESTHESIA:  General.  IV FLUIDS:  Per anesthesia flow sheet.  ESTIMATED BLOOD LOSS:  Minimal.  COMPLICATIONS:  None.  SPECIMENS:  Left hand mass to Pathology.  TOURNIQUET TIME:  59 minutes.  DISPOSITION:  Stable to PACU.  INDICATIONS:  [name] is a 76 year old female who has had multiple squamous cells removed from bilateral upper extremities.  She had a squamous cell removed from the dorsum of her left hand in January 2013. There was no new mass on the dorsum of the hand on the ulnar side.  I discussed with [name] the nature of the condition.  I think, this is a new mass rather than a recurrence.  Risks, benefits, alternatives to surgery were discussed including the risk of blood loss; infection; damage to nerves, vessels, tendons, ligaments, bone; failure of procedure; need for additional procedure; complications with wound healing; continued pain; and recurrence of mass.  She voiced understanding of these risks and elected to proceed.  OPERATIVE COURSE:  After being identified preoperatively by myself, the patient and I agreed upon procedure and site of procedure.  The surgical site was marked.  The risks, benefits, alternatives of surgery were reviewed, and she wished to proceed.  Surgical consent had been signed. She was given 1 g of IV Ancef as preoperative antibiotic prophylaxis. She was transported the operating room and  placed on the operating room table in supine position with the left upper extremity on arm board. General anesthesia was induced by the anesthesiologist.  The left upper extremity was prepped and draped in normal sterile orthopedic fashion. A surgical pause was performed between surgeons, anesthesia, operating staff, and all were in agreement with the patient, procedure, and site of procedure.  Tourniquet at the proximal aspect of the extremity was inflated to 250 mmHg after light exsanguination of the limb with an Esmarch bandage.  The mass was dressed.  It was excised in an elliptical fashion with 5 mm borders on all sides of the mass.  There was some scar tissue in the subcutaneous tissues on the radial side of the mass on the deep surface.  This appeared to be where the previous mass had been excised.  The mass was excised in its entirety.  It did not  extend deep to the tendons.  It was tagged on its distal extent and sent to Pathology for examination.  The wound was not able to be closed primarily.  A full-thickness skin graft was taken from the ulnar side of the upper arm.  This was 4 x 2.5  cm in dimension.  Clean instruments were used at the upper extremity.  The skin was undermined at the upper extremity and closed with 4-0 nylon in a horizontal mattress fashion.  The skin graft was fenestrated.  It was placed over the defect on the dorsum of the hand.  It was secured using 4-0 chromic suture in a running fashion.  2-0 silk stay sutures were placed at the corners.  The edges of the graft were dressed with sterile Xeroform and fluffed gauze placed over top of the graft and secured using the stay sutures.  The wounds had both been injected with 10 mL of 0.25% plain Marcaine to aid in postoperative analgesia.  The wounds were then dressed with sterile Xeroform, 4x4s, and wrapped with a Kerlix bandage. A volar splint was placed at the wrist and wrapped with Kerlix and Ace bandage.   Tourniquet was deflated at 59 minutes.  The fingertips were pink with brisk capillary refill after deflation of the tourniquet. Operative drapes were broken down and the patient was awoken from anesthesia safely.  She was transferred back to stretcher and taken to PACU in stable condition.  I will see her back in the office in 1 week for postoperative followup.  I will give her Norco 5/325, 1-2 p.o. q.6 hours p.r.n. pain, dispensed #40.     Betha Loa, MD     KK/MEDQ  D:  11/02/2011  T:  11/02/2011  Job:  [id number]

## 2011-11-02 NOTE — Transfer of Care (Signed)
Immediate Anesthesia Transfer of Care Note  Patient: [name]  Procedure(s) Performed: Procedure(s) (LRB): EXCISION MASS (Left)  Patient Location: PACU  Anesthesia Type: General  Level of Consciousness: awake and patient cooperative  Airway & Oxygen Therapy: Patient Spontanous Breathing and Patient connected to face mask oxygen  Post-op Assessment: Report given to PACU RN and Post -op Vital signs reviewed and stable  Post vital signs: Reviewed and stable  Complications: No apparent anesthesia complications

## 2011-11-02 NOTE — Anesthesia Preprocedure Evaluation (Addendum)
Anesthesia Evaluation  Patient identified by MRN, date of birth, ID band Patient awake    Reviewed: Allergy & Precautions, H&P , NPO status , Patient's Chart, lab work & pertinent test results  Airway Mallampati: II      Dental  (+) Edentulous Upper and Edentulous Lower   Pulmonary    + decreased breath sounds      Cardiovascular Rhythm:Irregular Rate:Normal     Neuro/Psych    GI/Hepatic   Endo/Other    Renal/GU      Musculoskeletal   Abdominal   Peds  Hematology   Anesthesia Other Findings   Reproductive/Obstetrics                           Anesthesia Physical Anesthesia Plan  ASA: III  Anesthesia Plan: General   Post-op Pain Management:    Induction: Intravenous  Airway Management Planned: LMA  Additional Equipment:   Intra-op Plan:   Post-operative Plan:   Informed Consent: I have reviewed the patients History and Physical, chart, labs and discussed the procedure including the risks, benefits and alternatives for the proposed anesthesia with the patient or authorized representative who has indicated his/her understanding and acceptance.     Plan Discussed with:   Anesthesia Plan Comments: (Squamous cell ca L. 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

## 2011-11-02 NOTE — Anesthesia Procedure Notes (Signed)
Procedure Name: LMA Insertion Date/Time: 11/02/2011 9:16 AM Performed by: Katrenia Alkins D Pre-anesthesia Checklist: Patient identified, Emergency Drugs available, Suction available and Patient being monitored Patient Re-evaluated:Patient Re-evaluated prior to inductionOxygen Delivery Method: Circle System Utilized Preoxygenation: Pre-oxygenation with 100% oxygen Intubation Type: IV induction Ventilation: Mask ventilation without difficulty LMA: LMA inserted LMA Size: 4.0 Number of attempts: 1 Placement Confirmation: positive ETCO2 Tube secured with: Tape Dental Injury: Teeth and Oropharynx as per pre-operative assessment

## 2011-11-02 NOTE — Op Note (Signed)
Dictation [id number]

## 2011-11-02 NOTE — H&P (Signed)
[name] is an 76 y.o. female.   Chief Complaint: left hand mass HPI: 76 yo female with mass on left hand.  Had squamous cell removed left hand 06/23/11.  Now with new mass x 1 month.  She would like this removed.  Not painful.  Past Medical History  Diagnosis Date  . Hypertension     x 5 years  . Paroxysmal atrial fibrillation   . Anxiety   . Crohn's disease   . 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 No clear documentation of this  . Diabetes mellitus   . Neuromuscular disorder     periph neuropathy  . Shortness of breath   . Arthritis   . GERD (gastroesophageal reflux disease)   . Blood transfusion   . COPD (chronic obstructive pulmonary disease)     Past Surgical History  Procedure Date  . Basal cell carcinoma excision   . Abdominal surgery     multiple  . Vesicovaginal fistula closure w/ tah   . Cancers resected   . Appendectomy   . Abdominal hysterectomy   . Tubal ligation   . Cholecystectomy   . Colon surgery     hx bleeding ulcer  . Mass excision 06/23/2011    Procedure: EXCISION MASS;  Surgeon: Tami Ribas, MD;  Location: [facility];  Service: Orthopedics;  Laterality: Left;  left hand excision mass with acell placement  . Left hand 06/23/11    Removed Squamous cell , and also did graft    Family History  Problem Relation Age of Onset  . Coronary artery disease Father   . Heart attack Father   . Coronary artery disease Mother   . Coronary artery disease Brother   . Heart attack Brother   . Pancreatic cancer Brother   . Heart attack Brother   . Leukemia Daughter   . Healthy Daughter    Social History:  reports that she has been smoking Cigarettes.  She has a 20 pack-year smoking history. She has never used smokeless tobacco. She reports that she does not drink alcohol or use illicit drugs.  Allergies:  Allergies  Allergen Reactions  . Sulfonamide Derivatives     REACTION: Felt funny    Medications  Prior to Admission  Medication Sig Dispense Refill  . ALPRAZolam (XANAX) 0.5 MG tablet Take 0.5 mg by mouth as needed.        . Cyanocobalamin (VITAMIN B-12 IJ) Inject as directed every 30 (thirty) days.        Marland Kitchen diltiazem (CARDIZEM CD) 180 MG 24 hr capsule TAKE ONE CAPSULE BY MOUTH ONE TIME DAILY  30 capsule  5  . lansoprazole (PREVACID) 15 MG capsule Take 1 capsule (15 mg total) by mouth daily.  30 capsule  11  . Mesalamine (ASACOL HD) 800 MG TBEC Take 1 tablet by mouth 2 (two) times daily.        . metFORMIN (GLUCOPHAGE) 1000 MG tablet Take 1,000 mg by mouth 2 (two) times daily with a meal.        . Multiple Vitamin (MULTIVITAMIN) tablet Take 1 tablet by mouth daily.        . potassium chloride (KLOR-CON) 10 MEQ CR tablet Take 10 mEq by mouth 2 (two) times a week.        . triamterene-hydrochlorothiazide (MAXZIDE) 75-50 MG per tablet Take 1 tablet by mouth daily.        . vitamin C (ASCORBIC ACID) 500  MG tablet Take 500 mg by mouth daily.        Marland Kitchen VITAMIN D, CHOLECALCIFEROL, PO Take 500 mg by mouth daily.        Marland Kitchen warfarin (COUMADIN) 1 MG tablet Take 1 mg by mouth as directed.        . gabapentin (NEURONTIN) 300 MG capsule Take 100 mg by mouth daily.         Results for orders placed during the hospital encounter of 11/02/11 (from the past 48 hour(s))  GLUCOSE, CAPILLARY     Status: Normal   Collection Time   11/02/11  8:04 AM      Component Value Range Comment   Glucose-Capillary 76  70 - 99 (mg/dL)   POCT HEMOGLOBIN-HEMACUE     Status: Abnormal   Collection Time   11/02/11  8:06 AM      Component Value Range Comment   Hemoglobin 10.7 (*) 12.0 - 15.0 (g/dL)     No results found.   A comprehensive review of systems was negative except for: Eyes: positive for contacts/glasses Respiratory: positive for cough Gastrointestinal: positive for ulcer Integument/breast: positive for skin lesion(s) Hematologic/lymphatic: positive for easy bruising  Blood pressure 121/70, pulse 106,  temperature 97.9 F (36.6 C), resp. rate 20, height 5' (1.524 m), weight 105.688 kg (233 lb), SpO2 94.00%.  General appearance: alert, cooperative and appears stated age Head: Normocephalic, without obvious abnormality, atraumatic Neck: supple, symmetrical, trachea midline Resp: clear to auscultation bilaterally Cardio: regular rate and rhythm GI: soft, non-tender; bowel sounds normal; no masses,  no organomegaly Extremities: left hand with intact sensation and capillary refill all digits.  +epl/fpl/io.  skin mass dorsum left hand to ulnar side of previous excision. Pulses: 2+ and symmetric Skin: as above Neurologic: Grossly normal Incision/Wound: na  Assessment/Plan Left hand mass.  This appears to be a new lesion rather than a recurrence of the previous lesion.  She has had multiple squamous cell carcinomas removed from her arms.  Discussed with patient nature of condition.  She elected to undergo excision mass with full thickness skin grafting if necessary.  Risks, benefits, and alternatives of surgery were discussed and the patient agrees with the plan of care.   Corneilus Heggie R 11/02/2011, 8:57 AM

## 2011-11-02 NOTE — Anesthesia Postprocedure Evaluation (Signed)
  Anesthesia Post-op Note  Patient: [name]  Procedure(s) Performed: Procedure(s) (LRB): EXCISION MASS (Left)  Patient Location: PACU  Anesthesia Type: General  Level of Consciousness: awake, alert  and oriented  Airway and Oxygen Therapy: Patient Spontanous Breathing  Post-op Pain: mild  Post-op Assessment: Post-op Vital signs reviewed and Patient's Cardiovascular Status Stable  Post-op Vital Signs: stable  Complications: No apparent anesthesia complications

## 2011-11-02 NOTE — Discharge Instructions (Addendum)

## 2011-11-05 ENCOUNTER — Encounter (HOSPITAL_BASED_OUTPATIENT_CLINIC_OR_DEPARTMENT_OTHER): Payer: Self-pay | Admitting: Orthopedic Surgery

## 2012-01-05 ENCOUNTER — Encounter (INDEPENDENT_AMBULATORY_CARE_PROVIDER_SITE_OTHER): Payer: Self-pay | Admitting: Internal Medicine

## 2012-01-05 ENCOUNTER — Ambulatory Visit (INDEPENDENT_AMBULATORY_CARE_PROVIDER_SITE_OTHER): Payer: Medicare Other | Admitting: Internal Medicine

## 2012-01-05 VITALS — BP 130/80 | HR 76 | Temp 97.7°F | Resp 20 | Ht 59.0 in | Wt 241.0 lb

## 2012-01-05 DIAGNOSIS — K7460 Unspecified cirrhosis of liver: Secondary | ICD-10-CM

## 2012-01-05 DIAGNOSIS — K5090 Crohn's disease, unspecified, without complications: Secondary | ICD-10-CM

## 2012-01-05 DIAGNOSIS — K219 Gastro-esophageal reflux disease without esophagitis: Secondary | ICD-10-CM

## 2012-01-05 NOTE — Patient Instructions (Signed)
Can take Imodium OTC 1-2 mg daily as needed. Please have the following with your next blood work and have Dr. Kathi Der office fax [place] a report. CBC, LFTs and AFP

## 2012-01-05 NOTE — Progress Notes (Signed)
Presenting complaint;  Followup for Crohn's disease, GERD and cirrhosis. Subjective:  Patient is 76 year old Caucasian female who is in for scheduled visit accompanied by her daughter. She was last seen 6 months ago. She does not feel well. She has gained 12 pounds since her last visit. Most of her complaints are none GI. She has noted lower extremity edema. She fell twice. She is having difficulty moving around. She remains with good appetite. She states her weight has gone up because she has been snacking long with her granddaughter. He complains of diarrhea with 3-4 bowel movements per day. She does have a couple of normal stools per week. Stool caliber remains thin but has not changed. She denies abdominal pain melena or rectal bleeding. She also does not experience sense of incomplete evacuation. She states her heartburn is well controlled with therapy and she denies dysphagia. She had skin lesion removal from back of her left hand with skin graft without any complications.  Current Medications: Current Outpatient Prescriptions  Medication Sig Dispense Refill  . acetaminophen (TYLENOL) 500 MG tablet Take 500 mg by mouth 2 (two) times daily.      Marland Kitchen ALPRAZolam (XANAX) 0.5 MG tablet Take 0.5 mg by mouth 2 (two) times daily as needed.       . Cyanocobalamin (VITAMIN B-12 IJ) Inject as directed every 30 (thirty) days.        Marland Kitchen diltiazem (CARDIZEM CD) 180 MG 24 hr capsule TAKE ONE CAPSULE BY MOUTH ONE TIME DAILY  30 capsule  5  . ergocalciferol (VITAMIN D2) 50000 UNITS capsule Take 50,000 Units by mouth once a week.      . ferrous sulfate 325 (65 FE) MG tablet Take 325 mg by mouth. Patient takes this medication 3 times per week      . lansoprazole (PREVACID) 15 MG capsule Take 1 capsule (15 mg total) by mouth daily.  30 capsule  11  . Mesalamine (ASACOL HD) 800 MG TBEC Take 1 tablet by mouth 2 (two) times daily.        . metFORMIN (GLUCOPHAGE) 1000 MG tablet Take 1,000 mg by mouth 2 (two) times  daily with a meal.        . Multiple Vitamin (MULTIVITAMIN) tablet Take 1 tablet by mouth daily.        . potassium chloride (KLOR-CON) 10 MEQ CR tablet Take 10 mEq by mouth 2 (two) times a week.        . triamterene-hydrochlorothiazide (MAXZIDE) 75-50 MG per tablet Take 1 tablet by mouth daily.        . vitamin C (ASCORBIC ACID) 500 MG tablet Take 500 mg by mouth daily.        Marland Kitchen warfarin (COUMADIN) 1 MG tablet Take 1 mg by mouth as directed.           Objective: Blood pressure 130/80, pulse 76, temperature 97.7 F (36.5 C), temperature source Oral, resp. rate 20, height 4\' 11"  (1.499 m), weight 241 lb (109.317 kg). Patient is alert and in no acute distress. She had difficulty in one step to examination table and was helped by Ms. Tammy Todd and myself. Conjunctiva is pink. Sclera is nonicteric Oropharyngeal mucosa is normal. No neck masses or thyromegaly noted. Cardiac exam with regular rhythm normal S1 and S2. No murmur or gallop noted. Lungs are clear to auscultation. Abdomen is protuberant. Small umbilicus hernia noted. Abdomen is soft and nontender without organomegaly or masses. She has extensive keratosis to her legs and some edema.  She also has multiple keratosis over her for head face and neck.  Assessment:  #1. Ileocolonic Crohn's disease. She is having intermittent diarrhea but has no other symptoms to suggest relapse. She is currently on oral mesalamine. #2. GERD. She is doing well with therapy. #3. Cirrhosis secondary to NAFLD. Up to this point she hasn't experienced complications of this disease. Her last albumin 4 months ago was 3.0. Weight gain unfortunately would not help her chronic liver disease. She therefore needs to change her eating habits and try to lose weight. #4. Patient is having problems with ambulation. Will benefit from physical therapy.   Plan:  Patient will continue lansoprazole and Asacol HD at current dose. Samples of Asacol HD given to  patient. She will have CBC, LFTs and alpha-fetoprotein with her next blood work at [facility]. Patient will talk with Dr. Christell Constant if she would benefit from physical therapy. Patient advised to quit eating snacks and stick with diabetic diet. Office visit in 6 months

## 2012-01-31 DIAGNOSIS — R601 Generalized edema: Secondary | ICD-10-CM

## 2012-01-31 HISTORY — DX: Generalized edema: R60.1

## 2012-02-09 ENCOUNTER — Emergency Department (HOSPITAL_COMMUNITY): Payer: Medicare Other

## 2012-02-09 ENCOUNTER — Inpatient Hospital Stay (HOSPITAL_COMMUNITY)
Admission: EM | Admit: 2012-02-09 | Discharge: 2012-02-15 | DRG: 193 | Disposition: A | Discharge status: Home / Self Care | Payer: Medicare Other | Attending: Internal Medicine | Admitting: Internal Medicine

## 2012-02-09 ENCOUNTER — Encounter (HOSPITAL_COMMUNITY): Payer: Self-pay

## 2012-02-09 DIAGNOSIS — J9602 Acute respiratory failure with hypercapnia: Secondary | ICD-10-CM

## 2012-02-09 DIAGNOSIS — E876 Hypokalemia: Secondary | ICD-10-CM | POA: Diagnosis present

## 2012-02-09 DIAGNOSIS — K429 Umbilical hernia without obstruction or gangrene: Secondary | ICD-10-CM | POA: Diagnosis present

## 2012-02-09 DIAGNOSIS — Z6841 Body Mass Index (BMI) 40.0 and over, adult: Secondary | ICD-10-CM

## 2012-02-09 DIAGNOSIS — E662 Morbid (severe) obesity with alveolar hypoventilation: Secondary | ICD-10-CM | POA: Diagnosis present

## 2012-02-09 DIAGNOSIS — Z7901 Long term (current) use of anticoagulants: Secondary | ICD-10-CM

## 2012-02-09 DIAGNOSIS — I872 Venous insufficiency (chronic) (peripheral): Secondary | ICD-10-CM | POA: Diagnosis present

## 2012-02-09 DIAGNOSIS — R5381 Other malaise: Secondary | ICD-10-CM | POA: Diagnosis present

## 2012-02-09 DIAGNOSIS — R0600 Dyspnea, unspecified: Secondary | ICD-10-CM

## 2012-02-09 DIAGNOSIS — K7460 Unspecified cirrhosis of liver: Secondary | ICD-10-CM | POA: Diagnosis present

## 2012-02-09 DIAGNOSIS — I509 Heart failure, unspecified: Secondary | ICD-10-CM | POA: Diagnosis present

## 2012-02-09 DIAGNOSIS — J9610 Chronic respiratory failure, unspecified whether with hypoxia or hypercapnia: Secondary | ICD-10-CM | POA: Diagnosis present

## 2012-02-09 DIAGNOSIS — F17200 Nicotine dependence, unspecified, uncomplicated: Secondary | ICD-10-CM | POA: Diagnosis present

## 2012-02-09 DIAGNOSIS — D649 Anemia, unspecified: Secondary | ICD-10-CM

## 2012-02-09 DIAGNOSIS — D518 Other vitamin B12 deficiency anemias: Secondary | ICD-10-CM | POA: Diagnosis present

## 2012-02-09 DIAGNOSIS — I4891 Unspecified atrial fibrillation: Secondary | ICD-10-CM | POA: Diagnosis present

## 2012-02-09 DIAGNOSIS — E663 Overweight: Secondary | ICD-10-CM | POA: Diagnosis present

## 2012-02-09 DIAGNOSIS — I10 Essential (primary) hypertension: Secondary | ICD-10-CM | POA: Diagnosis present

## 2012-02-09 DIAGNOSIS — R0902 Hypoxemia: Secondary | ICD-10-CM | POA: Diagnosis present

## 2012-02-09 DIAGNOSIS — F411 Generalized anxiety disorder: Secondary | ICD-10-CM | POA: Diagnosis present

## 2012-02-09 DIAGNOSIS — I868 Varicose veins of other specified sites: Secondary | ICD-10-CM | POA: Diagnosis present

## 2012-02-09 DIAGNOSIS — R609 Edema, unspecified: Secondary | ICD-10-CM | POA: Diagnosis present

## 2012-02-09 DIAGNOSIS — E8809 Other disorders of plasma-protein metabolism, not elsewhere classified: Secondary | ICD-10-CM | POA: Diagnosis present

## 2012-02-09 DIAGNOSIS — J90 Pleural effusion, not elsewhere classified: Secondary | ICD-10-CM

## 2012-02-09 DIAGNOSIS — K5090 Crohn's disease, unspecified, without complications: Secondary | ICD-10-CM | POA: Diagnosis present

## 2012-02-09 DIAGNOSIS — J9620 Acute and chronic respiratory failure, unspecified whether with hypoxia or hypercapnia: Secondary | ICD-10-CM | POA: Diagnosis present

## 2012-02-09 DIAGNOSIS — J449 Chronic obstructive pulmonary disease, unspecified: Secondary | ICD-10-CM

## 2012-02-09 DIAGNOSIS — H5440 Blindness, one eye, unspecified eye: Secondary | ICD-10-CM

## 2012-02-09 DIAGNOSIS — D509 Iron deficiency anemia, unspecified: Secondary | ICD-10-CM | POA: Diagnosis present

## 2012-02-09 DIAGNOSIS — J189 Pneumonia, unspecified organism: Principal | ICD-10-CM | POA: Diagnosis present

## 2012-02-09 DIAGNOSIS — I878 Other specified disorders of veins: Secondary | ICD-10-CM

## 2012-02-09 DIAGNOSIS — J441 Chronic obstructive pulmonary disease with (acute) exacerbation: Secondary | ICD-10-CM | POA: Diagnosis present

## 2012-02-09 DIAGNOSIS — Z720 Tobacco use: Secondary | ICD-10-CM | POA: Diagnosis present

## 2012-02-09 DIAGNOSIS — I517 Cardiomegaly: Secondary | ICD-10-CM | POA: Diagnosis present

## 2012-02-09 DIAGNOSIS — E119 Type 2 diabetes mellitus without complications: Secondary | ICD-10-CM | POA: Diagnosis present

## 2012-02-09 DIAGNOSIS — R05 Cough: Secondary | ICD-10-CM

## 2012-02-09 HISTORY — DX: Generalized edema: R60.1

## 2012-02-09 HISTORY — DX: Cardiomegaly: I51.7

## 2012-02-09 HISTORY — DX: Chronic respiratory failure, unspecified whether with hypoxia or hypercapnia: J96.10

## 2012-02-09 HISTORY — DX: Pleural effusion, not elsewhere classified: J90

## 2012-02-09 HISTORY — DX: Unspecified cirrhosis of liver: K74.60

## 2012-02-09 HISTORY — DX: Anemia, unspecified: D64.9

## 2012-02-09 LAB — COMPREHENSIVE METABOLIC PANEL
ALT: 14 U/L (ref 0–35)
AST: 27 U/L (ref 0–37)
CO2: 32 mEq/L (ref 19–32)
Chloride: 97 mEq/L (ref 96–112)
GFR calc non Af Amer: 86 mL/min — ABNORMAL LOW (ref >90)
Potassium: 3.1 mEq/L — ABNORMAL LOW (ref 3.5–5.1)
Sodium: 135 mEq/L (ref 135–145)
Total Bilirubin: 0.6 mg/dL (ref 0.3–1.2)

## 2012-02-09 LAB — MRSA PCR SCREENING: MRSA by PCR: NEGATIVE

## 2012-02-09 LAB — CBC WITH DIFFERENTIAL/PLATELET
Basophils Absolute: 0 10*3/uL (ref 0.0–0.1)
HCT: 33.4 % — ABNORMAL LOW (ref 36.0–46.0)
Lymphocytes Relative: 18 % (ref 12–46)
Neutro Abs: 3.1 10*3/uL (ref 1.7–7.7)
Neutrophils Relative %: 63 % (ref 43–77)
Platelets: 212 10*3/uL (ref 150–400)
RDW: 19.2 % — ABNORMAL HIGH (ref 11.5–15.5)
WBC: 5 10*3/uL (ref 4.0–10.5)

## 2012-02-09 LAB — GLUCOSE, CAPILLARY: Glucose-Capillary: 184 mg/dL — ABNORMAL HIGH (ref 70–99)

## 2012-02-09 LAB — PROTIME-INR: INR: 2.71 — ABNORMAL HIGH (ref 0.00–1.49)

## 2012-02-09 MED ORDER — POTASSIUM CHLORIDE CRYS ER 20 MEQ PO TBCR: 20.0000 meq | EXTENDED_RELEASE_TABLET | Freq: Two times a day (BID) | ORAL | Status: DC

## 2012-02-09 MED ORDER — POTASSIUM CHLORIDE CRYS ER 20 MEQ PO TBCR
40.0000 meq | EXTENDED_RELEASE_TABLET | Freq: Once | ORAL | Status: AC
  Administered 2012-02-09: 40 meq via ORAL
  Filled 2012-02-09: qty 2

## 2012-02-09 MED ORDER — WARFARIN SODIUM 1 MG PO TABS
1.0000 mg | ORAL_TABLET | ORAL | Status: DC
  Administered 2012-02-10 – 2012-02-14 (×4): 1 mg via ORAL
  Filled 2012-02-09 (×5): qty 1

## 2012-02-09 MED ORDER — MESALAMINE 400 MG PO TBEC
DELAYED_RELEASE_TABLET | ORAL | Status: AC
  Filled 2012-02-09: qty 2

## 2012-02-09 MED ORDER — VITAMIN D (ERGOCALCIFEROL) 1.25 MG (50000 UNIT) PO CAPS
50000.0000 [IU] | ORAL_CAPSULE | ORAL | Status: DC
  Administered 2012-02-15: 50000 [IU] via ORAL
  Filled 2012-02-09: qty 1

## 2012-02-09 MED ORDER — ADULT MULTIVITAMIN W/MINERALS CH
1.0000 | ORAL_TABLET | Freq: Every day | ORAL | Status: DC
  Administered 2012-02-10 – 2012-02-15 (×6): 1 via ORAL
  Filled 2012-02-09 (×7): qty 1

## 2012-02-09 MED ORDER — WARFARIN - PHARMACIST DOSING INPATIENT: Freq: Every day | Status: DC

## 2012-02-09 MED ORDER — INSULIN ASPART 100 UNIT/ML ~~LOC~~ SOLN
0.0000 [IU] | Freq: Three times a day (TID) | SUBCUTANEOUS | Status: DC
  Administered 2012-02-11: 2 [IU] via SUBCUTANEOUS
  Administered 2012-02-11: 8 [IU] via SUBCUTANEOUS
  Administered 2012-02-11: 3 [IU] via SUBCUTANEOUS
  Administered 2012-02-12: 2 [IU] via SUBCUTANEOUS
  Administered 2012-02-12: 3 [IU] via SUBCUTANEOUS
  Administered 2012-02-13: 2 [IU] via SUBCUTANEOUS
  Administered 2012-02-13 – 2012-02-14 (×2): 3 [IU] via SUBCUTANEOUS
  Administered 2012-02-14 – 2012-02-15 (×2): 2 [IU] via SUBCUTANEOUS

## 2012-02-09 MED ORDER — ACETAMINOPHEN 500 MG PO TABS
500.0000 mg | ORAL_TABLET | Freq: Three times a day (TID) | ORAL | Status: DC | PRN
  Administered 2012-02-12 – 2012-02-14 (×3): 500 mg via ORAL
  Filled 2012-02-09 (×3): qty 1

## 2012-02-09 MED ORDER — MESALAMINE 400 MG PO TBEC
800.0000 mg | DELAYED_RELEASE_TABLET | Freq: Two times a day (BID) | ORAL | Status: DC
  Administered 2012-02-09 – 2012-02-15 (×12): 800 mg via ORAL
  Filled 2012-02-09 (×16): qty 2

## 2012-02-09 MED ORDER — WARFARIN SODIUM 1 MG PO TABS
1.5000 mg | ORAL_TABLET | ORAL | Status: DC
  Administered 2012-02-12 – 2012-02-15 (×2): 1.5 mg via ORAL
  Filled 2012-02-09: qty 2
  Filled 2012-02-09: qty 1

## 2012-02-09 MED ORDER — DILTIAZEM HCL 25 MG/5ML IV SOLN
10.0000 mg | Freq: Four times a day (QID) | INTRAVENOUS | Status: DC | PRN
  Administered 2012-02-11: 10 mg via INTRAVENOUS
  Filled 2012-02-09 (×2): qty 5

## 2012-02-09 MED ORDER — MAGNESIUM SULFATE 40 MG/ML IJ SOLN
2.0000 g | Freq: Once | INTRAMUSCULAR | Status: AC
  Administered 2012-02-09: 2 g via INTRAVENOUS
  Filled 2012-02-09: qty 50

## 2012-02-09 MED ORDER — SODIUM CHLORIDE 0.9 % IV SOLN: 250.0000 mL | INTRAVENOUS | Status: DC | PRN

## 2012-02-09 MED ORDER — FUROSEMIDE 10 MG/ML IJ SOLN
40.0000 mg | Freq: Once | INTRAMUSCULAR | Status: AC
  Administered 2012-02-09: 40 mg via INTRAVENOUS
  Filled 2012-02-09: qty 4

## 2012-02-09 MED ORDER — PANTOPRAZOLE SODIUM 40 MG PO TBEC
40.0000 mg | DELAYED_RELEASE_TABLET | Freq: Every day | ORAL | Status: DC
  Administered 2012-02-09 – 2012-02-15 (×7): 40 mg via ORAL
  Filled 2012-02-09 (×7): qty 1

## 2012-02-09 MED ORDER — FUROSEMIDE 10 MG/ML IJ SOLN
40.0000 mg | Freq: Two times a day (BID) | INTRAMUSCULAR | Status: DC
  Administered 2012-02-09 – 2012-02-10 (×2): 40 mg via INTRAVENOUS
  Filled 2012-02-09 (×2): qty 4

## 2012-02-09 MED ORDER — DILTIAZEM HCL 25 MG/5ML IV SOLN
INTRAVENOUS | Status: AC
  Filled 2012-02-09: qty 5

## 2012-02-09 MED ORDER — ALPRAZOLAM 0.5 MG PO TABS
0.2500 mg | ORAL_TABLET | Freq: Two times a day (BID) | ORAL | Status: DC
  Administered 2012-02-09 – 2012-02-15 (×11): 0.5 mg via ORAL
  Filled 2012-02-09 (×12): qty 1

## 2012-02-09 MED ORDER — SODIUM CHLORIDE 0.9 % IJ SOLN: 3.0000 mL | INTRAMUSCULAR | Status: DC | PRN

## 2012-02-09 MED ORDER — FERROUS SULFATE 325 (65 FE) MG PO TABS
325.0000 mg | ORAL_TABLET | Freq: Every day | ORAL | Status: DC
  Administered 2012-02-10 – 2012-02-15 (×6): 325 mg via ORAL
  Filled 2012-02-09 (×6): qty 1

## 2012-02-09 MED ORDER — SODIUM CHLORIDE 0.9 % IJ SOLN: 3.0000 mL | Freq: Two times a day (BID) | INTRAMUSCULAR | Status: DC

## 2012-02-09 MED ORDER — ALBUTEROL SULFATE (5 MG/ML) 0.5% IN NEBU: 2.5000 mg | INHALATION_SOLUTION | RESPIRATORY_TRACT | Status: DC | PRN

## 2012-02-09 MED ORDER — DILTIAZEM HCL 25 MG/5ML IV SOLN
10.0000 mg | Freq: Once | INTRAVENOUS | Status: AC
  Administered 2012-02-09: 10 mg via INTRAVENOUS

## 2012-02-09 MED ORDER — DILTIAZEM HCL ER COATED BEADS 180 MG PO CP24
180.0000 mg | ORAL_CAPSULE | Freq: Every day | ORAL | Status: DC
  Administered 2012-02-09 – 2012-02-15 (×7): 180 mg via ORAL
  Filled 2012-02-09 (×9): qty 1

## 2012-02-09 MED ORDER — ALBUTEROL SULFATE (5 MG/ML) 0.5% IN NEBU
2.5000 mg | INHALATION_SOLUTION | Freq: Four times a day (QID) | RESPIRATORY_TRACT | Status: DC
  Administered 2012-02-09 – 2012-02-10 (×3): 2.5 mg via RESPIRATORY_TRACT
  Filled 2012-02-09 (×3): qty 0.5

## 2012-02-09 NOTE — ED Notes (Signed)
Pt reports has been swelling and having rapid weight gain for the past few months.  Reports the past couple of days has had sob.

## 2012-02-09 NOTE — Plan of Care (Signed)
Problem: Phase I Progression Outcomes Goal: Voiding-avoid urinary catheter unless indicated Outcome: Not Met (add Reason) Placed foley catheter due to IV lasix

## 2012-02-09 NOTE — ED Notes (Signed)
Patient ambulated to restroom. Patient reports difficulty walking due to fluid build up on lower extremities bilaterally. Patient's oxygen saturation reading 88% on room air and patient short of breath after ambulating. Patient placed on 2 liters of oxygen via nasal canula, oxygen saturation then came up to 98%.

## 2012-02-09 NOTE — Progress Notes (Signed)
ANTICOAGULATION CONSULT NOTE - Initial Consult  Pharmacy Consult for Coumadin   Indication: atrial fibrillation  Allergies  Allergen Reactions  . Sulfonamide Derivatives Other (See Comments)    Felt funny    Patient Measurements: Height: 4\' 11"  (149.9 cm) Weight: 245 lb 13 oz (111.5 kg) IBW/kg (Calculated) : 43.2   Vital Signs: Temp: 98.2 F (36.8 C) (09/10 2015) Temp src: Oral (09/10 2015) BP: 117/53 mmHg (09/10 2200) Pulse Rate: 111  (09/10 2100)  Labs:  Basename 02/09/12 1415  HGB 10.0*  HCT 33.4*  PLT 212  APTT --  LABPROT 29.2*  INR 2.71*  HEPARINUNFRC --  CREATININE 0.63  CKTOTAL --  CKMB --  TROPONINI <0.30    Estimated Creatinine Clearance: 68.7 ml/min (by C-G formula based on Cr of 0.63).   Medical History: Past Medical History  Diagnosis Date  . Hypertension     x 5 years  . Paroxysmal atrial fibrillation   . Anxiety   . Crohn's disease   . 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 No clear documentation of this  . Diabetes mellitus   . Neuromuscular disorder     periph neuropathy  . Shortness of breath   . Arthritis   . GERD (gastroesophageal reflux disease)   . Blood transfusion   . COPD (chronic obstructive pulmonary disease)   . Cirrhosis     non aloholic     Medications:  Scheduled:    . albuterol  2.5 mg Nebulization Q6H  . ALPRAZolam  0.25-0.5 mg Oral BID  . diltiazem      . diltiazem  10 mg Intravenous Once  . diltiazem  180 mg Oral Daily  . ferrous sulfate  325 mg Oral Q breakfast  . furosemide  40 mg Intravenous Once  . furosemide  40 mg Intravenous Q12H  . insulin aspart  0-15 Units Subcutaneous TID WC  . magnesium sulfate 1 - 4 g bolus IVPB  2 g Intravenous Once  . mesalamine  800 mg Oral BID  . multivitamin with minerals  1 tablet Oral Daily  . pantoprazole  40 mg Oral Daily  . potassium chloride  20 mEq Oral BID  . potassium chloride  40 mEq Oral Once  . sodium chloride  3 mL Intravenous Q12H    . sodium chloride  3 mL Intravenous Q12H  . Vitamin D (Ergocalciferol)  50,000 Units Oral Weekly  . warfarin  1 mg Oral Custom  . warfarin  1.5 mg Oral Custom  . Warfarin - Pharmacist Dosing Inpatient   Does not apply q1800    Assessment: 76 yo F on chronic warfarin for Afib. INR therapeutic on admission. No bleeding noted.   Goal of Therapy:  INR 2-3   Plan:  Continue home Coumadin regimen.  Daily INR  Hatcher Froning, [facility] 02/09/2012,10:39 PM

## 2012-02-09 NOTE — ED Notes (Signed)
Report given of 14 lb weight gain over the past month, sob, 3+pitting edema bilateral lower extremity, HR 120-140 given to triage nurse.

## 2012-02-09 NOTE — H&P (Addendum)
[facility] History and Physical  [name] YQM:[id number] DOB: Mar 15, 1936 DOA: 02/09/2012  Referring physician: Dr Judd Lien PCP: Rudi Heap, MD   Chief Complaint:  shortness of breath x 2-3 days Weight gain x 4 weeks  With increased leg swelling  HPI:  76 y/o obese  female  With hx of  HTN, paroxysmal afib on coumadin, crohn's disease, DM, COPD, GERD, non etoh cirrhosis and hx of dietary non compliance came to the ED with increasing SOB with PND since past 3 days.s he informs having some sore throat 3 days back. She informs gaining about 14 lbs over past 4 weeks and has been non compliant to her diet.  She denies any chest pain, palpitations, fever, productive cough, abdominal pain, nausea, vomiting, bowel or urinary symptoms. In the ED a CXR obtained showed possible infiltrate and patient noted to be in afib with RVR with rate controled on giving a dose of  IV cardizem. [facility] called for admission toe telemetry.   Review of Systems:  Constitutional: Denies fever, chills, diaphoresis, appetite change , fatigue+.  HEENT: congestion, sore throat, Denies photophobia, eye pain, redness, hearing loss, ear pain,  rhinorrhea, sneezing, mouth sores, trouble swallowing, neck pain, neck stiffness and tinnitus.   Respiratory:  SOB, DOE, PND  Denies cough, chest tightness,  and wheezing.   Cardiovascular: Denies chest pain, palpitations, Chronic  leg swelling.  Gastrointestinal: Denies nausea, vomiting, abdominal pain, diarrhea, constipation, blood in stool and abdominal distention.  Genitourinary: Denies dysuria, urgency, frequency, hematuria, flank pain and difficulty urinating.  Musculoskeletal: Denies myalgias, back pain, joint swelling, arthralgias and gait problem.  Skin: Denies pallor, rash and wound.  Neurological: Denies dizziness, seizures, syncope, weakness, light-headedness, numbness and headaches.  Hematological: Denies adenopathy. Easy bruising, personal or family  bleeding history  Psychiatric/Behavioral: Denies suicidal ideation, mood changes, confusion, nervousness, sleep disturbance and agitation   Past Medical History  Diagnosis Date  . Hypertension     x 5 years  . Paroxysmal atrial fibrillation   . Anxiety   . Crohn's disease   . 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 No clear documentation of this  . Diabetes mellitus   . Neuromuscular disorder     periph neuropathy  . Shortness of breath   . Arthritis   . GERD (gastroesophageal reflux disease)   . Blood transfusion   . COPD (chronic obstructive pulmonary disease)   . Cirrhosis     non aloholic    Past Surgical History  Procedure Date  . Basal cell carcinoma excision   . Abdominal surgery     multiple  . Vesicovaginal fistula closure w/ tah   . Cancers resected   . Appendectomy   . Abdominal hysterectomy   . Tubal ligation   . Cholecystectomy   . Colon surgery     hx bleeding ulcer  . Mass excision 06/23/2011    Procedure: EXCISION MASS;  Surgeon: Tami Ribas, MD;  Location: [facility];  Service: Orthopedics;  Laterality: Left;  left hand excision mass with acell placement  . Left hand 06/23/11    Removed Squamous cell , and also did graft  . Mass excision 11/02/2011    Procedure: EXCISION MASS;  Surgeon: Tami Ribas, MD;  Location:  SURGERY CENTER;  Service: Orthopedics;  Laterality: Left;  EXCISION MASS LEFT HAND with full thickness skin graft from left upper arm   Social History:  reports that she has been smoking Cigarettes.  She has a 20 pack-year smoking history. She has never used smokeless tobacco. She reports that she does not drink alcohol or use illicit drugs.  Allergies  Allergen Reactions  . Sulfonamide Derivatives Other (See Comments)    Felt funny    Family History  Problem Relation Age of Onset  . Coronary artery disease Father   . Heart attack Father   . Coronary artery disease Mother   . Coronary  artery disease Brother   . Heart attack Brother   . Pancreatic cancer Brother   . Heart attack Brother   . Leukemia Daughter   . Healthy Daughter     Prior to Admission medications   Medication Sig Start Date End Date Taking? Authorizing Provider  acetaminophen (TYLENOL) 500 MG tablet Take 500 mg by mouth every 8 (eight) hours as needed. Pain   Yes Historical Provider, MD  ALPRAZolam (XANAX) 0.5 MG tablet Take 0.25-0.5 mg by mouth 2 (two) times daily. Anxiety   Yes Historical Provider, MD  Cyanocobalamin (VITAMIN B-12 IJ) Inject as directed every 30 (thirty) days.     Yes Historical Provider, MD  diltiazem (DILACOR XR) 180 MG 24 hr capsule Take 180 mg by mouth daily.   Yes Historical Provider, MD  ergocalciferol (VITAMIN D2) 50000 UNITS capsule Take 50,000 Units by mouth once a week. Takes on Mondays   Yes Historical Provider, MD  ferrous sulfate 325 (65 FE) MG tablet Take 325 mg by mouth. Patient takes this medication 3 times per week   Yes Historical Provider, MD  lansoprazole (PREVACID) 30 MG capsule Take 30 mg by mouth daily.   Yes Historical Provider, MD  Mesalamine (ASACOL HD) 800 MG TBEC Take 1 tablet by mouth 2 (two) times daily.     Yes Historical Provider, MD  metFORMIN (GLUCOPHAGE) 1000 MG tablet Take 1,000 mg by mouth 2 (two) times daily with a meal.     Yes Historical Provider, MD  Multiple Vitamin (MULTIVITAMIN) tablet Take 1 tablet by mouth daily.     Yes Historical Provider, MD  potassium chloride (KLOR-CON) 10 MEQ CR tablet Take 10 mEq by mouth 2 (two) times a week.     Yes Historical Provider, MD  triamterene-hydrochlorothiazide (MAXZIDE) 75-50 MG per tablet Take 1 tablet by mouth daily.     Yes Historical Provider, MD  warfarin (COUMADIN) 1 MG tablet Take 1 mg by mouth daily. Takes 1 mg everyday except for Mon and Fri when she takes 1.5 mg.   Yes Historical Provider, MD    Physical Exam:  Filed Vitals:   02/09/12 1337 02/09/12 1402 02/09/12 1548 02/09/12 1647  BP:  134/79  125/58 119/73  Pulse: 110  105 105  Temp: 98.5 F (36.9 C)     Resp: 24  21 23   Height:      Weight:      SpO2: 94% 98% 99% 100%    Constitutional: Vital signs reviewed.  Patient is an obese female lying in bed in NAD Ear: TM normal bilaterally Mouth: no erythema or exudates, MMM Eyes: PERRL, EOMI, conjunctivae normal, No scleral icterus.  Neck: Supple, Trachea midline normal ROM, jvd +, mass, thyromegaly, or carotid bruit present.  Cardiovascular: S1 , S2 irregular, no MRG, pulses symmetric and intact bilaterally Pulmonary/Chest: diminished breath sounds at bases. No added sounds Abdominal: Soft. Non-tender, non-distended, bowel sounds are normal, no masses, organomegaly, or guarding present.  GU: no CVA tenderness Musculoskeletal: No joint deformities, erythema, or stiffness, ROM full and no nontender Ext:  chronic venous changes with edema,  no cyanosis, pulses palpable bilaterally (DP and PT) Hematology: no cervical, inginal, or axillary adenopathy.  Neurological: A&O x3, Strenght is normal and symmetric bilaterally, cranial nerve II-XII are grossly intact, no focal motor deficit, sensory intact to light touch bilaterally.  Skin: Warm, dry and intact. No rash, cyanosis, or clubbing.  Psychiatric: Normal mood and affect. speech and behavior is normal. Judgment and thought content normal. Cognition and memory are normal.   Labs on Admission:  Basic Metabolic Panel:  Lab 02/09/12 6213  NA 135  K 3.1*  CL 97  CO2 32  GLUCOSE 97  BUN 6  CREATININE 0.63  CALCIUM 8.7  MG --  PHOS --   Liver Function Tests:  Lab 02/09/12 1415  AST 27  ALT 14  ALKPHOS 112  BILITOT 0.6  PROT 6.6  ALBUMIN 2.5*   No results found for this basename: LIPASE:5,AMYLASE:5 in the last 168 hours No results found for this basename: AMMONIA:5 in the last 168 hours CBC:  Lab 02/09/12 1415  WBC 5.0  NEUTROABS 3.1  HGB 10.0*  HCT 33.4*  MCV 78.0  PLT 212   Cardiac Enzymes:  Lab  02/09/12 1415  CKTOTAL --  CKMB --  CKMBINDEX --  TROPONINI <0.30   BNP: No components found with this basename: POCBNP:5 CBG: No results found for this basename: GLUCAP:5 in the last 168 hours  Radiological Exams on Admission: Dg Chest Port 1 View  02/09/2012  *RADIOLOGY REPORT*  Clinical Data: Shortness of breath.  PORTABLE CHEST - 1 VIEW  Comparison: 05/21/2009  Findings: Heart is mildly enlarged.  Patchy bilateral lobe airspace opacities, right greater than left.  Cannot exclude pneumonia, particularly in the right lung base.  No effusions.  No acute bony abnormality.  IMPRESSION: Bilateral lower lobe airspace opacities, right greater than left, cannot exclude pneumonia, particularly in the right lung base.  Stable cardiomegaly.   Original Report Authenticated By: Cyndie Chime, M.D.     EKG: afib @94 , no ST-T changes  Assessment/Plan  Principle problem:  Acute CHF exacerbation: Patient has chronic leg edema but informs gaining about 14-15 lbs over past  4 weeks and increased SOB with PND since past 3 days . Patient desated to 66s on RA.  -as per outpatient notes she does have chronic leg swellings and dietary non compliance with chronic shortness of breath and minimally ambulatory. This  suggest her symptoms could be worsening of her chronic underlying issues. Pro BNP of 475 only.  will diurese with IV lasix. CXR suggests possible infiltrate but does not have any symptoms except for some sore throat few days back. Will not start on any abx at this time. -will check 2D echo. Last echo from 10/12 was wnl. Follows with Dr Luciano Cutter from [facility]. Please call cardiology in am. -monitor strict I/O and daily weight. counseled on dietary adherence. -replenish k. Mg level ordered.  Active problems:  Afib with RVR  HR in 120s-130 ordered for 10 mg IV cardizem. Will resume home dose fo Cardizem and prn IV cardizem pushes. does not need cardizem drip at present. Monitor  in  tele -patient in on low dose coumadin at home. Check INR. Dose adjust per pharmacy  -replenish k and mg -follow with 2 D echo  obesity  associated with dietary non compliance  nutrition consult for calorie count   DM Check fsg and SSI. Will hold metformin Check AQC     CROHN'S DISEASE -follows with GI  Continue mesalamine  Hx of Non alcoholic cirrhosis  follows with GI Check liver function wnl  Hypokalemia  on chronic kcl supplements. Will replenish. Low mg of 1.5 . Ordered 2 gm IV mg sulfate.   COPD Not on any meds  will order nebs  Obesity  Counseled on dietary adherence  nutrition consult  Anemia  chronic associated iron and  B12 def. On monthly injection  DVT prophylaxis. On chronic coumadin. Check INR   Code Status: full Family Communication: no one  at bedside Disposition Plan: likely home once stable  Eddie North [facility] Pager [phone]  If 7PM-7AM, please contact night-coverage [email or web address] Password [facility] 02/09/2012, 6:23 PM      Total time spent: 70 minutes

## 2012-02-09 NOTE — ED Provider Notes (Signed)
History    This chart was scribed for Geoffery Lyons, MD, MD by Smitty Pluck. The patient was seen in room APA19 and the patient's care was started at 2:09PM.   CSN: [id number]  Arrival date & time 02/09/12  1310   First MD Initiated Contact with Patient 02/09/12 1409     Chief Complaint  Patient presents with  . Shortness of Breath  . Leg Swelling    (Consider location/radiation/quality/duration/timing/severity/associated sxs/prior treatment) Patient is a 76 y.o. female presenting with shortness of breath. The history is provided by the patient. No language interpreter was used.  Shortness of Breath    [name] is a 76 y.o. female who presents to the Emergency Department complaining of constant, moderate SOB onset 2-3 days ago and bilateral leg swelling onset past few month. Walking aggravates the SOB. Pt reports having non productive cough. Pt denies chest pain, n/v/d and fever. Pt reports that she has gained weight over the past couple of months. Pt has history of COPD, HTN, DM and CAD.   Past Medical History  Diagnosis Date  . Hypertension     x 5 years  . Paroxysmal atrial fibrillation   . Anxiety   . Crohn's disease   . 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 No clear documentation of this  . Diabetes mellitus   . Neuromuscular disorder     periph neuropathy  . Shortness of breath   . Arthritis   . GERD (gastroesophageal reflux disease)   . Blood transfusion   . COPD (chronic obstructive pulmonary disease)   . Cirrhosis     non aloholic     Past Surgical History  Procedure Date  . Basal cell carcinoma excision   . Abdominal surgery     multiple  . Vesicovaginal fistula closure w/ tah   . Cancers resected   . Appendectomy   . Abdominal hysterectomy   . Tubal ligation   . Cholecystectomy   . Colon surgery     hx bleeding ulcer  . Mass excision 06/23/2011    Procedure: EXCISION MASS;  Surgeon: Tami Ribas, MD;  Location: [facility];  Service: Orthopedics;  Laterality: Left;  left hand excision mass with acell placement  . Left hand 06/23/11    Removed Squamous cell , and also did graft  . Mass excision 11/02/2011    Procedure: EXCISION MASS;  Surgeon: Tami Ribas, MD;  Location: [facility];  Service: Orthopedics;  Laterality: Left;  EXCISION MASS LEFT HAND with full thickness skin graft from left upper arm    Family History  Problem Relation Age of Onset  . Coronary artery disease Father   . Heart attack Father   . Coronary artery disease Mother   . Coronary artery disease Brother   . Heart attack Brother   . Pancreatic cancer Brother   . Heart attack Brother   . Leukemia Daughter   . Healthy Daughter     History  Substance Use Topics  . Smoking status: Current Everyday Smoker -- 0.5 packs/day for 40 years    Types: Cigarettes  . Smokeless tobacco: Never Used  . Alcohol Use: No    OB History    Grav Para Term Preterm Abortions TAB SAB Ect Mult Living                  Review of Systems  All other systems reviewed and are negative.  10 Systems reviewed and all are negative for acute change except as noted in the HPI.   Allergies  Sulfonamide derivatives  Home Medications   Current Outpatient Rx  Name Route Sig Dispense Refill  . ACETAMINOPHEN 500 MG PO TABS Oral Take 500 mg by mouth every 8 (eight) hours as needed. Pain    . ALPRAZOLAM 0.5 MG PO TABS Oral Take 0.25-0.5 mg by mouth 2 (two) times daily. Anxiety    . VITAMIN B-12 IJ Injection Inject as directed every 30 (thirty) days.      Marland Kitchen DILTIAZEM HCL ER 180 MG PO CP24 Oral Take 180 mg by mouth daily.    . ERGOCALCIFEROL 50000 UNITS PO CAPS Oral Take 50,000 Units by mouth once a week. Takes on Mondays    . FERROUS SULFATE 325 (65 FE) MG PO TABS Oral Take 325 mg by mouth. Patient takes this medication 3 times per week    . LANSOPRAZOLE 30 MG PO CPDR Oral Take 30 mg by mouth daily.    Marland Kitchen MESALAMINE 800 MG PO  TBEC Oral Take 1 tablet by mouth 2 (two) times daily.      Marland Kitchen METFORMIN HCL 1000 MG PO TABS Oral Take 1,000 mg by mouth 2 (two) times daily with a meal.      . ONE-DAILY MULTI VITAMINS PO TABS Oral Take 1 tablet by mouth daily.      Marland Kitchen POTASSIUM CHLORIDE 10 MEQ PO TBCR Oral Take 10 mEq by mouth 2 (two) times a week.      . TRIAMTERENE-HCTZ 75-50 MG PO TABS Oral Take 1 tablet by mouth daily.      . WARFARIN SODIUM 1 MG PO TABS Oral Take 1 mg by mouth daily. Takes 1 mg everyday except for Mon and Fri when she takes 1.5 mg.      BP 134/79  Pulse 110  Temp 98.5 F (36.9 C)  Resp 24  Ht 4\' 11"  (1.499 m)  Wt 250 lb (113.399 kg)  BMI 50.49 kg/m2  SpO2 98%  Physical Exam  Nursing note and vitals reviewed. Constitutional: She is oriented to person, place, and time. She appears well-developed and well-nourished.  HENT:  Head: Normocephalic and atraumatic.  Cardiovascular: Normal rate, regular rhythm and normal heart sounds.   Pulmonary/Chest: She has rales (bilaterally ).  Abdominal: Soft. She exhibits no distension. There is no tenderness. There is no rebound.  Musculoskeletal:       Right upper leg: She exhibits edema.       Left upper leg: She exhibits edema.       Right lower leg: She exhibits edema.       Left lower leg: She exhibits edema.       +3 pitting edema    Neurological: She is alert and oriented to person, place, and time.  Skin: Skin is warm and dry.  Psychiatric: She has a normal mood and affect. Her behavior is normal.    ED Course  Procedures (including critical care time) DIAGNOSTIC STUDIES: Oxygen Saturation is 98% on [place], normal by my interpretation.    COORDINATION OF CARE: 2:13 PM Discussed pt ED treatment with pt  2:17 PM Ordered:   Medications  warfarin (COUMADIN) 1 MG tablet (not administered)  lansoprazole (PREVACID) 30 MG capsule (not administered)  diltiazem (DILACOR XR) 180 MG 24 hr capsule (not administered)  furosemide (LASIX) injection 40 mg (40  mg Intravenous Given 02/09/12 1447)       Labs Reviewed  CBC WITH  DIFFERENTIAL - Abnormal; Notable for the following:    Hemoglobin 10.0 (*)     HCT 33.4 (*)     MCH 23.4 (*)     MCHC 29.9 (*)     RDW 19.2 (*)     Monocytes Relative 16 (*)     All other components within normal limits  COMPREHENSIVE METABOLIC PANEL - Abnormal; Notable for the following:    Potassium 3.1 (*)     Albumin 2.5 (*)     GFR calc non Af Amer 86 (*)     All other components within normal limits  PRO B NATRIURETIC PEPTIDE - Abnormal; Notable for the following:    Pro B Natriuretic peptide (BNP) 475.2 (*)     All other components within normal limits  TROPONIN I   Dg Chest Port 1 View  02/09/2012  *RADIOLOGY REPORT*  Clinical Data: Shortness of breath.  PORTABLE CHEST - 1 VIEW  Comparison: 05/21/2009  Findings: Heart is mildly enlarged.  Patchy bilateral lobe airspace opacities, right greater than left.  Cannot exclude pneumonia, particularly in the right lung base.  No effusions.  No acute bony abnormality.  IMPRESSION: Bilateral lower lobe airspace opacities, right greater than left, cannot exclude pneumonia, particularly in the right lung base.  Stable cardiomegaly.   Original Report Authenticated By: Cyndie Chime, M.D.      No diagnosis found.   Date: 02/09/2012  Rate: 94  Rhythm: atrial fibrillation  QRS Axis: normal  Intervals: normal  ST/T Wave abnormalities: normal  Conduction Disutrbances:none  Narrative Interpretation:   Old EKG Reviewed: unchanged    MDM  The patient presents with doe, le edema for the past several days.  She has a history of chf, afib.  The workup today reveals an elevated bnp, chest xray and exam that I believe that is consistent with chf.  She becomes hypoxic when taken off of oxygen.  I will admit her to the triad service.        I personally performed the services described in this documentation, which was scribed in my presence. The recorded information has  been reviewed and considered.      Geoffery Lyons, MD 02/09/12 [phone]

## 2012-02-10 DIAGNOSIS — E8809 Other disorders of plasma-protein metabolism, not elsewhere classified: Secondary | ICD-10-CM | POA: Diagnosis present

## 2012-02-10 DIAGNOSIS — Z7901 Long term (current) use of anticoagulants: Secondary | ICD-10-CM

## 2012-02-10 DIAGNOSIS — I878 Other specified disorders of veins: Secondary | ICD-10-CM | POA: Diagnosis present

## 2012-02-10 DIAGNOSIS — R5381 Other malaise: Secondary | ICD-10-CM | POA: Diagnosis present

## 2012-02-10 DIAGNOSIS — E876 Hypokalemia: Secondary | ICD-10-CM | POA: Diagnosis present

## 2012-02-10 DIAGNOSIS — J441 Chronic obstructive pulmonary disease with (acute) exacerbation: Secondary | ICD-10-CM | POA: Diagnosis present

## 2012-02-10 DIAGNOSIS — I517 Cardiomegaly: Secondary | ICD-10-CM

## 2012-02-10 DIAGNOSIS — R0600 Dyspnea, unspecified: Secondary | ICD-10-CM | POA: Diagnosis present

## 2012-02-10 DIAGNOSIS — R0609 Other forms of dyspnea: Secondary | ICD-10-CM

## 2012-02-10 DIAGNOSIS — K7460 Unspecified cirrhosis of liver: Secondary | ICD-10-CM

## 2012-02-10 DIAGNOSIS — J189 Pneumonia, unspecified organism: Principal | ICD-10-CM

## 2012-02-10 LAB — HEMOGLOBIN A1C: Mean Plasma Glucose: 128 mg/dL — ABNORMAL HIGH (ref <117)

## 2012-02-10 LAB — BASIC METABOLIC PANEL
Chloride: 98 mEq/L (ref 96–112)
GFR calc Af Amer: >90 mL/min (ref >90)
GFR calc non Af Amer: 83 mL/min — ABNORMAL LOW (ref >90)
Potassium: 3.1 mEq/L — ABNORMAL LOW (ref 3.5–5.1)
Sodium: 140 mEq/L (ref 135–145)

## 2012-02-10 LAB — GLUCOSE, CAPILLARY
Glucose-Capillary: 125 mg/dL — ABNORMAL HIGH (ref 70–99)
Glucose-Capillary: 112 mg/dL — ABNORMAL HIGH (ref 70–99)

## 2012-02-10 MED ORDER — GUAIFENESIN ER 600 MG PO TB12
600.0000 mg | ORAL_TABLET | Freq: Two times a day (BID) | ORAL | Status: DC
  Administered 2012-02-10 – 2012-02-15 (×11): 600 mg via ORAL
  Filled 2012-02-10 (×11): qty 1

## 2012-02-10 MED ORDER — POTASSIUM CHLORIDE CRYS ER 20 MEQ PO TBCR
40.0000 meq | EXTENDED_RELEASE_TABLET | Freq: Three times a day (TID) | ORAL | Status: DC
  Administered 2012-02-10 – 2012-02-11 (×4): 40 meq via ORAL
  Filled 2012-02-10 (×4): qty 2

## 2012-02-10 MED ORDER — AZITHROMYCIN 250 MG PO TABS
250.0000 mg | ORAL_TABLET | Freq: Every day | ORAL | Status: AC
  Administered 2012-02-11 – 2012-02-14 (×4): 250 mg via ORAL
  Filled 2012-02-10 (×4): qty 1

## 2012-02-10 MED ORDER — AZITHROMYCIN 250 MG PO TABS
500.0000 mg | ORAL_TABLET | Freq: Every day | ORAL | Status: AC
  Administered 2012-02-10: 500 mg via ORAL
  Filled 2012-02-10: qty 2

## 2012-02-10 MED ORDER — FUROSEMIDE 10 MG/ML IJ SOLN
40.0000 mg | Freq: Two times a day (BID) | INTRAMUSCULAR | Status: DC
  Administered 2012-02-10 – 2012-02-14 (×8): 40 mg via INTRAVENOUS
  Filled 2012-02-10 (×8): qty 4

## 2012-02-10 MED ORDER — IPRATROPIUM BROMIDE 0.02 % IN SOLN
0.5000 mg | Freq: Four times a day (QID) | RESPIRATORY_TRACT | Status: DC
  Administered 2012-02-10 – 2012-02-13 (×13): 0.5 mg via RESPIRATORY_TRACT
  Filled 2012-02-10 (×13): qty 2.5

## 2012-02-10 MED ORDER — DEXTROSE 5 % IV SOLN
1.0000 g | INTRAVENOUS | Status: DC
  Administered 2012-02-10 – 2012-02-15 (×6): 1 g via INTRAVENOUS
  Filled 2012-02-10 (×7): qty 10

## 2012-02-10 MED ORDER — LEVALBUTEROL HCL 0.63 MG/3ML IN NEBU
0.6300 mg | INHALATION_SOLUTION | Freq: Four times a day (QID) | RESPIRATORY_TRACT | Status: DC
  Administered 2012-02-10 – 2012-02-13 (×13): 0.63 mg via RESPIRATORY_TRACT
  Filled 2012-02-10 (×14): qty 3

## 2012-02-10 MED ORDER — METFORMIN HCL 500 MG PO TABS
1000.0000 mg | ORAL_TABLET | Freq: Two times a day (BID) | ORAL | Status: DC
  Administered 2012-02-10 – 2012-02-15 (×11): 1000 mg via ORAL
  Filled 2012-02-10 (×11): qty 2

## 2012-02-10 MED ORDER — NYSTATIN 100000 UNIT/GM EX POWD
Freq: Two times a day (BID) | CUTANEOUS | Status: DC
  Administered 2012-02-10 – 2012-02-13 (×7): via TOPICAL
  Administered 2012-02-13: 1 g via TOPICAL
  Administered 2012-02-14 – 2012-02-15 (×3): via TOPICAL
  Filled 2012-02-10: qty 15

## 2012-02-10 MED ORDER — TRIAMTERENE-HCTZ 75-50 MG PO TABS
1.0000 | ORAL_TABLET | Freq: Every day | ORAL | Status: DC
  Administered 2012-02-10 – 2012-02-11 (×2): 1 via ORAL
  Filled 2012-02-10 (×2): qty 1

## 2012-02-10 MED ORDER — SODIUM CHLORIDE 0.9 % IN NEBU
INHALATION_SOLUTION | RESPIRATORY_TRACT | Status: AC
  Administered 2012-02-10: 3 mL
  Filled 2012-02-10: qty 3

## 2012-02-10 MED ORDER — LEVALBUTEROL HCL 0.63 MG/3ML IN NEBU: 0.6300 mg | INHALATION_SOLUTION | Freq: Four times a day (QID) | RESPIRATORY_TRACT | Status: DC

## 2012-02-10 NOTE — Progress Notes (Signed)
Patient had episode about 0120 where patient desat down to 70s, during a period of apnea of about 20 sec. Patients oxygen was increased to 3L. Woke patient up and sats increased back to 90s. Will continue to monitor.

## 2012-02-10 NOTE — Progress Notes (Signed)
ANTICOAGULATION CONSULT NOTE - Initial Consult  Pharmacy Consult for Coumadin   Indication: atrial fibrillation  Allergies  Allergen Reactions  . Sulfonamide Derivatives Other (See Comments)    Felt funny    Patient Measurements: Height: 4\' 11"  (149.9 cm) Weight: 248 lb 0.3 oz (112.5 kg) IBW/kg (Calculated) : 43.2   Vital Signs: Temp: 98 F (36.7 C) (09/11 0800) Temp src: Oral (09/11 0800) BP: 114/57 mmHg (09/11 0600) Pulse Rate: 112  (09/11 0600)  Labs:  Basename 02/10/12 0438 02/09/12 1415  HGB -- 10.0*  HCT -- 33.4*  PLT -- 212  APTT -- --  LABPROT 30.9* 29.2*  INR 2.91* 2.71*  HEPARINUNFRC -- --  CREATININE 0.72 0.63  CKTOTAL -- --  CKMB -- --  TROPONINI -- <0.30    Estimated Creatinine Clearance: 69.1 ml/min (by C-G formula based on Cr of 0.72).   Medical History: Past Medical History  Diagnosis Date  . Hypertension     x 5 years  . Paroxysmal atrial fibrillation   . Anxiety   . Crohn's disease   . 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 No clear documentation of this  . Diabetes mellitus   . Neuromuscular disorder     periph neuropathy  . Shortness of breath   . Arthritis   . GERD (gastroesophageal reflux disease)   . Blood transfusion   . COPD (chronic obstructive pulmonary disease)   . Cirrhosis     non aloholic     Medications:  Scheduled:     . ALPRAZolam  0.25-0.5 mg Oral BID  . azithromycin  500 mg Oral Daily   Followed by  . azithromycin  250 mg Oral Daily  . cefTRIAXone (ROCEPHIN)  IV  1 g Intravenous Q24H  . diltiazem  180 mg Oral Daily  . diltiazem      . diltiazem  10 mg Intravenous Once  . ferrous sulfate  325 mg Oral Q breakfast  . furosemide  40 mg Intravenous Once  . furosemide  40 mg Intravenous BID  . guaiFENesin  600 mg Oral BID  . insulin aspart  0-15 Units Subcutaneous TID WC  . ipratropium  0.5 mg Nebulization QID  . levalbuterol  0.63 mg Nebulization QID  . magnesium sulfate 1 - 4 g bolus  IVPB  2 g Intravenous Once  . mesalamine  800 mg Oral BID  . metFORMIN  1,000 mg Oral BID WC  . multivitamin with minerals  1 tablet Oral Daily  . nystatin   Topical BID  . pantoprazole  40 mg Oral Daily  . potassium chloride  40 mEq Oral Once  . potassium chloride  40 mEq Oral TID  . sodium chloride      . triamterene-hydrochlorothiazide  1 tablet Oral Daily  . Vitamin D (Ergocalciferol)  50,000 Units Oral Weekly  . warfarin  1 mg Oral Custom  . warfarin  1.5 mg Oral Custom  . Warfarin - Pharmacist Dosing Inpatient   Does not apply q1800  . DISCONTD: albuterol  2.5 mg Nebulization Q6H  . DISCONTD: furosemide  40 mg Intravenous Q12H  . DISCONTD: levalbuterol  0.63 mg Nebulization QID  . DISCONTD: potassium chloride  20 mEq Oral BID  . DISCONTD: sodium chloride  3 mL Intravenous Q12H  . DISCONTD: sodium chloride  3 mL Intravenous Q12H    Assessment: 76 yo F on chronic warfarin 1mg  daily except 1.5mg  on Mon, Fri for Afib. INR therapeutic on admission.  It remains therapeutic but is increasing possibly due to acute illness. No bleeding noted.   Goal of Therapy:  INR 2-3   Plan:  Continue home Coumadin regimen.  Daily INR  Thomasa Heidler, [facility] 02/10/2012,10:39 AM

## 2012-02-10 NOTE — Progress Notes (Signed)
  Echocardiogram 2D Echocardiogram has been performed.  Antony Salmon, RCS 02/10/2012, 12:14 PM

## 2012-02-10 NOTE — Evaluation (Signed)
Physical Therapy Evaluation Patient Details Name: [name] MRN: [id number] DOB: 03-07-1936 Today's Date: 02/10/2012 Time: 1450-1531 PT Time Calculation (min): 41 min  PT Assessment / Plan / Recommendation Clinical Impression  Pt is very pleasant and cooperative, obese.  She states that her LEs have retained a lot of fluid, and therefore her legs have been difficult to move.  She is extremely deconditioned and only able to ambulate about 5' with walker.  She has weakness in R LE.  I am concerned about her ability to manage at home and feel that SNF would be most appropriate.  She states that she doesn't want to leave her granddaughter  and therefore wants to go home at d/c.  She will need HHPT if she goes home.    PT Assessment  Patient needs continued PT services    Follow Up Recommendations  Home health PT;Skilled nursing facility    Barriers to Discharge Decreased caregiver support      Equipment Recommendations  Defer to next venue    Recommendations for Other Services OT consult   Frequency Min 3X/week    Precautions / Restrictions Precautions Precautions: Fall Restrictions Weight Bearing Restrictions: No   Pertinent Vitals/Pain       Mobility  Bed Mobility Bed Mobility: Sit to Supine;Supine to Sit Supine to Sit: 4: Min guard;HOB elevated Sit to Supine: Not Tested (comment) Transfers Transfers: Sit to Stand;Stand to Sit Sit to Stand: 4: Min assist;With upper extremity assist;From chair/3-in-1;From bed Stand to Sit: 4: Min guard;To chair/3-in-1 Ambulation/Gait Ambulation/Gait Assistance: 4: Min assist Ambulation Distance (Feet): 5 Feet (x2) Assistive device: Rolling walker Ambulation/Gait Assistance Details: gait is extremely labored with significant weight bearing on walker.  Pt has difficulty advancing RLE during gait Gait Pattern: Step-through pattern;Decreased stance time - left;Decreased step length - right;Trunk flexed Gait velocity: very slow and  labored Stairs: No Wheelchair Mobility Wheelchair Mobility: No    Exercises     PT Diagnosis: Difficulty walking;Abnormality of gait;Generalized weakness  PT Problem List: Decreased strength;Decreased activity tolerance;Decreased mobility;Decreased knowledge of use of DME;Decreased safety awareness;Obesity PT Treatment Interventions: Gait training;Functional mobility training;Stair training;Therapeutic exercise;Patient/family education   PT Goals Acute Rehab PT Goals PT Goal Formulation: With patient Time For Goal Achievement: 02/24/12 Potential to Achieve Goals: Good Pt will go Sit to Stand: with modified independence;with upper extremity assist PT Goal: Sit to Stand - Progress: Goal set today Pt will go Stand to Sit: with modified independence PT Goal: Stand to Sit - Progress: Goal set today Pt will Ambulate: 16 - 50 feet;with least restrictive assistive device;with min assist PT Goal: Ambulate - Progress: Goal set today Pt will Go Up / Down Stairs: 3-5 stairs;with min assist;with rail(s) PT Goal: Up/Down Stairs - Progress: Goal set today  Visit Information  Last PT Received On: 02/10/12    Subjective Data  Subjective: I'm tired of lying in bed Patient Stated Goal: return home, able to walk better   Prior Functioning  Home Living Lives With: Family Available Help at Discharge: Family Type of Home: House Home Access: Stairs to enter Secretary/administrator of Steps: 5 Entrance Stairs-Rails: Right Home Layout: One level Bathroom Shower/Tub: Health visitor: Standard Home Adaptive Equipment: Grab bars in shower;Walker - rolling;Straight cane Prior Function Level of Independence: Independent Able to Take Stairs?: Yes Driving: Yes Vocation: Retired Comments: pt lives with her 88 year old granddaughter...from the patient's description, her mobility was limited at baseline, although she did not use any device for gait Communication  Communication: No  difficulties    Cognition  Overall Cognitive Status: Appears within functional limits for tasks assessed/performed Arousal/Alertness: Awake/alert Orientation Level: Appears intact for tasks assessed Behavior During Session: [facility] for tasks performed    Extremity/Trunk Assessment Right Lower Extremity Assessment RLE ROM/Strength/Tone: Deficits RLE ROM/Strength/Tone Deficits: strength at hip is 3-/5, knee=3/5 RLE Sensation: WFL - Light Touch Left Lower Extremity Assessment LLE ROM/Strength/Tone: WFL for tasks assessed LLE Sensation: WFL - Light Touch   Balance    End of Session PT - End of Session Equipment Utilized During Treatment: Gait belt Activity Tolerance: Patient limited by fatigue Patient left: in chair;with call bell/phone within reach Nurse Communication: Mobility status  GP     Konrad Penta 02/10/2012, 3:43 PM

## 2012-02-10 NOTE — Progress Notes (Signed)
UR Chart Review Completed  

## 2012-02-10 NOTE — Progress Notes (Signed)
Brief Calorie Count Note Received MD Consult for calorie count. Calorie count to begin at Lunch on 02/10/12 and to end at [facility] on 02/12/12.  Hung calorie count envelope on the patient's door. Spoke with RN to document percent consumed for each item on the patient's meal tray ticket and keep in envelope and also document percent of any supplement or snack pt consumes and keep documentation in envelope for RD to review.    Melody Haver, RD, LDN Pager: [phone]

## 2012-02-10 NOTE — Progress Notes (Addendum)
Chart reviewed.  Subjective: Shortness of breath. Cough. Had a sore throat and rhinorrhea that started last week. Requesting Mucinex. Wheezing. Wants to quit smoking. Swelling has been so bad she has difficulty getting up from a seated position. She has difficulty getting in and out of her car. She lives with her 74 year old granddaughter. Her daughter lives close by and checks on her periodically. No chest pain. Denies orthopnea. She reports that lying down actually helps her feel better.  Objective: Vital signs in last 24 hours: Filed Vitals:   02/10/12 0500 02/10/12 0600 02/10/12 0800 02/10/12 0832  BP: 108/71 114/57    Pulse: 97 112    Temp:   98 F (36.7 C)   TempSrc:   Oral   Resp: 20 21    Height:      Weight: 112.5 kg (248 lb 0.3 oz)     SpO2: 98% 98%  98%   Weight change:   Intake/Output Summary (Last 24 hours) at 02/10/12 5409 Last data filed at 02/10/12 0500  Gross per 24 hour  Intake     54 ml  Output   2500 ml  Net  -2446 ml   General: Appears short of breath with talking. Speaks in truncated sentences. Morbidly obese. Lungs: Bilateral rhonchi and congestion. Wheeze present. Prolonged expiratory phase. No rales. Cardiovascular: Irregularly irregular without murmurs gallops rubs Abdomen obese soft edema present. Caput medusa and umbilical hernia present. Extremities brawny edema with chronic venous stasis changes.  Lab Results: Basic Metabolic Panel:  Lab 02/10/12 8119 02/09/12 1415  NA 140 135  K 3.1* 3.1*  CL 98 97  CO2 38* 32  GLUCOSE 104* 97  BUN 6 6  CREATININE 0.72 0.63  CALCIUM 8.4 8.7  MG -- 1.5  PHOS -- --   Liver Function Tests:  Lab 02/09/12 1415  AST 27  ALT 14  ALKPHOS 112  BILITOT 0.6  PROT 6.6  ALBUMIN 2.5*   No results found for this basename: LIPASE:2,AMYLASE:2 in the last 168 hours No results found for this basename: AMMONIA:2 in the last 168 hours CBC:  Lab 02/09/12 1415  WBC 5.0  NEUTROABS 3.1  HGB 10.0*  HCT 33.4*    MCV 78.0  PLT 212   Cardiac Enzymes:  Lab 02/09/12 1415  CKTOTAL --  CKMB --  CKMBINDEX --  TROPONINI <0.30   BNP:  Lab 02/09/12 1415  PROBNP 475.2*   D-Dimer: No results found for this basename: DDIMER:2 in the last 168 hours CBG:  Lab 02/10/12 0743 02/09/12 2113  GLUCAP 92 184*   Hemoglobin A1C: No results found for this basename: HGBA1C in the last 168 hours Fasting Lipid Panel: No results found for this basename: CHOL,HDL,LDLCALC,TRIG,CHOLHDL,LDLDIRECT in the last 147 hours Thyroid Function Tests:  Lab 02/09/12 1415  TSH 2.355  T4TOTAL --  FREET4 --  T3FREE --  THYROIDAB --   Coagulation:  Lab 02/10/12 0438 02/09/12 1415  LABPROT 30.9* 29.2*  INR 2.91* 2.71*   Anemia Panel: No results found for this basename: VITAMINB12,FOLATE,FERRITIN,TIBC,IRON,RETICCTPCT in the last 168 hours Urine Drug Screen: Drugs of Abuse  No results found for this basename: labopia, cocainscrnur, labbenz, amphetmu, thcu, labbarb    Alcohol Level: No results found for this basename: ETH:2 in the last 168 hours Urinalysis: No results found for this basename: COLORURINE:2,APPERANCEUR:2,LABSPEC:2,PHURINE:2,GLUCOSEU:2,HGBUR:2,BILIRUBINUR:2,KETONESUR:2,PROTEINUR:2,UROBILINOGEN:2,NITRITE:2,LEUKOCYTESUR:2 in the last 168 hours  Micro Results: Recent Results (from the past 240 hour(s))  MRSA PCR SCREENING     Status: Normal   Collection Time  02/09/12  7:35 PM      Component Value Range Status Comment   MRSA by PCR NEGATIVE  NEGATIVE Final    Studies/Results: Dg Chest Port 1 View  02/09/2012  *RADIOLOGY REPORT*  Clinical Data: Shortness of breath.  PORTABLE CHEST - 1 VIEW  Comparison: 05/21/2009  Findings: Heart is mildly enlarged.  Patchy bilateral lobe airspace opacities, right greater than left.  Cannot exclude pneumonia, particularly in the right lung base.  No effusions.  No acute bony abnormality.  IMPRESSION: Bilateral lower lobe airspace opacities, right greater than left,  cannot exclude pneumonia, particularly in the right lung base.  Stable cardiomegaly.   Original Report Authenticated By: Cyndie Chime, M.D.    Scheduled Meds:   . albuterol  2.5 mg Nebulization Q6H  . ALPRAZolam  0.25-0.5 mg Oral BID  . diltiazem  180 mg Oral Daily  . diltiazem      . diltiazem  10 mg Intravenous Once  . ferrous sulfate  325 mg Oral Q breakfast  . furosemide  40 mg Intravenous Once  . furosemide  40 mg Intravenous Q12H  . insulin aspart  0-15 Units Subcutaneous TID WC  . magnesium sulfate 1 - 4 g bolus IVPB  2 g Intravenous Once  . mesalamine  800 mg Oral BID  . multivitamin with minerals  1 tablet Oral Daily  . pantoprazole  40 mg Oral Daily  . potassium chloride  20 mEq Oral BID  . potassium chloride  40 mEq Oral Once  . sodium chloride  3 mL Intravenous Q12H  . sodium chloride  3 mL Intravenous Q12H  . sodium chloride      . Vitamin D (Ergocalciferol)  50,000 Units Oral Weekly  . warfarin  1 mg Oral Custom  . warfarin  1.5 mg Oral Custom  . Warfarin - Pharmacist Dosing Inpatient   Does not apply q1800   Continuous Infusions:  PRN Meds:.sodium chloride, acetaminophen, albuterol, diltiazem, sodium chloride Assessment/Plan: Active Problems:  Edema  CAP (community acquired pneumonia)  Dyspnea  COPD exacerbation  DM  HYPERTENSION, UNSPECIFIED  Atrial fibrillation  Cirrhosis, nonalcoholic  Hypokalemia  Hypoalbuminemia  OVERWEIGHT/OBESITY  ANXIETY  CROHN'S DISEASE  Smoking  Debility  Venous stasis  Anticoagulant long-term use  probable sleep apnea  It is not entirely clear that patient's symptoms are related to left heart failure. The edema may be related to her cirrhosis and hypoalbuminemia. She also has COPD and probable undiagnosed sleep apnea, so she could have significant right heart failure. Her chest x-ray and clinical picture could be consistent with pneumonia. On exam, she has no rales, mainly rhonchi and wheezing. She has no orthopnea. Her  BNP is somewhat equivocal. She has no known history of CHF. However, will proceed with echocardiogram to look at right and left heart function. Continue IV Lasix twice daily. Continue potassium repletion. Will start her on Rocephin and azithromycin for community-acquired pneumonia. Change bronchodilators to Xopenex because of her atrial fibrillation. Add Mucinex per her request. Her albumin is 2.5 which is decreased from previous. She may have progressive changes from nonalcoholic fatty liver disease/erosive. Will also check a urinalysis to look for protein. Physical therapy. Will likely need home physical therapy and nursing.   LOS: 1 day   Ricke Kimoto L 02/10/2012, 9:22 AM

## 2012-02-11 LAB — GLUCOSE, CAPILLARY
Glucose-Capillary: 121 mg/dL — ABNORMAL HIGH (ref 70–99)
Glucose-Capillary: 169 mg/dL — ABNORMAL HIGH (ref 70–99)
Glucose-Capillary: 246 mg/dL — ABNORMAL HIGH (ref 70–99)

## 2012-02-11 LAB — URINALYSIS, ROUTINE W REFLEX MICROSCOPIC
Glucose, UA: NEGATIVE mg/dL
Specific Gravity, Urine: 1.01 (ref 1.005–1.030)
pH: 6 (ref 5.0–8.0)

## 2012-02-11 LAB — PROTIME-INR
INR: 2.58 — ABNORMAL HIGH (ref 0.00–1.49)
Prothrombin Time: 28.1 seconds — ABNORMAL HIGH (ref 11.6–15.2)

## 2012-02-11 MED ORDER — LEVALBUTEROL HCL 0.63 MG/3ML IN NEBU
0.6300 mg | INHALATION_SOLUTION | RESPIRATORY_TRACT | Status: DC | PRN
  Administered 2012-02-11 (×3): 0.63 mg via RESPIRATORY_TRACT
  Filled 2012-02-11 (×3): qty 3

## 2012-02-11 MED ORDER — SODIUM CHLORIDE 0.9 % IJ SOLN
INTRAMUSCULAR | Status: AC
  Administered 2012-02-11: 06:00:00
  Filled 2012-02-11: qty 3

## 2012-02-11 MED ORDER — POTASSIUM CHLORIDE CRYS ER 20 MEQ PO TBCR
40.0000 meq | EXTENDED_RELEASE_TABLET | Freq: Every day | ORAL | Status: DC
  Administered 2012-02-12 – 2012-02-15 (×4): 40 meq via ORAL
  Filled 2012-02-11 (×3): qty 2
  Filled 2012-02-11: qty 1

## 2012-02-11 MED ORDER — METHYLPREDNISOLONE SODIUM SUCC 125 MG IJ SOLR
125.0000 mg | Freq: Once | INTRAMUSCULAR | Status: AC
  Administered 2012-02-11: 125 mg via INTRAVENOUS

## 2012-02-11 MED ORDER — SPIRONOLACTONE 25 MG PO TABS
50.0000 mg | ORAL_TABLET | Freq: Two times a day (BID) | ORAL | Status: DC
  Administered 2012-02-11 – 2012-02-15 (×9): 50 mg via ORAL
  Filled 2012-02-11 (×9): qty 2

## 2012-02-11 MED ORDER — ALPRAZOLAM 0.5 MG PO TABS
0.5000 mg | ORAL_TABLET | Freq: Once | ORAL | Status: AC
  Administered 2012-02-11: 0.5 mg via ORAL
  Filled 2012-02-11: qty 1

## 2012-02-11 MED ORDER — METHYLPREDNISOLONE SODIUM SUCC 125 MG IJ SOLR
INTRAMUSCULAR | Status: AC
  Filled 2012-02-11: qty 2

## 2012-02-11 NOTE — Progress Notes (Signed)
Patient's heart rate was sustaining in the 130's. Cardizem IV was given. Will continue to monitor patient.

## 2012-02-11 NOTE — Progress Notes (Addendum)
Patient was wheezing loudly. Respiratory was called and new treatments were given. After about 45 minutes, patient's oxygen level was checked and it was 75% on 3 liters. Patient was put on a nonrebreather and doctor was notified. New orders were given for another breathing treatment and a one time dose of Solumedrol. Patient's oxygen stat went up to 96% on 4L. Will continue to monitor.

## 2012-02-11 NOTE — Progress Notes (Signed)
Subjective: Still with cough and congestion, but no sputum production. Felt slightly short of breath with exertion, but able to walk easier compared to previous. Seems that swelling has improved.  Objective: Vital signs in last 24 hours: Filed Vitals:   02/11/12 0536 02/11/12 0608 02/11/12 0731 02/11/12 0754  BP:  118/76  114/64  Pulse:  114  115  Temp:  97.7 F (36.5 C)  98.1 F (36.7 C)  TempSrc:    Oral  Resp:  22  24  Height:      Weight:      SpO2: 100% 92% 94% 92%   Weight change: -2.223 kg (-4 lb 14.4 oz)  Intake/Output Summary (Last 24 hours) at 02/11/12 1109 Last data filed at 02/11/12 0618  Gross per 24 hour  Intake    724 ml  Output   3575 ml  Net  -2851 ml   General: Appears short of breath with talking. Speaks in truncated sentences. Morbidly obese.  Lungs: Bilateral rhonchi and congestion. Wheeze present. Prolonged expiratory phase. No rales. Cardiovascular: Irregularly irregular without murmurs gallops rubs Abdomen obese slightly less edema Extremities brawny edema with chronic venous stasis changes. Slightly less edema in the thighs.  Lab Results: Basic Metabolic Panel:  Lab 02/10/12 1610 02/09/12 1415  NA 140 135  K 3.1* 3.1*  CL 98 97  CO2 38* 32  GLUCOSE 104* 97  BUN 6 6  CREATININE 0.72 0.63  CALCIUM 8.4 8.7  MG -- 1.5  PHOS -- --   Liver Function Tests:  Lab 02/09/12 1415  AST 27  ALT 14  ALKPHOS 112  BILITOT 0.6  PROT 6.6  ALBUMIN 2.5*   No results found for this basename: LIPASE:2,AMYLASE:2 in the last 168 hours No results found for this basename: AMMONIA:2 in the last 168 hours CBC:  Lab 02/09/12 1415  WBC 5.0  NEUTROABS 3.1  HGB 10.0*  HCT 33.4*  MCV 78.0  PLT 212   Cardiac Enzymes:  Lab 02/09/12 1415  CKTOTAL --  CKMB --  CKMBINDEX --  TROPONINI <0.30   BNP:  Lab 02/09/12 1415  PROBNP 475.2*   D-Dimer: No results found for this basename: DDIMER:2 in the last 168 hours CBG:  Lab 02/11/12 0756 02/10/12  2051 02/10/12 1618 02/10/12 1215 02/10/12 0743 02/09/12 2113  GLUCAP 121* 147* 112* 125* 92 184*   Hemoglobin A1C:  Lab 02/10/12 0438  HGBA1C 6.1*   Fasting Lipid Panel: No results found for this basename: CHOL,HDL,LDLCALC,TRIG,CHOLHDL,LDLDIRECT in the last 960 hours Thyroid Function Tests:  Lab 02/09/12 1415  TSH 2.355  T4TOTAL --  FREET4 --  T3FREE --  THYROIDAB --   Coagulation:  Lab 02/11/12 0506 02/10/12 0438 02/09/12 1415  LABPROT 28.1* 30.9* 29.2*  INR 2.58* 2.91* 2.71*   Anemia Panel: No results found for this basename: VITAMINB12,FOLATE,FERRITIN,TIBC,IRON,RETICCTPCT in the last 168 hours Urine Drug Screen: Drugs of Abuse  No results found for this basename: labopia,  cocainscrnur,  labbenz,  amphetmu,  thcu,  labbarb    Alcohol Level: No results found for this basename: ETH:2 in the last 168 hours Urinalysis:  Lab 02/10/12 2048  COLORURINE STRAW*  LABSPEC 1.010  PHURINE 6.0  GLUCOSEU NEGATIVE  HGBUR MODERATE*  BILIRUBINUR NEGATIVE  KETONESUR NEGATIVE  PROTEINUR NEGATIVE  UROBILINOGEN 0.2  NITRITE NEGATIVE  LEUKOCYTESUR TRACE*    Micro Results: Recent Results (from the past 240 hour(s))  MRSA PCR SCREENING     Status: Normal   Collection Time   02/09/12  7:35 PM      Component Value Range Status Comment   MRSA by PCR NEGATIVE  NEGATIVE Final    Studies/Results: Dg Chest Port 1 View  02/09/2012  *RADIOLOGY REPORT*  Clinical Data: Shortness of breath.  PORTABLE CHEST - 1 VIEW  Comparison: 05/21/2009  Findings: Heart is mildly enlarged.  Patchy bilateral lobe airspace opacities, right greater than left.  Cannot exclude pneumonia, particularly in the right lung base.  No effusions.  No acute bony abnormality.  IMPRESSION: Bilateral lower lobe airspace opacities, right greater than left, cannot exclude pneumonia, particularly in the right lung base.  Stable cardiomegaly.   Original Report Authenticated By: Cyndie Chime, M.D.    Scheduled Meds:    .  ALPRAZolam  0.25-0.5 mg Oral BID  . ALPRAZolam  0.5 mg Oral Once  . azithromycin  250 mg Oral Daily  . cefTRIAXone (ROCEPHIN)  IV  1 g Intravenous Q24H  . diltiazem  180 mg Oral Daily  . ferrous sulfate  325 mg Oral Q breakfast  . furosemide  40 mg Intravenous BID  . guaiFENesin  600 mg Oral BID  . insulin aspart  0-15 Units Subcutaneous TID WC  . ipratropium  0.5 mg Nebulization QID  . levalbuterol  0.63 mg Nebulization QID  . mesalamine  800 mg Oral BID  . metFORMIN  1,000 mg Oral BID WC  . methylPREDNISolone (SOLU-MEDROL) injection  125 mg Intravenous Once  . methylPREDNISolone sodium succinate      . multivitamin with minerals  1 tablet Oral Daily  . nystatin   Topical BID  . pantoprazole  40 mg Oral Daily  . potassium chloride  40 mEq Oral TID  . sodium chloride      . sodium chloride      . triamterene-hydrochlorothiazide  1 tablet Oral Daily  . Vitamin D (Ergocalciferol)  50,000 Units Oral Weekly  . warfarin  1 mg Oral Custom  . warfarin  1.5 mg Oral Custom  . Warfarin - Pharmacist Dosing Inpatient   Does not apply q1800   Continuous Infusions:  PRN Meds:.acetaminophen, diltiazem, levalbuterol Assessment/Plan: Active Problems:  Edema  CAP (community acquired pneumonia)  Dyspnea  COPD exacerbation  DM  HYPERTENSION, UNSPECIFIED  Atrial fibrillation  Cirrhosis, nonalcoholic  Hypokalemia  Hypoalbuminemia  OVERWEIGHT/OBESITY  ANXIETY  CROHN'S DISEASE  Smoking  Debility  Venous stasis  Anticoagulant long-term use probable sleep apnea  Right and left heart function preserved. Dyspnea and edema multifactorial. Secondary to pneumonia, COPD exacerbation, fluid retention from cirrhosis and hypoalbuminemia and diltiazem. Continue antibiotics, bronchodilators, oxygen, IV Lasix. Stop Maxzide and instead place patient on spironolactone. No significant proteinuria on urinalysis.  Atrial fibrillation is chronic. Rate is slightly high, likely related to pulmonary issues, and  bronchodilators. Should improve once pulmonary issues stabilize. However, may need adjustment of diltiazem as blood pressure tolerates, or addition of digoxin.  We discussed home with physical therapy versus skilled nursing facility and she wishes to go home with PT.  Check labs in the morning.  Cancel calorie count. Intake is not a problem here. Not sure why it was ordered.   LOS: 2 days   Vang Kraeger L 02/11/2012, 11:09 AM

## 2012-02-11 NOTE — Evaluation (Signed)
Physical Therapy Evaluation Patient Details Name: [name] MRN: [id number] DOB: [birth year] Today's Date: 02/11/2012 Time: 1320-1400 PT Time Calculation (min): 40 min  PT Assessment / Plan / Recommendation Clinical Impression  Pt very motivated to improve strength to return home.  Gait training with RW 68' with min assistance with no LOB episodes through session.  Pt able to static stand for 5 minutes to brush teeth in restroom with no HHA and no LOB.  Pt did require min vc-ing for hand placement for safelty sit to stand and to stand within RW with gait.  No coughing or SOB episodes noted throughout session.    PT Assessment       Follow Up Recommendations       Barriers to Discharge        Equipment Recommendations       Recommendations for Other Services     Frequency      Precautions / Restrictions Precautions Precautions: Fall         Mobility  Bed Mobility Bed Mobility: Right Sidelying to Sit Right Sidelying to Sit: 4: Min assist Transfers Transfers: Sit to Stand;Stand to Sit Sit to Stand: 4: Min assist;With upper extremity assist Stand to Sit: 4: Min assist;Without upper extremity assist (min assist to control descent) Ambulation/Gait Ambulation/Gait Assistance: 4: Min assist Ambulation Distance (Feet): 42 Feet Assistive device: Rolling walker Gait Pattern: Step-through pattern;Trunk flexed;Decreased stride length Gait velocity: slow labored gait Stairs: No Wheelchair Mobility Wheelchair Mobility: No    Exercises     PT Diagnosis:    PT Problem List:   PT Treatment Interventions:     PT Goals Acute Rehab PT Goals PT Goal: Sit to Stand - Progress: Progressing toward goal PT Goal: Stand to Sit - Progress: Progressing toward goal PT Goal: Ambulate - Progress: Progressing toward goal  Visit Information  Last PT Received On: 02/11/12    Subjective Data  Subjective: I've coughed alot today, would love to sit up I'm tired of lying in bed.     Prior Functioning       Cognition  Overall Cognitive Status: Appears within functional limits for tasks assessed/performed Arousal/Alertness: Awake/alert Orientation Level: Appears intact for tasks assessed Behavior During Session: [facility] for tasks performed    Extremity/Trunk Assessment     Balance High Level Balance High Level Balance Activites: Side stepping High Level Balance Comments: safe gait mechanics with right stepping out of restroom  End of Session PT - End of Session Equipment Utilized During Treatment: Gait belt Activity Tolerance: Patient tolerated treatment well Patient left: in chair;with call bell/phone within reach;with chair alarm set Nurse Communication: Mobility status  GP     [name] 02/11/2012, 4:30 PM

## 2012-02-11 NOTE — Care Management Note (Signed)
    Page 1 of 2   02/15/2012     10:55:15 AM   CARE MANAGEMENT NOTE 02/15/2012  Patient:  [name], [name]   Account Number:  [id number]  Date Initiated:  02/11/2012  Documentation initiated by:  Rosemary Holms  Subjective/Objective Assessment:   Pt admitted with SOB/acute on chronic CHF. Spoke with pt and she wants to return home. Not going to a SNF     Action/Plan:   Pt choose [facility].   Anticipated DC Date:  02/15/2012   Anticipated DC Plan:  HOME W HOME HEALTH SERVICES      DC Planning Services  CM consult      Choice offered to / List presented to:     DME arranged  [facility]      DME agency  [facility]     [facility] arranged  HH-1 RN  HH-10 DISEASE MANAGEMENT  HH-2 PT  HH-4 NURSE'S AIDE  HH-6 SOCIAL WORKER      HH agency  [facility]   Status of service:  Completed, signed off Medicare Important Message given?   (If response is "NO", the following Medicare IM given date fields will be blank) Date Medicare IM given:   Date Additional Medicare IM given:    Discharge Disposition:  HOME W HOME HEALTH SERVICES  Per UR Regulation:    If discussed at Long Length of Stay Meetings, dates discussed:    Comments:  02/11/12 Rosemary Holms RN BSN CM

## 2012-02-11 NOTE — Progress Notes (Signed)
ANTICOAGULATION CONSULT NOTE  Pharmacy Consult for Coumadin   Indication: atrial fibrillation  Allergies  Allergen Reactions  . Sulfonamide Derivatives Other (See Comments)    Felt funny    Patient Measurements: Height: 4\' 11"  (149.9 cm) Weight: 245 lb 1.6 oz (111.177 kg) IBW/kg (Calculated) : 43.2   Vital Signs: Temp: 98.1 F (36.7 C) (09/12 0754) Temp src: Oral (09/12 0754) BP: 114/64 mmHg (09/12 0754) Pulse Rate: 115  (09/12 0754)  Labs:  Basename 02/11/12 0506 02/10/12 0438 02/09/12 1415  HGB -- -- 10.0*  HCT -- -- 33.4*  PLT -- -- 212  APTT -- -- --  LABPROT 28.1* 30.9* 29.2*  INR 2.58* 2.91* 2.71*  HEPARINUNFRC -- -- --  CREATININE -- 0.72 0.63  CKTOTAL -- -- --  CKMB -- -- --  TROPONINI -- -- <0.30    Estimated Creatinine Clearance: 68.6 ml/min (by C-G formula based on Cr of 0.72).   Medical History: Past Medical History  Diagnosis Date  . Hypertension     x 5 years  . Paroxysmal atrial fibrillation   . Anxiety   . Crohn's disease   . 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 No clear documentation of this  . Diabetes mellitus   . Neuromuscular disorder     periph neuropathy  . Shortness of breath   . Arthritis   . GERD (gastroesophageal reflux disease)   . Blood transfusion   . COPD (chronic obstructive pulmonary disease)   . Cirrhosis     non aloholic     Medications:  Scheduled:     . ALPRAZolam  0.25-0.5 mg Oral BID  . ALPRAZolam  0.5 mg Oral Once  . azithromycin  500 mg Oral Daily   Followed by  . azithromycin  250 mg Oral Daily  . cefTRIAXone (ROCEPHIN)  IV  1 g Intravenous Q24H  . diltiazem  180 mg Oral Daily  . ferrous sulfate  325 mg Oral Q breakfast  . furosemide  40 mg Intravenous BID  . guaiFENesin  600 mg Oral BID  . insulin aspart  0-15 Units Subcutaneous TID WC  . ipratropium  0.5 mg Nebulization QID  . levalbuterol  0.63 mg Nebulization QID  . mesalamine  800 mg Oral BID  . metFORMIN  1,000 mg  Oral BID WC  . methylPREDNISolone (SOLU-MEDROL) injection  125 mg Intravenous Once  . methylPREDNISolone sodium succinate      . multivitamin with minerals  1 tablet Oral Daily  . nystatin   Topical BID  . pantoprazole  40 mg Oral Daily  . potassium chloride  40 mEq Oral TID  . sodium chloride      . sodium chloride      . triamterene-hydrochlorothiazide  1 tablet Oral Daily  . Vitamin D (Ergocalciferol)  50,000 Units Oral Weekly  . warfarin  1 mg Oral Custom  . warfarin  1.5 mg Oral Custom  . Warfarin - Pharmacist Dosing Inpatient   Does not apply q1800  . DISCONTD: albuterol  2.5 mg Nebulization Q6H  . DISCONTD: furosemide  40 mg Intravenous Q12H  . DISCONTD: levalbuterol  0.63 mg Nebulization QID  . DISCONTD: potassium chloride  20 mEq Oral BID  . DISCONTD: sodium chloride  3 mL Intravenous Q12H  . DISCONTD: sodium chloride  3 mL Intravenous Q12H    Assessment: 76 yo F on chronic warfarin 1mg  daily except 1.5mg  on Mon, Fri for Afib. INR therapeutic on admission.  Remains in  goal range. No bleeding noted.   Goal of Therapy:  INR 2-3   Plan:  Continue home Coumadin regimen.  Daily INR for now  Elson Clan 02/11/2012,8:50 AM

## 2012-02-11 NOTE — Progress Notes (Signed)
Report given to Armanda Magic RN.

## 2012-02-11 NOTE — Progress Notes (Signed)
Patient told RN that she was feeling anxious, not able to sleep well. RN noticed that patient had refused 6pm dose of Xanax. Doctor was notified and new orders were given for a one time dose of xanax.

## 2012-02-11 NOTE — Progress Notes (Signed)
Prn nebulizer treatments was given due to patient insp/exp wheezes pt also has some crackles in left upper lobes rn notified sats 94% on 3lpm [place]

## 2012-02-11 NOTE — Progress Notes (Signed)
Patient was given Cardizem IV 10mg  at 0348. Patient's HR is still in the 120's, but patient was just given Xopenex at 0543. Doctor was notified and RN was told to continue monitoring for another hour to see if patient's HR would go down. Will continue to monitor and will report off to day nurse.

## 2012-02-11 NOTE — Progress Notes (Signed)
Brief Nutrition Note  Reviewed MD notes and noted that calorie count has been cancelled. Calorie count was initiated due to MD consult for calorie count upon admission.   Body mass index is 49.50 kg/(m^2). Pt meets criteria for extreme obesity, class III based on current BMI.   Current diet order is Heart healthy, patient is consuming approximately 75-100% of meals at this time. Labs and medications reviewed.   No further nutrition interventions warranted at this time. If nutrition issues arise, please consult RD.   Melody Haver, RD, LDN Pager: [phone]

## 2012-02-12 LAB — GLUCOSE, CAPILLARY
Glucose-Capillary: 163 mg/dL — ABNORMAL HIGH (ref 70–99)
Glucose-Capillary: 134 mg/dL — ABNORMAL HIGH (ref 70–99)

## 2012-02-12 LAB — BASIC METABOLIC PANEL
CO2: 39 mEq/L — ABNORMAL HIGH (ref 19–32)
GFR calc non Af Amer: 80 mL/min — ABNORMAL LOW (ref >90)
Glucose, Bld: 153 mg/dL — ABNORMAL HIGH (ref 70–99)
Potassium: 3.8 mEq/L (ref 3.5–5.1)
Sodium: 133 mEq/L — ABNORMAL LOW (ref 135–145)

## 2012-02-12 LAB — CBC
Hemoglobin: 9.6 g/dL — ABNORMAL LOW (ref 12.0–15.0)
MCH: 23.2 pg — ABNORMAL LOW (ref 26.0–34.0)
MCV: 81.1 fL (ref 78.0–100.0)
RBC: 4.13 MIL/uL (ref 3.87–5.11)
WBC: 9.5 10*3/uL (ref 4.0–10.5)

## 2012-02-12 LAB — PROTIME-INR: INR: 2.72 — ABNORMAL HIGH (ref 0.00–1.49)

## 2012-02-12 MED ORDER — DIGOXIN 250 MCG PO TABS
0.2500 mg | ORAL_TABLET | Freq: Every day | ORAL | Status: DC
  Administered 2012-02-12 – 2012-02-15 (×4): 0.25 mg via ORAL
  Filled 2012-02-12 (×4): qty 1

## 2012-02-12 MED ORDER — PREDNISONE 20 MG PO TABS
60.0000 mg | ORAL_TABLET | Freq: Every day | ORAL | Status: DC
  Administered 2012-02-12 – 2012-02-13 (×2): 60 mg via ORAL
  Filled 2012-02-12 (×2): qty 3

## 2012-02-12 NOTE — Progress Notes (Signed)
ANTICOAGULATION CONSULT NOTE  Pharmacy Consult for Coumadin   Indication: atrial fibrillation  Allergies  Allergen Reactions  . Sulfonamide Derivatives Other (See Comments)    Felt funny   Patient Measurements: Height: 4\' 11"  (149.9 cm) Weight: 240 lb 9.6 oz (109.135 kg) IBW/kg (Calculated) : 43.2   Vital Signs: Temp: 97.2 F (36.2 C) (09/13 0551) Temp src: Oral (09/13 0551) BP: 104/66 mmHg (09/13 0551) Pulse Rate: 111  (09/13 0551)  Labs:  Basename 02/12/12 0503 02/11/12 0506 02/10/12 0438 02/09/12 1415  HGB 9.6* -- -- 10.0*  HCT 33.5* -- -- 33.4*  PLT 195 -- -- 212  APTT -- -- -- --  LABPROT 29.3* 28.1* 30.9* --  INR 2.72* 2.58* 2.91* --  HEPARINUNFRC -- -- -- --  CREATININE 0.79 -- 0.72 0.63  CKTOTAL -- -- -- --  CKMB -- -- -- --  TROPONINI -- -- -- <0.30    Estimated Creatinine Clearance: 67.8 ml/min (by C-G formula based on Cr of 0.79).  Medical History: Past Medical History  Diagnosis Date  . Hypertension     x 5 years  . Paroxysmal atrial fibrillation   . Anxiety   . Crohn's disease   . 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 No clear documentation of this  . Diabetes mellitus   . Neuromuscular disorder     periph neuropathy  . Shortness of breath   . Arthritis   . GERD (gastroesophageal reflux disease)   . Blood transfusion   . COPD (chronic obstructive pulmonary disease)   . Cirrhosis     non aloholic    Medications:  Scheduled:     . ALPRAZolam  0.25-0.5 mg Oral BID  . azithromycin  250 mg Oral Daily  . cefTRIAXone (ROCEPHIN)  IV  1 g Intravenous Q24H  . diltiazem  180 mg Oral Daily  . ferrous sulfate  325 mg Oral Q breakfast  . furosemide  40 mg Intravenous BID  . guaiFENesin  600 mg Oral BID  . insulin aspart  0-15 Units Subcutaneous TID WC  . ipratropium  0.5 mg Nebulization QID  . levalbuterol  0.63 mg Nebulization QID  . mesalamine  800 mg Oral BID  . metFORMIN  1,000 mg Oral BID WC  . multivitamin with  minerals  1 tablet Oral Daily  . nystatin   Topical BID  . pantoprazole  40 mg Oral Daily  . potassium chloride  40 mEq Oral Daily  . spironolactone  50 mg Oral BID  . Vitamin D (Ergocalciferol)  50,000 Units Oral Weekly  . warfarin  1 mg Oral Custom  . warfarin  1.5 mg Oral Custom  . Warfarin - Pharmacist Dosing Inpatient   Does not apply q1800  . DISCONTD: potassium chloride  40 mEq Oral TID  . DISCONTD: triamterene-hydrochlorothiazide  1 tablet Oral Daily   Assessment: 76 yo F on chronic warfarin 1mg  daily except 1.5mg  on Mon, Fri for Afib. INR therapeutic on admission.  Remains in goal range. No bleeding noted.   Goal of Therapy:  INR 2-3   Plan:  Continue home Coumadin regimen.  Check INR on Mon-Wed-Fri  Valrie Hart A 02/12/2012,9:59 AM

## 2012-02-12 NOTE — Progress Notes (Signed)
Physical Therapy Treatment Patient Details Name: [name] MRN: [id number] DOB: 04-15-1936 Today's Date: 02/12/2012 Time: 0900-0920 PT Time Calculation (min): 20 min  PT Assessment / Plan / Recommendation Comments on Treatment Session  Improving gait mechanics icreased gait distance and velocity with min guard.  Pt able to ambulate 21' with RW.  No coughing episodes though session.  O2 sat 94% following gait training.  Pt left in chair with call bell within reach and chair alarm set.      Follow Up Recommendations       Barriers to Discharge        Equipment Recommendations       Recommendations for Other Services    Frequency     Plan  (stair training next session)    Precautions / Restrictions Precautions Precautions: Fall Restrictions Weight Bearing Restrictions: No       Mobility  Bed Mobility Bed Mobility: Left Sidelying to Sit Left Sidelying to Sit: 5: Supervision (vc-ing for hand placement) Transfers Transfers: Sit to Stand;Stand to Sit Sit to Stand: With upper extremity assist;4: Min guard Stand to Sit: 4: Min guard;With upper extremity assist Ambulation/Gait Ambulation/Gait Assistance: 4: Min guard Ambulation Distance (Feet): 68 Feet Assistive device: Rolling walker Ambulation/Gait Assistance Details: increased gait velocity, vc-ing for posture and to stand within walker Gait Pattern: Step-through pattern;Decreased stride length;Trunk flexed Gait velocity: increased gait velocity Wheelchair Mobility Wheelchair Mobility: No    Exercises Total Joint Exercises Ankle Circles/Pumps: AROM;Both;10 reps Quad Sets: AROM;Both;10 reps Long Arc Quad: AROM;Both;10 reps   PT Diagnosis:    PT Problem List:   PT Treatment Interventions:     PT Goals Acute Rehab PT Goals PT Goal: Sit to Stand - Progress: Met PT Goal: Stand to Sit - Progress: Met PT Goal: Ambulate - Progress: Met PT Goal: Up/Down Stairs - Progress: Not met  Visit Information  Last PT  Received On: 02/12/12    Subjective Data  Subjective: I felt great sitting up yesterday, had one episode of coughing but my legs feel better, feeling stronger.   Cognition  Overall Cognitive Status: Appears within functional limits for tasks assessed/performed Arousal/Alertness: Awake/alert Orientation Level: Appears intact for tasks assessed Behavior During Session: [facility] for tasks performed    Balance     End of Session PT - End of Session Equipment Utilized During Treatment: Gait belt Activity Tolerance: Patient tolerated treatment well Patient left: in chair;with call bell/phone within reach;with chair alarm set Nurse Communication: Mobility status   GP     [name] 02/12/2012, 9:27 AM

## 2012-02-12 NOTE — Progress Notes (Signed)
Subjective: Still with cough and congestion, wheeze.  Abdominal wall and leg edema improving.  Walked today with PT.  Objective: Vital signs in last 24 hours: Filed Vitals:   02/12/12 0742 02/12/12 0920 02/12/12 1021 02/12/12 1135  BP:      Pulse:   134   Temp:      TempSrc:      Resp:      Height:      Weight:      SpO2: 95% 94%  93%   Weight change: -2.041 kg (-4 lb 8 oz)  Intake/Output Summary (Last 24 hours) at 02/12/12 1219 Last data filed at 02/12/12 0556  Gross per 24 hour  Intake    640 ml  Output   2850 ml  Net  -2210 ml   Tele:  A fib. Rate 120  General: Appears short of breath with talking. Speaks in truncated sentences. Morbidly obese.  Lungs: Bilateral rhonchi and congestion. Wheeze present. Prolonged expiratory phase. No rales. Cardiovascular: Irregularly irregular without murmurs gallops rubs Abdomen obese slightly less edema Extremities brawny edema with chronic venous stasis changes. Slightly less edema in the thighs.  Lab Results: Basic Metabolic Panel:  Lab 02/12/12 1914 02/10/12 0438 02/09/12 1415  NA 133* 140 --  K 3.8 3.1* --  CL 89* 98 --  CO2 39* 38* --  GLUCOSE 153* 104* --  BUN 11 6 --  CREATININE 0.79 0.72 --  CALCIUM 8.5 8.4 --  MG -- -- 1.5  PHOS -- -- --   Liver Function Tests:  Lab 02/09/12 1415  AST 27  ALT 14  ALKPHOS 112  BILITOT 0.6  PROT 6.6  ALBUMIN 2.5*   No results found for this basename: LIPASE:2,AMYLASE:2 in the last 168 hours No results found for this basename: AMMONIA:2 in the last 168 hours CBC:  Lab 02/12/12 0503 02/09/12 1415  WBC 9.5 5.0  NEUTROABS -- 3.1  HGB 9.6* 10.0*  HCT 33.5* 33.4*  MCV 81.1 78.0  PLT 195 212   Cardiac Enzymes:  Lab 02/09/12 1415  CKTOTAL --  CKMB --  CKMBINDEX --  TROPONINI <0.30   BNP:  Lab 02/09/12 1415  PROBNP 475.2*   D-Dimer: No results found for this basename: DDIMER:2 in the last 168 hours CBG:  Lab 02/12/12 1136 02/12/12 0750 02/11/12 2138 02/11/12  1656 02/11/12 1119 02/11/12 0756  GLUCAP 163* 120* 246* 275* 169* 121*   Hemoglobin A1C:  Lab 02/10/12 0438  HGBA1C 6.1*   Fasting Lipid Panel: No results found for this basename: CHOL,HDL,LDLCALC,TRIG,CHOLHDL,LDLDIRECT in the last 782 hours Thyroid Function Tests:  Lab 02/09/12 1415  TSH 2.355  T4TOTAL --  FREET4 --  T3FREE --  THYROIDAB --   Coagulation:  Lab 02/12/12 0503 02/11/12 0506 02/10/12 0438 02/09/12 1415  LABPROT 29.3* 28.1* 30.9* 29.2*  INR 2.72* 2.58* 2.91* 2.71*   Anemia Panel: No results found for this basename: VITAMINB12,FOLATE,FERRITIN,TIBC,IRON,RETICCTPCT in the last 168 hours Urine Drug Screen: Drugs of Abuse  No results found for this basename: labopia,  cocainscrnur,  labbenz,  amphetmu,  thcu,  labbarb    Alcohol Level: No results found for this basename: ETH:2 in the last 168 hours Urinalysis:  Lab 02/10/12 2048  COLORURINE STRAW*  LABSPEC 1.010  PHURINE 6.0  GLUCOSEU NEGATIVE  HGBUR MODERATE*  BILIRUBINUR NEGATIVE  KETONESUR NEGATIVE  PROTEINUR NEGATIVE  UROBILINOGEN 0.2  NITRITE NEGATIVE  LEUKOCYTESUR TRACE*    Micro Results: Recent Results (from the past 240 hour(s))  MRSA PCR SCREENING  Status: Normal   Collection Time   02/09/12  7:35 PM      Component Value Range Status Comment   MRSA by PCR NEGATIVE  NEGATIVE Final    Studies/Results: No results found. Scheduled Meds:    . ALPRAZolam  0.25-0.5 mg Oral BID  . azithromycin  250 mg Oral Daily  . cefTRIAXone (ROCEPHIN)  IV  1 g Intravenous Q24H  . diltiazem  180 mg Oral Daily  . ferrous sulfate  325 mg Oral Q breakfast  . furosemide  40 mg Intravenous BID  . guaiFENesin  600 mg Oral BID  . insulin aspart  0-15 Units Subcutaneous TID WC  . ipratropium  0.5 mg Nebulization QID  . levalbuterol  0.63 mg Nebulization QID  . mesalamine  800 mg Oral BID  . metFORMIN  1,000 mg Oral BID WC  . multivitamin with minerals  1 tablet Oral Daily  . nystatin   Topical BID  .  pantoprazole  40 mg Oral Daily  . potassium chloride  40 mEq Oral Daily  . spironolactone  50 mg Oral BID  . Vitamin D (Ergocalciferol)  50,000 Units Oral Weekly  . warfarin  1 mg Oral Custom  . warfarin  1.5 mg Oral Custom  . Warfarin - Pharmacist Dosing Inpatient   Does not apply q1800   Continuous Infusions:  PRN Meds:.acetaminophen, diltiazem, levalbuterol Assessment/Plan: Active Problems:  Edema  CAP (community acquired pneumonia)  Dyspnea  COPD exacerbation  DM  HYPERTENSION, UNSPECIFIED  Atrial fibrillation  Cirrhosis, nonalcoholic  Hypokalemia resolved.  Hypoalbuminemia  OVERWEIGHT/OBESITY  ANXIETY  CROHN'S DISEASE  Smoking  Debility  Venous stasis  Anticoagulant long-term use probable sleep apnea  Right and left heart function preserved. Dyspnea and edema multifactorial. Secondary to pneumonia, COPD exacerbation, fluid retention from cirrhosis and hypoalbuminemia and diltiazem. Continue antibiotics, bronchodilators, oxygen, IV Lasix. Stop Maxzide and instead place patient on spironolactone. No significant proteinuria on urinalysis. Slow to improve.  Add prednisone.  Tachycardia likely in part due to pulmonary issues, but will add digoxin (no load).  Needs outpt polysomnogram.  Home PT, RN, aide arranged.   LOS: 3 days   Freman Lapage L 02/12/2012, 12:19 PM

## 2012-02-13 ENCOUNTER — Encounter (HOSPITAL_COMMUNITY): Payer: Self-pay | Admitting: Internal Medicine

## 2012-02-13 DIAGNOSIS — D649 Anemia, unspecified: Secondary | ICD-10-CM

## 2012-02-13 DIAGNOSIS — R0902 Hypoxemia: Secondary | ICD-10-CM | POA: Diagnosis present

## 2012-02-13 DIAGNOSIS — J9602 Acute respiratory failure with hypercapnia: Secondary | ICD-10-CM | POA: Diagnosis present

## 2012-02-13 DIAGNOSIS — J90 Pleural effusion, not elsewhere classified: Secondary | ICD-10-CM

## 2012-02-13 DIAGNOSIS — I517 Cardiomegaly: Secondary | ICD-10-CM

## 2012-02-13 DIAGNOSIS — J9600 Acute respiratory failure, unspecified whether with hypoxia or hypercapnia: Secondary | ICD-10-CM

## 2012-02-13 HISTORY — DX: Cardiomegaly: I51.7

## 2012-02-13 HISTORY — DX: Pleural effusion, not elsewhere classified: J90

## 2012-02-13 HISTORY — DX: Anemia, unspecified: D64.9

## 2012-02-13 LAB — BASIC METABOLIC PANEL
BUN: 14 mg/dL (ref 6–23)
CO2: 43 mEq/L (ref 19–32)
GFR calc non Af Amer: 81 mL/min — ABNORMAL LOW (ref >90)
Glucose, Bld: 119 mg/dL — ABNORMAL HIGH (ref 70–99)
Potassium: 4.4 mEq/L (ref 3.5–5.1)
Sodium: 133 mEq/L — ABNORMAL LOW (ref 135–145)

## 2012-02-13 LAB — GLUCOSE, CAPILLARY
Glucose-Capillary: 111 mg/dL — ABNORMAL HIGH (ref 70–99)
Glucose-Capillary: 159 mg/dL — ABNORMAL HIGH (ref 70–99)
Glucose-Capillary: 171 mg/dL — ABNORMAL HIGH (ref 70–99)

## 2012-02-13 LAB — BLOOD GAS, ARTERIAL
Acid-Base Excess: 18.4 mmol/L — ABNORMAL HIGH (ref 0.0–2.0)
Bicarbonate: 44.3 mEq/L — ABNORMAL HIGH (ref 20.0–24.0)
TCO2: 41.3 mmol/L (ref 0–100)
pCO2 arterial: 72.4 mmHg (ref 35.0–45.0)
pH, Arterial: 7.403 (ref 7.350–7.450)
pO2, Arterial: 57.4 mmHg — ABNORMAL LOW (ref 80.0–100.0)

## 2012-02-13 LAB — IRON AND TIBC: TIBC: 366 ug/dL (ref 250–470)

## 2012-02-13 MED ORDER — INSULIN GLARGINE 100 UNIT/ML ~~LOC~~ SOLN
30.0000 [IU] | Freq: Every day | SUBCUTANEOUS | Status: DC
  Administered 2012-02-13 – 2012-02-14 (×2): 30 [IU] via SUBCUTANEOUS

## 2012-02-13 MED ORDER — METHYLPREDNISOLONE SODIUM SUCC 125 MG IJ SOLR
80.0000 mg | Freq: Three times a day (TID) | INTRAMUSCULAR | Status: DC
  Administered 2012-02-13 – 2012-02-15 (×6): 80 mg via INTRAVENOUS
  Filled 2012-02-13 (×6): qty 2

## 2012-02-13 MED ORDER — IPRATROPIUM BROMIDE 0.02 % IN SOLN
0.5000 mg | RESPIRATORY_TRACT | Status: DC
  Administered 2012-02-13 – 2012-02-15 (×8): 0.5 mg via RESPIRATORY_TRACT
  Filled 2012-02-13 (×10): qty 2.5

## 2012-02-13 MED ORDER — LEVALBUTEROL HCL 0.63 MG/3ML IN NEBU
0.6300 mg | INHALATION_SOLUTION | RESPIRATORY_TRACT | Status: DC
  Administered 2012-02-13 – 2012-02-15 (×8): 0.63 mg via RESPIRATORY_TRACT
  Filled 2012-02-13 (×9): qty 3

## 2012-02-13 NOTE — Progress Notes (Signed)
CRITICAL VALUE ALERT  Critical value received:  PH 7.403 pCO 72.4 bicarb 44.3 PO2 57.4  Date of notification:  02-13-12  Time of notification:  0802  Critical value read back: yes  Nurse who received alert: j Angelise Petrich rn MD notified (1st page):  fisher  Time of first page:  0803  MD notified (2nd page):  Time of second page:  Responding MD:  fisher  Time MD responded:

## 2012-02-13 NOTE — Progress Notes (Signed)
Subjective: The patient says that she still has quite a bit of chest congestion. She is unable to expectorate any sputum. She feels that she has less body edema.  Objective: Vital signs in last 24 hours: Filed Vitals:   02/12/12 2053 02/13/12 0450 02/13/12 0510 02/13/12 1124  BP: 107/69  102/67   Pulse: 118  107   Temp: 98 F (36.7 C)  97.6 F (36.4 C)   TempSrc: Oral  Oral   Resp: 20  18   Height:      Weight:  109.1 kg (240 lb 8.4 oz)    SpO2: 94%  93% 91%    Intake/Output Summary (Last 24 hours) at 02/13/12 1317 Last data filed at 02/13/12 1100  Gross per 24 hour  Intake    240 ml  Output   4675 ml  Net  -4435 ml    Weight change: -0.035 kg (-1.3 oz)  Physical exam: General: Pleasant obese 76 year old Caucasian woman sitting up in bed, in no acute distress. Lungs: Diffuse bilateral expiratory wheezes. Heart: Irregular, irregular, with tachycardia. Abdomen: Morbidly obese, positive bowel sounds, mild ascites, nontender. Extremities: Severe brawny chronic venous stasis changes with 1+ to 2+ globally nonpitting edema. Neurologic: She is alert and oriented x3. Cranial nerves II through XII are intact.  Lab Results: Basic Metabolic Panel:  Basename 02/13/12 0623 02/12/12 0503  NA 133* 133*  K 4.4 3.8  CL 88* 89*  CO2 43* 39*  GLUCOSE 119* 153*  BUN 14 11  CREATININE 0.76 0.79  CALCIUM 8.6 8.5  MG -- --  PHOS -- --   Liver Function Tests: No results found for this basename: AST:2,ALT:2,ALKPHOS:2,BILITOT:2,PROT:2,ALBUMIN:2 in the last 72 hours No results found for this basename: LIPASE:2,AMYLASE:2 in the last 72 hours No results found for this basename: AMMONIA:2 in the last 72 hours CBC:  Basename 02/12/12 0503  WBC 9.5  NEUTROABS --  HGB 9.6*  HCT 33.5*  MCV 81.1  PLT 195   Cardiac Enzymes: No results found for this basename: CKTOTAL:3,CKMB:3,CKMBINDEX:3,TROPONINI:3 in the last 72 hours BNP: No results found for this basename: PROBNP:3 in the last 72  hours D-Dimer: No results found for this basename: DDIMER:2 in the last 72 hours CBG:  Basename 02/13/12 1136 02/13/12 0737 02/12/12 2027 02/12/12 1616 02/12/12 1136 02/12/12 0750  GLUCAP 159* 111* 180* 134* 163* 120*   Hemoglobin A1C: No results found for this basename: HGBA1C in the last 72 hours Fasting Lipid Panel: No results found for this basename: CHOL,HDL,LDLCALC,TRIG,CHOLHDL,LDLDIRECT in the last 72 hours Thyroid Function Tests: No results found for this basename: TSH,T4TOTAL,FREET4,T3FREE,THYROIDAB in the last 72 hours Anemia Panel: No results found for this basename: VITAMINB12,FOLATE,FERRITIN,TIBC,IRON,RETICCTPCT in the last 72 hours Coagulation:  Basename 02/12/12 0503 02/11/12 0506  LABPROT 29.3* 28.1*  INR 2.72* 2.58*   Urine Drug Screen: Drugs of Abuse  No results found for this basename: labopia,  cocainscrnur,  labbenz,  amphetmu,  thcu,  labbarb    Alcohol Level: No results found for this basename: ETH:2 in the last 72 hours Urinalysis:  Basename 02/10/12 2048  COLORURINE STRAW*  LABSPEC 1.010  PHURINE 6.0  GLUCOSEU NEGATIVE  HGBUR MODERATE*  BILIRUBINUR NEGATIVE  KETONESUR NEGATIVE  PROTEINUR NEGATIVE  UROBILINOGEN 0.2  NITRITE NEGATIVE  LEUKOCYTESUR TRACE*   Misc. Labs:   Micro: Recent Results (from the past 240 hour(s))  MRSA PCR SCREENING     Status: Normal   Collection Time   02/09/12  7:35 PM      Component Value  Range Status Comment   MRSA by PCR NEGATIVE  NEGATIVE Final     Studies/Results:  2-D echocardiogram:Study Conclusions  - Left ventricle: The cavity size was normal. Wall thickness was normal. Systolic function was normal. The estimated ejection fraction was in the range of 60% to 65%. Wall motion was normal; there were no regional wall motion abnormalities. - Mitral valve: Calcified annulus. - Left atrium: The atrium was moderately dilated. - Right ventricle: The cavity size was normal. Wall thickness was moderately  increased. - Right atrium: The atrium was mildly to moderately dilated. - Pericardium, extracardiac: A trivial posterior pericardial effusion was identified. There was a moderate-sized left pleural effusion. Impressions:  - Compared to the prior study performed 03/04/11, interval enlargement in left and right atria; left pleural effusion and minimal pericardial effusion now present. Transthoracic echocardiography. M-mode, complete 2D, spectral Doppler, and color Doppler. Height: Height: 149.9cm. Height: 59in. Weight: Weight: 112.5kg. Weight: 247.5lb. Body mass index: BMI: 50.1kg/m^2. Body surface area: BSA: 2.45m^2. Patient status: Inpatient. Location: Bedside.     Medications:  Scheduled:   . ALPRAZolam  0.25-0.5 mg Oral BID  . azithromycin  250 mg Oral Daily  . cefTRIAXone (ROCEPHIN)  IV  1 g Intravenous Q24H  . digoxin  0.25 mg Oral Daily  . diltiazem  180 mg Oral Daily  . ferrous sulfate  325 mg Oral Q breakfast  . furosemide  40 mg Intravenous BID  . guaiFENesin  600 mg Oral BID  . insulin aspart  0-15 Units Subcutaneous TID WC  . ipratropium  0.5 mg Nebulization QID  . levalbuterol  0.63 mg Nebulization QID  . mesalamine  800 mg Oral BID  . metFORMIN  1,000 mg Oral BID WC  . multivitamin with minerals  1 tablet Oral Daily  . nystatin   Topical BID  . pantoprazole  40 mg Oral Daily  . potassium chloride  40 mEq Oral Daily  . predniSONE  60 mg Oral Q breakfast  . spironolactone  50 mg Oral BID  . Vitamin D (Ergocalciferol)  50,000 Units Oral Weekly  . warfarin  1 mg Oral Custom  . warfarin  1.5 mg Oral Custom  . Warfarin - Pharmacist Dosing Inpatient   Does not apply q1800   Continuous:  [id number], diltiazem, levalbuterol  Assessment: Principal Problem:  *Acute respiratory failure with hypercapnia Active Problems:  DM  OVERWEIGHT/OBESITY  ANXIETY  HYPERTENSION, UNSPECIFIED  Atrial fibrillation  CROHN'S DISEASE  Edema  Smoking  Cirrhosis,  nonalcoholic  Debility  Hypokalemia  CAP (community acquired pneumonia)  Dyspnea  Venous stasis  COPD exacerbation  Hypoalbuminemia  Anticoagulant long-term use  Pleural effusion  RVH (right ventricular hypertrophy)  Anemia  Hypoxia     1. Acute and probably chronic respiratory failure with hypercapnia and hypoxia. An ABG was ordered because her bicarbonate level was steadily increasing. Her pH is approximately within normal limits at 7.4, but her PCO2 was elevated at 72. I suspect, she has an element of chronic hypercapnia from COPD. She is not on oxygen chronically at home. We'll continue oxygen supplementation here and we'll reassess her for need of home oxygen.  COPD with exacerbation and concomitant tobacco abuse. The patient was strongly advised to stop smoking. We'll continue bronchodilators, but will increase the frequency. I believe she needs a higher dose of steroids.  Community-acquired pneumonia. We'll continue Rocephin and azithromycin.  Chronic atrial fibrillation with mild rapid ventricular response. Elevated heart rate is likely secondary to pulmonary process and bronchodilators. Digoxin  was added. Diltiazem will be continued, but the dose may need to be increased. We'll continue anticoagulation with Coumadin. TSH is within normal limits.   Right ventricular hypertrophy, per 2-D echocardiogram. Ejection fraction was within normal limits. ProBNP was modestly elevated at less than 500.   Global body edema and pleural effusion in the setting of chronic cirrhosis. We'll continue IV Lasix for another day or 2 and then transitioned to oral. Aldactone was added. She is diuresed over 11 L since hospital admission.   Type 2 diabetes mellitus. Hemoglobin A1c 6.1. Capillary blood glucose generally controlled.  Anemia. Will assess further.  Plan: 1. We'll discontinue prednisone in favor of IV Solu-Medrol. Increase frequency of Xopenex and Atrovent nebulizers. 2. We'll add  Lantus in anticipation that her blood sugars will increase on Solu-Medrol. 3. Tobacco cessation counseling. 4. Watch for increasing CO2 levels. 5. We'll order stool guaiacs and check an anemia panel. 6. We'll consider going up on the diltiazem dosing but will continue to monitor heart rates.     LOS: 4 days   [name] 02/13/2012, 1:17 PM

## 2012-02-13 NOTE — Progress Notes (Addendum)
CRITICAL VALUE ALERT  Critical value received: CO2 43  Date of notification:  02/13/12  Time of notification:  0735  Critical value read back:yes  Nurse who received alert:j Mally Gavina  MD notified (1st page): fisher  Time of first page:  [phone]  MD notified (2nd page):  Time of second page:  Responding MD:  fisher  Time MD responded:  [phone] (orders given)

## 2012-02-14 LAB — BASIC METABOLIC PANEL
BUN: 17 mg/dL (ref 6–23)
CO2: 40 mEq/L (ref 19–32)
Chloride: 87 mEq/L — ABNORMAL LOW (ref 96–112)
GFR calc non Af Amer: 84 mL/min — ABNORMAL LOW (ref >90)
Glucose, Bld: 135 mg/dL — ABNORMAL HIGH (ref 70–99)
Potassium: 4 mEq/L (ref 3.5–5.1)
Sodium: 132 mEq/L — ABNORMAL LOW (ref 135–145)

## 2012-02-14 LAB — CBC
Hemoglobin: 10.6 g/dL — ABNORMAL LOW (ref 12.0–15.0)
MCH: 23.5 pg — ABNORMAL LOW (ref 26.0–34.0)
MCHC: 29 g/dL — ABNORMAL LOW (ref 30.0–36.0)
RDW: 19.1 % — ABNORMAL HIGH (ref 11.5–15.5)

## 2012-02-14 LAB — GLUCOSE, CAPILLARY
Glucose-Capillary: 168 mg/dL — ABNORMAL HIGH (ref 70–99)
Glucose-Capillary: 180 mg/dL — ABNORMAL HIGH (ref 70–99)

## 2012-02-14 LAB — FOLATE: Folate: 15.6 ng/mL

## 2012-02-14 LAB — RETICULOCYTES
Retic Count, Absolute: 103.7 10*3/uL (ref 19.0–186.0)
Retic Ct Pct: 2.3 % (ref 0.4–3.1)

## 2012-02-14 LAB — FERRITIN: Ferritin: 12 ng/mL (ref 10–291)

## 2012-02-14 MED ORDER — SODIUM CHLORIDE 0.9 % IJ SOLN
INTRAMUSCULAR | Status: AC
  Administered 2012-02-14: 3 mL
  Filled 2012-02-14: qty 3

## 2012-02-14 MED ORDER — BIOTENE DRY MOUTH MT LIQD
15.0000 mL | Freq: Two times a day (BID) | OROMUCOSAL | Status: DC
  Administered 2012-02-14 – 2012-02-15 (×3): 15 mL via OROMUCOSAL

## 2012-02-14 NOTE — Progress Notes (Addendum)
Subjective: The patient says that she has less chest congestion than yesterday. She is now able to expectorate a small amount of tan or off colored sputum.  Objective: Vital signs in last 24 hours: Filed Vitals:   02/13/12 1919 02/13/12 2047 02/14/12 0514 02/14/12 0705  BP:  110/68 117/66   Pulse:  102 94   Temp:  97.9 F (36.6 C) 97.7 F (36.5 C)   TempSrc:  Oral Oral   Resp:  20 20   Height:      Weight:   109.045 kg (240 lb 6.4 oz)   SpO2: 94% 95% 97% 97%    Intake/Output Summary (Last 24 hours) at 02/14/12 1116 Last data filed at 02/14/12 0600  Gross per 24 hour  Intake   1263 ml  Output   2375 ml  Net  -1112 ml    Weight change: -0.055 kg (-2 oz)  Physical exam: General: Pleasant obese 76 year old Caucasian woman sitting up in bed, in no acute distress. Lungs: Significantly fewer bilateral expiratory wheezes than yesterday. Heart: Irregular, irregular, with borderline tachycardia. Abdomen: Morbidly obese, positive bowel sounds, mild ascites, nontender. Extremities: Severe brawny chronic venous stasis changes with 1+ globally nonpitting edema. Neurologic: She is alert and oriented x3. Cranial nerves II through XII are intact.  Lab Results: Basic Metabolic Panel:  Basename 02/14/12 0556 02/13/12 0623  NA 132* 133*  K 4.0 4.4  CL 87* 88*  CO2 40* 43*  GLUCOSE 135* 119*  BUN 17 14  CREATININE 0.68 0.76  CALCIUM 8.8 8.6  MG -- --  PHOS -- --   Liver Function Tests: No results found for this basename: AST:2,ALT:2,ALKPHOS:2,BILITOT:2,PROT:2,ALBUMIN:2 in the last 72 hours No results found for this basename: LIPASE:2,AMYLASE:2 in the last 72 hours No results found for this basename: AMMONIA:2 in the last 72 hours CBC:  Basename 02/14/12 0556 02/12/12 0503  WBC 8.9 9.5  NEUTROABS -- --  HGB 10.6* 9.6*  HCT 36.6 33.5*  MCV 81.2 81.1  PLT 205 195   Cardiac Enzymes: No results found for this basename: CKTOTAL:3,CKMB:3,CKMBINDEX:3,TROPONINI:3 in the last 72  hours BNP: No results found for this basename: PROBNP:3 in the last 72 hours D-Dimer: No results found for this basename: DDIMER:2 in the last 72 hours CBG:  Basename 02/14/12 0802 02/13/12 2045 02/13/12 1719 02/13/12 1136 02/13/12 0737 02/12/12 2027  GLUCAP 136* 218* 171* 159* 111* 180*   Hemoglobin A1C: No results found for this basename: HGBA1C in the last 72 hours Fasting Lipid Panel: No results found for this basename: CHOL,HDL,LDLCALC,TRIG,CHOLHDL,LDLDIRECT in the last 72 hours Thyroid Function Tests: No results found for this basename: TSH,T4TOTAL,FREET4,T3FREE,THYROIDAB in the last 72 hours Anemia Panel:  Basename 02/14/12 0556 02/13/12 1525  VITAMINB12 -- --  FOLATE -- --  FERRITIN -- --  TIBC -- 366  IRON -- 33*  RETICCTPCT 2.3 --   Coagulation:  Basename 02/12/12 0503  LABPROT 29.3*  INR 2.72*   Urine Drug Screen: Drugs of Abuse  No results found for this basename: labopia,  cocainscrnur,  labbenz,  amphetmu,  thcu,  labbarb    Alcohol Level: No results found for this basename: ETH:2 in the last 72 hours Urinalysis: No results found for this basename: COLORURINE:2,APPERANCEUR:2,LABSPEC:2,PHURINE:2,GLUCOSEU:2,HGBUR:2,BILIRUBINUR:2,KETONESUR:2,PROTEINUR:2,UROBILINOGEN:2,NITRITE:2,LEUKOCYTESUR:2 in the last 72 hours Misc. Labs:   Micro: Recent Results (from the past 240 hour(s))  MRSA PCR SCREENING     Status: Normal   Collection Time   02/09/12  7:35 PM      Component Value Range Status Comment  MRSA by PCR NEGATIVE  NEGATIVE Final     Studies/Results:  2-D echocardiogram:Study Conclusions  - Left ventricle: The cavity size was normal. Wall thickness was normal. Systolic function was normal. The estimated ejection fraction was in the range of 60% to 65%. Wall motion was normal; there were no regional wall motion abnormalities. - Mitral valve: Calcified annulus. - Left atrium: The atrium was moderately dilated. - Right ventricle: The cavity size  was normal. Wall thickness was moderately increased. - Right atrium: The atrium was mildly to moderately dilated. - Pericardium, extracardiac: A trivial posterior pericardial effusion was identified. There was a moderate-sized left pleural effusion. Impressions:  - Compared to the prior study performed 03/04/11, interval enlargement in left and right atria; left pleural effusion and minimal pericardial effusion now present. Transthoracic echocardiography. M-mode, complete 2D, spectral Doppler, and color Doppler. Height: Height: 149.9cm. Height: 59in. Weight: Weight: 112.5kg. Weight: 247.5lb. Body mass index: BMI: 50.1kg/m^2. Body surface area: BSA: 2.73m^2. Patient status: Inpatient. Location: Bedside.     Medications:  Scheduled:    . ALPRAZolam  0.25-0.5 mg Oral BID  . antiseptic oral rinse  15 mL Mouth Rinse BID  . azithromycin  250 mg Oral Daily  . cefTRIAXone (ROCEPHIN)  IV  1 g Intravenous Q24H  . digoxin  0.25 mg Oral Daily  . diltiazem  180 mg Oral Daily  . ferrous sulfate  325 mg Oral Q breakfast  . furosemide  40 mg Intravenous BID  . guaiFENesin  600 mg Oral BID  . insulin aspart  0-15 Units Subcutaneous TID WC  . insulin glargine  30 Units Subcutaneous QHS  . ipratropium  0.5 mg Nebulization Q4H  . levalbuterol  0.63 mg Nebulization Q4H  . mesalamine  800 mg Oral BID  . metFORMIN  1,000 mg Oral BID WC  . methylPREDNISolone (SOLU-MEDROL) injection  80 mg Intravenous Q8H  . multivitamin with minerals  1 tablet Oral Daily  . nystatin   Topical BID  . pantoprazole  40 mg Oral Daily  . potassium chloride  40 mEq Oral Daily  . sodium chloride      . spironolactone  50 mg Oral BID  . Vitamin D (Ergocalciferol)  50,000 Units Oral Weekly  . warfarin  1 mg Oral Custom  . warfarin  1.5 mg Oral Custom  . Warfarin - Pharmacist Dosing Inpatient   Does not apply q1800  . DISCONTD: ipratropium  0.5 mg Nebulization QID  . DISCONTD: levalbuterol  0.63 mg Nebulization QID    . DISCONTD: predniSONE  60 mg Oral Q breakfast   Continuous:  [id number], diltiazem, levalbuterol  Assessment: Principal Problem:  *Acute respiratory failure with hypercapnia Active Problems:  DM  OVERWEIGHT/OBESITY  ANXIETY  HYPERTENSION, UNSPECIFIED  Atrial fibrillation  CROHN'S DISEASE  Edema  Tobacco abuse  Cirrhosis, nonalcoholic  Debility  Hypokalemia  CAP (community acquired pneumonia)  Dyspnea  Venous stasis  COPD exacerbation  Hypoalbuminemia  Anticoagulant long-term use  Pleural effusion  RVH (right ventricular hypertrophy)  Anemia  Hypoxia     1. Acute and probably chronic respiratory failure with hypercapnia and hypoxia. An ABG was ordered because her bicarbonate level was steadily increasing. Her pH is approximately within normal limits at 7.4, but her PCO2 was elevated at 72. I suspect, she has an element of chronic hypercapnia from COPD. She is not on oxygen chronically at home. We'll continue oxygen supplementation here and we'll reassess her need for home oxygen.  COPD with exacerbation and concomitant tobacco abuse.  The patient was strongly advised to stop smoking. We'll continue bronchodilators and the addition of IV steroids which I believe are helping decrease bronchospasms and congestion.  Community-acquired pneumonia. Clinically improving. We'll continue Rocephin and azithromycin.  Chronic atrial fibrillation with mild rapid ventricular response. Elevated heart rate is likely secondary to pulmonary process and bronchodilators. Digoxin was added. Diltiazem will be continued, but the dose may need to be increased. We'll continue anticoagulation with Coumadin. TSH is within normal limits.   Right ventricular hypertrophy, per 2-D echocardiogram. Ejection fraction was within normal limits. ProBNP was modestly elevated at less than 500.   Global body edema and pleural effusion in the setting of chronic cirrhosis. Clinically, she is not as  edematous. She has been on IV Lasix.. Aldactone was added. She has diuresed over 11 L since hospital admission.   Type 2 diabetes mellitus. Hemoglobin A1c 6.1. Capillary blood glucose is generally controlled. Lantus was added when Solu-Medrol was started.  Anemia. Her total iron is slightly low and her reticulocyte count is normal. Ferritin, vitamin B12, and folate level are pending. Stool Hemoccults are pending.  Plan:  1. Continue current management. 2. Discontinue Foley catheter. 3. Followup PT recommendations. 4. Discontinue IV Lasix. 5. Possible discharge tomorrow or in 2 days.     LOS: 5 days   Esmond Hinch 02/14/2012, 11:16 AM

## 2012-02-15 ENCOUNTER — Encounter (HOSPITAL_COMMUNITY): Payer: Self-pay | Admitting: Internal Medicine

## 2012-02-15 DIAGNOSIS — J9610 Chronic respiratory failure, unspecified whether with hypoxia or hypercapnia: Secondary | ICD-10-CM | POA: Diagnosis present

## 2012-02-15 DIAGNOSIS — R0902 Hypoxemia: Secondary | ICD-10-CM

## 2012-02-15 HISTORY — DX: Chronic respiratory failure, unspecified whether with hypoxia or hypercapnia: J96.10

## 2012-02-15 LAB — PROTIME-INR
INR: 2.45 — ABNORMAL HIGH (ref 0.00–1.49)
Prothrombin Time: 27 seconds — ABNORMAL HIGH (ref 11.6–15.2)

## 2012-02-15 LAB — OCCULT BLOOD X 1 CARD TO LAB, STOOL: Fecal Occult Bld: POSITIVE

## 2012-02-15 MED ORDER — LEVALBUTEROL TARTRATE 45 MCG/ACT IN AERO: 1.0000 | INHALATION_SPRAY | RESPIRATORY_TRACT | Status: DC | PRN

## 2012-02-15 MED ORDER — DIGOXIN 125 MCG PO TABS: 0.1250 mg | ORAL_TABLET | Freq: Every day | ORAL | Status: DC

## 2012-02-15 MED ORDER — CEFUROXIME AXETIL 500 MG PO TABS: 500.0000 mg | ORAL_TABLET | Freq: Two times a day (BID) | ORAL | Status: DC

## 2012-02-15 MED ORDER — SODIUM CHLORIDE 0.9 % IJ SOLN
INTRAMUSCULAR | Status: AC
  Administered 2012-02-15: 10 mL
  Filled 2012-02-15: qty 3

## 2012-02-15 MED ORDER — FUROSEMIDE 20 MG PO TABS: 20.0000 mg | ORAL_TABLET | Freq: Every day | ORAL | Status: DC

## 2012-02-15 MED ORDER — PREDNISONE 10 MG PO TABS: 10.0000 mg | ORAL_TABLET | Freq: Every day | ORAL | Status: DC

## 2012-02-15 MED ORDER — SPIRONOLACTONE 50 MG PO TABS: 50.0000 mg | ORAL_TABLET | Freq: Two times a day (BID) | ORAL | Status: DC

## 2012-02-15 MED ORDER — POTASSIUM CHLORIDE 10 MEQ PO TBCR: 10.0000 meq | EXTENDED_RELEASE_TABLET | Freq: Every day | ORAL | Status: DC

## 2012-02-15 NOTE — Discharge Summary (Signed)
Physician Discharge Summary  [name] WJX:[id number] DOB: Jan 01, 1936 DOA: 02/09/2012  PCP: Rudi Heap, MD  Admit date: 02/09/2012 Discharge date: 02/15/2012  Recommendations for Outpatient Follow-up:  1. The patient was discharged to home in improved condition. Home oxygen was started. Home health physical therapy was ordered. She will followup with her primary care physician as scheduled.  Discharge Diagnoses:   1. Community-acquired pneumonia. 2. Acute on probably chronic hypoxic and hypercapnic respiratory failure secondary to COPD with exacerbation, pneumonia, and possibly obesity hypoventilation syndrome.. The patient's oxygen saturation on room air was 86% at the time of discharge. Her ABG on 02/13/2012 on 3 L of oxygen revealed a pH of 7.4, PCO2 of 72.4, and PO2 of 57.4. Treated with out invasive intervention. 3. Volume overload/anasarca/pleural effusion secondary to cirrhosis, calcium channel blocker, and hypoalbuminemia. Acute congestive heart failure was not thought to not be the etiology of her acute respiratory failure at the time of discharge, though she did diurese on IV Lasix.Marland Kitchen Her ejection fraction was 60-65% per 2-D echocardiogram on 02/14/2012. 4. Tobacco abuse. The patient was strongly advised to stop smoking. 5. Chronic atrial fibrillation with rapid ventricular response. Chronically anticoagulated with Coumadin. Her INR was 2.45 at the time of discharge. 6. Chronic nonalcoholic cirrhosis. 7. Crohn's disease. 8. Chronic venous stasis skin changes of lower extremities. 9. Right ventricular hypertrophy per 2-D echocardiogram. 10. Hypokalemia, supplement and repleted. 11. Chronic iron deficiency anemia. The patient anemia panel revealed a total iron of 33, TIBC of 336, ferritin of 12, folate of 15.6, and vitamin B12 of 1026. Guaiac positive stool. The patient would benefit from a referral to gastroenterology for further evaluation. This will be deferred to her primary  care physician. 12. Type 2 diabetes mellitus. Fairly good control. Hemoglobin A1c 6.1. 13. Morbid obesity. 14. Physical deconditioning.   Discharge Condition: Improved.  Diet recommendation: Carbohydrate modified and heart healthy.  Filed Weights   02/13/12 0450 02/14/12 0514 02/15/12 0528  Weight: 109.1 kg (240 lb 8.4 oz) 109.045 kg (240 lb 6.4 oz) 110.4 kg (243 lb 6.2 oz)    History of present illness:  The patient is a 76 year old woman with a history significant for paroxysmal atrial fibrillation on Coumadin, hypertension, diabetes mellitus, non-alcoholic cirrhosis, and Crohn's disease. She presented to the emergency department on 02/09/2012 with a chief complaint of weight gain, shortness of breath, and increased leg swelling. In the emergency department, she was tachycardic with a heart rate in the 100s to 130s. She was afebrile. Her blood pressure was within normal limits. Her lab data were significant for a serum potassium of 3.1, hemoglobin of 10.0, normal troponin I., and a normal CBC. Her EKG revealed atrial fibrillation. Her chest x-ray revealed bilateral lower lobe airspace opacities, right greater than left. She was admitted for further evaluation and management.   Hospital Course:   She was initially thought to have congestive heart failure with exacerbation, however, her symptomatology was also consistent with pneumonia and acute bronchitis/COPD. Her pro BNP was only 475. She was given IV Lasix in the emergency department. She was also given IV diltiazem for rate control. She was continued on IV Lasix and restarted on her home dose of diltiazem. Coumadin was continued. Her INR was initially checked and it was therapeutic. IV antibiotics were not initially started, but the following day, azithromycin and Rocephin were started empirically. Bronchodilators were added for bronchospasms. Prednisone was also given for treatment of COPD exacerbation. Subsequently, prednisone was  discontinued in favor of Solu-Medrol due  to the ongoing bronchospasms. Mucinex was added. A 2-D echocardiogram was ordered for evaluation. It revealed preserved LV function but with right ventricular hypertrophy. There was no mention of diastolic dysfunction. Given these findings, it was believed that the patient's anasarca was secondary to a combination of cirrhosis, hypoalbuminemia, and calcium channel blocker.  During the hospital course, the patient's bicarbonate level on the basic metabolic panel gradually increased to 43. For this reason, an ABG was ordered on 3 L of oxygen. It revealed a pH of 7.4, PCO2 of 72.4, and PO2 of 57.4. Given that her pH was within normal limits, it appeared that the patient had chronic hypercapnic and hypoxic respiratory failure, possibly secondary to COPD, and ongoing tobacco abuse, and obesity hypoventilation syndrome. The oxygen was titrated accordingly. Tobacco cessation counseling was ordered.  The patient was also noted to be anemic. She reported a history of iron deficiency. The anemia panel ordered was consistent with iron deficiency anemia. She was maintained on iron supplementation. Her stool was also noted to be guaiac positive. She was instructed to discuss further evaluation with her primary care physician who would refer her to gastroenterology as needed.  The patient's heart rate continued to be elevated above 110 beats per minute. Titration of diltiazem was limited due to her low normal blood pressures. Therefore, digoxin was added. Her heart rate became more controlled on Lanoxin.  IV Lasix was eventually discontinued. She was started on spironolactone. Overall, she diuresed over 11 L. Her weight on admission was 250 pounds. At the time of discharge, it was 243 pounds and 6 ounces. She was discharged on Aldactone and oral Lasix.   The patient improved clinically and symptomatically, however, she remained hypoxic. Therefore, home oxygen was ordered. She  received 6 days of antibiotic therapy. She was discharged to home on 3 more days of Ceftin. She was also discharged on an Xopenex inhaler, and prednisone taper. She was instructed to discontinue Maxzide in light of ongoing treatment with Aldactone and Lasix.    Procedures: 2-D echocardiogram:  Left ventricle: The cavity size was normal. Wall thickness was normal. Systolic function was normal. The estimated ejection fraction was in the range of 60% to 65%. Wall motion was normal; there were no regional wall motion abnormalities. - Mitral valve: Calcified annulus. - Left atrium: The atrium was moderately dilated. - Right ventricle: The cavity size was normal. Wall thickness was moderately increased. - Right atrium: The atrium was mildly to moderately dilated. - Pericardium, extracardiac: A trivial posterior pericardial effusion was identified. There was a moderate-sized left pleural effusion. Impressions:  - Compared to the prior study performed 03/04/11, interval enlargement in left and right atria; left pleural effusion and minimal pericardial effusion now present. Transthoracic echocardiography. M-mode, complete 2D, spectral Doppler, and color Doppler. Height: Height: 149.9cm. Height: 59in. Weight: Weight: 112.5kg. Weight: 247.5lb. Body mass index: BMI: 50.1kg/m^2. Body surface area: BSA: 2.32m^2. Patient status: Inpatient. Location: Bedside.     Consultations:  None  Discharge Exam: Filed Vitals:   02/15/12 0731 02/15/12 0953 02/15/12 0955 02/15/12 1217  BP:      Pulse:      Temp:      TempSrc:      Resp:      Height:      Weight:      SpO2: 94% 86% 96% 93%    General: Pleasant alert obese 76 year old woman sitting up in a chair in no acute distress. Cardiovascular: Irregular, irregular. Respiratory: Rare wheezes, significantly less broncho- spasms  and crackles.  Discharge Instructions  Discharge Orders    Future Appointments: Provider: Department: Dept  Phone: Center:   07/11/2012 9:30 AM Malissa Hippo, MD [facility] December [phone] None     Future Orders Please Complete By Expires   Diet - low sodium heart healthy      Diet Carb Modified      Increase activity slowly      Discharge instructions      Comments:   Do not smoke. Where your oxygen as close to 24 hours daily as possible.  You were started on 2 new fluid medications. Your other fluid pill (triamterene-hydrochlorothiazide) was discontinued. A new medication called digoxin was added to your other medications for your heart rate.       Medication List     As of 02/15/2012  2:12 PM    STOP taking these medications         triamterene-hydrochlorothiazide 75-50 MG per tablet   Commonly known as: MAXZIDE      TAKE these medications         acetaminophen 500 MG tablet   Commonly known as: TYLENOL   Take 500 mg by mouth every 8 (eight) hours as needed. Pain      ALPRAZolam 0.5 MG tablet   Commonly known as: XANAX   Take 0.25-0.5 mg by mouth 2 (two) times daily. Anxiety      ASACOL HD 800 MG Tbec   Generic drug: Mesalamine   Take 1 tablet by mouth 2 (two) times daily.      cefUROXime 500 MG tablet   Commonly known as: CEFTIN   Take 1 tablet (500 mg total) by mouth 2 (two) times daily. Antibiotic to be taken over the next 3 days.      digoxin 0.125 MG tablet   Commonly known as: LANOXIN   Take 1 tablet (0.125 mg total) by mouth daily.      diltiazem 180 MG 24 hr capsule   Commonly known as: DILACOR XR   Take 180 mg by mouth daily.      ergocalciferol 50000 UNITS capsule   Commonly known as: VITAMIN D2   Take 50,000 Units by mouth once a week. Takes on Mondays      ferrous sulfate 325 (65 FE) MG tablet   Take 325 mg by mouth. Patient takes this medication 3 times per week      furosemide 20 MG tablet   Commonly known as: LASIX   Take 1 tablet (20 mg total) by mouth daily. New medication for fluid retention.      GLUCOPHAGE 1000 MG tablet    Generic drug: metFORMIN   Take 1,000 mg by mouth 2 (two) times daily with a meal.      lansoprazole 30 MG capsule   Commonly known as: PREVACID   Take 30 mg by mouth daily.      levalbuterol 45 MCG/ACT inhaler   Commonly known as: XOPENEX HFA   Inhale 1-2 puffs into the lungs every 4 (four) hours as needed for wheezing.      multivitamin tablet   Take 1 tablet by mouth daily.      potassium chloride 10 MEQ CR tablet   Commonly known as: KLOR-CON   Take 1 tablet (10 mEq total) by mouth daily. Take with furosemide (Lasix).      predniSONE 10 MG tablet   Commonly known as: DELTASONE   Take 1 tablet (10 mg total) by mouth daily. Starting tomorrow,  TAKE 6 TABLETS FOR 1 DAY; THEN 5 TABLETS NEXT DAY; THEN 4 TABLETS NEXT DAY; THEN 3 TABLETS NEXT DAY; THEN 2 TABLETS NEXT DAY; THEN 1 TABLET THE NEXT DAY; THEN STOP.      spironolactone 50 MG tablet   Commonly known as: ALDACTONE   Take 1 tablet (50 mg total) by mouth 2 (two) times daily. New medication for fluid retention.      VITAMIN B-12 IJ   Inject as directed every 30 (thirty) days.      warfarin 1 MG tablet   Commonly known as: COUMADIN   Take 1 mg by mouth daily. Takes 1 mg everyday except for Mon and Fri when she takes 1.5 mg.           Follow-up Information    Follow up with [facility]. (RN, PT, Aide, Sw, O2)    Contact information:   [address] [place] [address] [phone]      Follow up with Rudi Heap, MD. On 02/15/2012. (Monday September 23rd at 11:30am)    Contact information:   [address] [place] [address] [phone]           The results of significant diagnostics from this hospitalization (including imaging, microbiology, ancillary and laboratory) are listed below for reference.    Significant Diagnostic Studies: Dg Chest Port 1 View  02/09/2012  *RADIOLOGY REPORT*  Clinical Data: Shortness of breath.  PORTABLE CHEST - 1 VIEW  Comparison: 05/21/2009   Findings: Heart is mildly enlarged.  Patchy bilateral lobe airspace opacities, right greater than left.  Cannot exclude pneumonia, particularly in the right lung base.  No effusions.  No acute bony abnormality.  IMPRESSION: Bilateral lower lobe airspace opacities, right greater than left, cannot exclude pneumonia, particularly in the right lung base.  Stable cardiomegaly.   Original Report Authenticated By: Cyndie Chime, M.D.     Microbiology: Recent Results (from the past 240 hour(s))  MRSA PCR SCREENING     Status: Normal   Collection Time   02/09/12  7:35 PM      Component Value Range Status Comment   MRSA by PCR NEGATIVE  NEGATIVE Final      Labs: Basic Metabolic Panel:  Lab 02/14/12 6433 02/13/12 0623 02/12/12 0503 02/10/12 0438 02/09/12 1415  NA 132* 133* 133* 140 135  K 4.0 4.4 3.8 3.1* 3.1*  CL 87* 88* 89* 98 97  CO2 40* 43* 39* 38* 32  GLUCOSE 135* 119* 153* 104* 97  BUN 17 14 11 6 6   CREATININE 0.68 0.76 0.79 0.72 0.63  CALCIUM 8.8 8.6 8.5 8.4 8.7  MG -- -- -- -- 1.5  PHOS -- -- -- -- --   Liver Function Tests:  Lab 02/09/12 1415  AST 27  ALT 14  ALKPHOS 112  BILITOT 0.6  PROT 6.6  ALBUMIN 2.5*   No results found for this basename: LIPASE:5,AMYLASE:5 in the last 168 hours No results found for this basename: AMMONIA:5 in the last 168 hours CBC:  Lab 02/14/12 0556 02/12/12 0503 02/09/12 1415  WBC 8.9 9.5 5.0  NEUTROABS -- -- 3.1  HGB 10.6* 9.6* 10.0*  HCT 36.6 33.5* 33.4*  MCV 81.2 81.1 78.0  PLT 205 195 212   Cardiac Enzymes:  Lab 02/09/12 1415  CKTOTAL --  CKMB --  CKMBINDEX --  TROPONINI <0.30   BNP: BNP (last 3 results)  Basename 02/09/12 1415  PROBNP 475.2*   CBG:  Lab 02/15/12 1209  02/15/12 0748 02/14/12 2100 02/14/12 1648 02/14/12 1143  GLUCAP 143* 92 180* 116* 168*    Time coordinating discharge: Greater than 30 minutes  Signed:  Shelli Portilla  [facility] 02/15/2012, 2:12 PM

## 2012-02-15 NOTE — Evaluation (Signed)
Occupational Therapy Evaluation Patient Details Name: [name] MRN: [id number] DOB: [birth year] Today's Date: 02/15/2012 Time: 4010-2725 OT Time Calculation (min): 22 min  OT Assessment / Plan / Recommendation Clinical Impression  Patient is a 76 y/o female s/p Acute Respiratory Failure with Hypercapnia presenting to acute OT with all education complete. Patient was given HEP with green theraband for use at home. Therapist demonstrated exercises and patient returned demo successfully. Recommend  patient purchase shower chair with back and 3-in-1 commode and receive Home health OT. Acute OT will sign off.    OT Assessment  All further OT needs can be met in the next venue of care    Follow Up Recommendations  Home health OT       Equipment Recommendations  Tub/shower seat;3 in 1 bedside comode          Precautions / Restrictions Precautions Precautions: Fall   Pertinent Vitals/Pain No complaints of pain.       OT Diagnosis: Generalized weakness  OT Problem List: Decreased strength;Decreased activity tolerance   Visit Information  Last OT Received On: 02/15/12 Assistance Needed: +1    Subjective Data  Subjective: "I'm going to get therapy at home." Patient Stated Goal: To go home.   Prior Functioning  Vision/Perception  Home Living Lives With: Family Available Help at Discharge: Family Type of Home: House Home Access: Stairs to enter Secretary/administrator of Steps: 5 Entrance Stairs-Rails: Right Home Layout: One level Bathroom Shower/Tub: Health visitor: Standard Home Adaptive Equipment: Grab bars in shower;Walker - rolling;Straight cane Prior Function Level of Independence: Independent with assistive device(s) Able to Take Stairs?: Yes Driving: Yes Vocation: Retired Comments: pt lives with her 34 year old granddaughter...from the patient's description, her mobility was limited at baseline, although she did not use any device for  gait Communication Communication: No difficulties Dominant Hand: Right   Vision - Assessment Eye Alignment: Within Functional Limits  Cognition  Overall Cognitive Status: Appears within functional limits for tasks assessed/performed Arousal/Alertness: Awake/alert Orientation Level: Appears intact for tasks assessed Behavior During Session: [facility] for tasks performed    Extremity/Trunk Assessment Right Upper Extremity Assessment RUE ROM/Strength/Tone: Deficits RUE ROM/Strength/Tone Deficits: A/ROM WFL in all ranges. MMT: 3+/5 Left Upper Extremity Assessment LUE ROM/Strength/Tone: Deficits LUE ROM/Strength/Tone Deficits: A/ROM WFL in all ranges. MMT: 3+/5   Mobility  Shoulder Instructions  Transfers Transfers: Sit to Stand;Stand to Sit Sit to Stand: 5: Supervision;With upper extremity assist;From chair/3-in-1 Stand to Sit: 5: Supervision;With upper extremity assist;To chair/3-in-1 Details for Transfer Assistance: verbal cues for hand placement and backing to surface before sitting       Exercise General Exercises - Upper Extremity Shoulder Flexion: Strengthening;Theraband;Seated;5 reps;Both Theraband Level (Shoulder Flexion): Level 3 (Green) Shoulder Extension: Strengthening;Theraband;Both;5 reps;Seated Theraband Level (Shoulder Extension): Level 3 (Green) Shoulder ABduction: Both;10 reps Shoulder ADduction: 10 reps;Both Shoulder Horizontal ABduction: Strengthening;5 reps;Both;Seated;Theraband Theraband Level (Shoulder Horizontal Abduction): Level 3 (Green) Shoulder Horizontal ADduction: Strengthening;Both;5 reps;Seated;Theraband Theraband Level (Shoulder Horizontal Adduction): Level 3 (Green) Elbow Extension: Strengthening;Both;5 reps;Seated;Theraband Theraband Level (Elbow Extension): Level 3 (Green)       End of Session OT - End of Session Activity Tolerance: Patient tolerated treatment well Patient left: in chair;with call bell/phone within reach    [facility], OTR/L 02/15/2012, 2:39 PM

## 2012-02-15 NOTE — Progress Notes (Signed)
Physical Therapy Treatment Patient Details Name: SHAWNTA SCHLEGEL MRN: [id number] DOB: Jun 27, 1935 Today's Date: 02/15/2012 Time: 4098-1191 PT Time Calculation (min): 21 min 1 gt PT Assessment / Plan / Recommendation Comments on Treatment Session  Patient had ambulated to bathroom and back with nursing just prior to treatment but agreed to short distance of gait training. Pt completed 70' with RW;MIn guard;needing verbal cues to keep RW close;no LOB O2 sats with [place] 2L; seated-94% HR 139  standing after gait 92% with 2L portable    Follow Up Recommendations       Barriers to Discharge        Equipment Recommendations       Recommendations for Other Services    Frequency     Plan Discharge plan remains appropriate    Precautions / Restrictions Restrictions Weight Bearing Restrictions: No   Pertinent Vitals/Pain     Mobility  Transfers Sit to Stand: 5: Supervision Stand to Sit: 4: Min guard Details for Transfer Assistance: verbal cues for hand placement and backing to surface before sitting Ambulation/Gait Ambulation/Gait Assistance: 5: Supervision Ambulation Distance (Feet): 40 Feet Assistive device: Rolling walker Ambulation/Gait Assistance Details: verbal cueing needed to keep RW close Gait velocity: slow Stairs: No Wheelchair Mobility Wheelchair Mobility: No    Exercises General Exercises - Upper Extremity Shoulder ABduction: Both;10 reps Shoulder ADduction: 10 reps;Both General Exercises - Lower Extremity Long Arc Quad: Both;10 reps Toe Raises: Both;10 reps;Seated Heel Raises: 10 reps;Both;Seated   PT Diagnosis:    PT Problem List:   PT Treatment Interventions:     PT Goals    Visit Information  Last PT Received On: 02/15/12    Subjective Data      Cognition       Balance     End of Session PT - End of Session Equipment Utilized During Treatment: Gait belt Activity Tolerance: Patient tolerated treatment well Patient left: in chair;with call  bell/phone within reach;with chair alarm set   GP     Otillia Cordone ATKINSO 02/15/2012, 10:48 AM

## 2012-02-15 NOTE — Progress Notes (Signed)
ANTICOAGULATION CONSULT NOTE  Pharmacy Consult for Coumadin   Indication: atrial fibrillation  Allergies  Allergen Reactions  . Sulfonamide Derivatives Other (See Comments)    Felt funny   Patient Measurements: Height: 4\' 11"  (149.9 cm) Weight: 243 lb 6.2 oz (110.4 kg) IBW/kg (Calculated) : 43.2   Vital Signs: Temp: 97.6 F (36.4 C) (09/16 0528) Temp src: Oral (09/16 0528) BP: 119/66 mmHg (09/16 0528) Pulse Rate: 104  (09/16 0528)  Labs:  Alvira Philips 02/15/12 0555 02/14/12 0556 02/13/12 0623  HGB -- 10.6* --  HCT -- 36.6 --  PLT -- 205 --  APTT -- -- --  LABPROT 27.0* -- --  INR 2.45* -- --  HEPARINUNFRC -- -- --  CREATININE -- 0.68 0.76  CKTOTAL -- -- --  CKMB -- -- --  TROPONINI -- -- --    Estimated Creatinine Clearance: 68.3 ml/min (by C-G formula based on Cr of 0.68).  Medical History: Past Medical History  Diagnosis Date  . Hypertension     x 5 years  . Paroxysmal atrial fibrillation   . Anxiety   . Crohn's disease   . 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 No clear documentation of this  . Diabetes mellitus   . Neuromuscular disorder     periph neuropathy  . Shortness of breath   . Arthritis   . GERD (gastroesophageal reflux disease)   . Blood transfusion   . COPD (chronic obstructive pulmonary disease)   . Cirrhosis     non aloholic   . Pleural effusion 02/13/2012  . RVH (right ventricular hypertrophy) 02/13/2012  . Anemia 02/13/2012   Medications:  Scheduled:     . ALPRAZolam  0.25-0.5 mg Oral BID  . antiseptic oral rinse  15 mL Mouth Rinse BID  . azithromycin  250 mg Oral Daily  . cefTRIAXone (ROCEPHIN)  IV  1 g Intravenous Q24H  . digoxin  0.25 mg Oral Daily  . diltiazem  180 mg Oral Daily  . ferrous sulfate  325 mg Oral Q breakfast  . guaiFENesin  600 mg Oral BID  . insulin aspart  0-15 Units Subcutaneous TID WC  . insulin glargine  30 Units Subcutaneous QHS  . ipratropium  0.5 mg Nebulization Q4H  . levalbuterol   0.63 mg Nebulization Q4H  . mesalamine  800 mg Oral BID  . metFORMIN  1,000 mg Oral BID WC  . methylPREDNISolone (SOLU-MEDROL) injection  80 mg Intravenous Q8H  . multivitamin with minerals  1 tablet Oral Daily  . nystatin   Topical BID  . pantoprazole  40 mg Oral Daily  . potassium chloride  40 mEq Oral Daily  . sodium chloride      . spironolactone  50 mg Oral BID  . Vitamin D (Ergocalciferol)  50,000 Units Oral Weekly  . warfarin  1 mg Oral Custom  . warfarin  1.5 mg Oral Custom  . Warfarin - Pharmacist Dosing Inpatient   Does not apply q1800  . DISCONTD: furosemide  40 mg Intravenous BID   Assessment: 76 yo F on chronic warfarin 1mg  daily except 1.5mg  on Mon, Fri for Afib. INR therapeutic on admission.  Remains in goal range. No bleeding noted.   Goal of Therapy:  INR 2-3   Plan:  Continue home Coumadin regimen.  Check INR on Mon-Wed-Fri  Valrie Hart A 02/15/2012,8:51 AM

## 2012-02-15 NOTE — Progress Notes (Signed)
IV removed, site WNL.  Pt given d/c instructions and new prescriptions.  Discussed home care with patient including low salt diet, daily weights and activity.  Discussed information on CHF handout regarding care, medications, prevention, and when to call 911. Discussed home medications (new and current) discussed how and when to take, patient verbalizes understanding, teachback completed. F/U appointment in place, pt states they will keep appointment. Pt is stable at this time.

## 2012-02-15 NOTE — Progress Notes (Signed)
Pt ambulated with walker to bathroom on RA, upon sitting down checked O2 saturation, 86% on RA.  2lpm applied, O2 sats up to 96%.

## 2012-02-15 NOTE — Progress Notes (Signed)
Respiratory Therapy Note- Prior to nebulizer this AM, Pt. Found asleep and off O2. Sp02 60, placed back on at 3l/min [place], sp02 now 94%. RN notified, [name] was arouseable, but sleepy. Cont to monitor.

## 2012-02-15 NOTE — Progress Notes (Signed)
O2 sats= 86% on RA at rest.

## 2012-02-29 ENCOUNTER — Encounter: Payer: Self-pay | Admitting: Cardiology

## 2012-03-02 ENCOUNTER — Encounter: Payer: Self-pay | Admitting: Cardiology

## 2012-03-24 ENCOUNTER — Other Ambulatory Visit: Payer: Self-pay | Admitting: Cardiology

## 2012-04-14 ENCOUNTER — Encounter (INDEPENDENT_AMBULATORY_CARE_PROVIDER_SITE_OTHER): Payer: Self-pay

## 2012-04-25 ENCOUNTER — Other Ambulatory Visit: Payer: Self-pay | Admitting: Dermatology

## 2012-05-27 ENCOUNTER — Encounter (INDEPENDENT_AMBULATORY_CARE_PROVIDER_SITE_OTHER): Payer: Self-pay

## 2012-06-27 ENCOUNTER — Encounter: Payer: Self-pay | Admitting: Cardiology

## 2012-07-11 ENCOUNTER — Ambulatory Visit (INDEPENDENT_AMBULATORY_CARE_PROVIDER_SITE_OTHER): Payer: Medicare Other | Admitting: Internal Medicine

## 2012-07-11 ENCOUNTER — Encounter (INDEPENDENT_AMBULATORY_CARE_PROVIDER_SITE_OTHER): Payer: Self-pay | Admitting: Internal Medicine

## 2012-07-11 DIAGNOSIS — G8929 Other chronic pain: Secondary | ICD-10-CM

## 2012-07-11 DIAGNOSIS — R1310 Dysphagia, unspecified: Secondary | ICD-10-CM

## 2012-07-11 DIAGNOSIS — R1031 Right lower quadrant pain: Secondary | ICD-10-CM

## 2012-07-11 DIAGNOSIS — K7460 Unspecified cirrhosis of liver: Secondary | ICD-10-CM

## 2012-07-11 DIAGNOSIS — R195 Other fecal abnormalities: Secondary | ICD-10-CM

## 2012-07-11 LAB — AFP TUMOR MARKER: AFP-Tumor Marker: 3.3 ng/mL (ref 0.0–8.0)

## 2012-07-11 NOTE — Progress Notes (Signed)
Presenting complaint;  Heme positive stool, dysphagia and decreasing stool caliber.  Subjective:  Patient is 77 year old Caucasian female with history of chronic GERD, esophageal stricture as well as Crohn's disease who is here for scheduled visit. She was last seen 6 months ago. She was recently noted to have heme positive stool at Dr. Arturo Morton office. She denies melena or rectal bleeding. She does complain of pain in right low quadrant as well as hypogastric region relieved with bowel movement. She reports in caliber of her stool. She denies diarrhea but at times she feels constipated. She noted change in stool caliber 3 or 4 months ago. She also complains of dysphagia to solids. This symptom occurs couple of times a week but not daily. Food bolus always passes down spontaneously. Heartburn is well controlled with therapy. Her appetite is good. She has lost 35 pounds since her last visit. She states most of this weight loss occurred while she was hospitalized at [facility] with pneumonia bronchitis and possibly also had CHF. She has gained no more than 4 pounds since discharge.   Current Medications: Current Outpatient Prescriptions  Medication Sig Dispense Refill  . acetaminophen (TYLENOL) 500 MG tablet Take 500 mg by mouth every 8 (eight) hours as needed. Pain      . ALPRAZolam (XANAX) 0.5 MG tablet Take 0.25-0.5 mg by mouth 2 (two) times daily. Anxiety      . cholecalciferol (VITAMIN D) 1000 UNITS tablet Take 1,000 Units by mouth daily.      . digoxin (LANOXIN) 0.125 MG tablet Take 1 tablet (0.125 mg total) by mouth daily.  30 tablet  2  . diltiazem (CARDIZEM CD) 180 MG 24 hr capsule TAKE ONE CAPSULE BY MOUTH ONE TIME DAILY  30 capsule  5  . ferrous sulfate 325 (65 FE) MG tablet Take 325 mg by mouth. Patient takes this medication 3 times per week      . furosemide (LASIX) 20 MG tablet Take 1 tablet (20 mg total) by mouth daily. New medication for fluid retention.  30 tablet  20  .  lansoprazole (PREVACID) 30 MG capsule Take 30 mg by mouth daily.      Marland Kitchen levalbuterol (XOPENEX HFA) 45 MCG/ACT inhaler Inhale 1-2 puffs into the lungs every 4 (four) hours as needed for wheezing.  1 Inhaler  12  . Mesalamine (ASACOL HD) 800 MG TBEC Take 1 tablet by mouth 2 (two) times daily.        . metFORMIN (GLUCOPHAGE) 1000 MG tablet Take 1,000 mg by mouth 2 (two) times daily with a meal.        . Multiple Vitamin (MULTIVITAMIN) tablet Take 1 tablet by mouth daily.        . potassium chloride (KLOR-CON) 10 MEQ CR tablet Take 1 tablet (10 mEq total) by mouth daily. Take with furosemide (Lasix).  30 tablet  2  . spironolactone (ALDACTONE) 50 MG tablet Take 1 tablet (50 mg total) by mouth 2 (two) times daily. New medication for fluid retention.  60 tablet  2  . warfarin (COUMADIN) 1 MG tablet Take 1 mg by mouth daily. Takes 1 mg everyday except for Wednesday she takes 1/2 mg       No current facility-administered medications for this visit.     Objective: Blood pressure 120/70, pulse 68, temperature 97.6 F (36.4 C), temperature source Oral, resp. rate 20, height 4\' 11"  (1.499 m), weight 206 lb 6.4 oz (93.622 kg). Patient is alert and does not have asterixis.  Scar noted at left nasolabial fold. Conjunctiva is pink. Sclera is nonicteric Oropharyngeal mucosa is normal. No neck masses or thyromegaly noted. Cardiac exam with regular rhythm normal S1 and S2. No murmur or gallop noted. Lungs are clear to auscultation. Abdomen is full with umbilicus hernia which is soft and reducible. She has mild periumblical tenderness. No organomegaly or masses noted.  Rectal examination deferred She has both pitting and nonpitting pretibial edema.  Labs/studies Results: Blood work from Dr. Kathi Der office done on 06/27/2012. WBC 4.9, H&H 11.2 and 35 MCV 77.9 and platelet count 175K Fecal occult blood positive. Electrolytes normal, BUN 10 creatinine 0.78 calcium 9.2 Glucose 91. Bilirubin 4.8, AP 88, AST  23, ALT 11, albumin 3.0.  Assessment:  #1. Heme positive stool with mildly decreased hemoglobin. Suspect heme positive stool may be related to her Crohn's disease. She also has history of duodenal ulcer but on a followup study if it completely healed. She needs to have her upper and lower GI tract to evaluate as discussed below. #2. Lower abdominal pain possibly secondary to poor colonic evaluation due to recurrent anorectal stricture which was last dilated over 3 years ago. #3 Crohn's disease. Suspect recurrent anorectal stricture. #4. Cirrhosis secondary to NAFLD. Reasonably preserved hepatic function except albumin is low. Last ultrasound was one year ago. #5. Solid food dysphagia. She has history of esophageal stricture and was last dilated over 3 years ago.   Plan:  Abdominopelvic CT with contrast to review her liver and also lower abdomen given history of abdominal pain. AFP. EGD with ED and colonoscopy with stricture dilation within the next few weeks. Patient will call to arrange. Office visit in 6 months.

## 2012-07-11 NOTE — Patient Instructions (Signed)
Physician will contact you with results of blood work and CT when completed. Esophageal dilation and colonoscopy to be scheduled within the next few months(please call office when you're ready)

## 2012-07-18 ENCOUNTER — Ambulatory Visit (HOSPITAL_COMMUNITY)
Admission: RE | Admit: 2012-07-18 | Discharge: 2012-07-18 | Disposition: A | Discharge status: Home / Self Care | Payer: Medicare Other | Source: Ambulatory Visit | Attending: Internal Medicine | Admitting: Internal Medicine

## 2012-07-18 DIAGNOSIS — G8929 Other chronic pain: Secondary | ICD-10-CM | POA: Insufficient documentation

## 2012-07-18 DIAGNOSIS — R1031 Right lower quadrant pain: Secondary | ICD-10-CM | POA: Insufficient documentation

## 2012-07-18 DIAGNOSIS — Z9049 Acquired absence of other specified parts of digestive tract: Secondary | ICD-10-CM | POA: Insufficient documentation

## 2012-07-18 DIAGNOSIS — K7460 Unspecified cirrhosis of liver: Secondary | ICD-10-CM | POA: Insufficient documentation

## 2012-07-18 MED ORDER — IOHEXOL 300 MG/ML  SOLN
100.0000 mL | Freq: Once | INTRAMUSCULAR | Status: AC | PRN
  Administered 2012-07-18: 100 mL via INTRAVENOUS

## 2012-07-20 ENCOUNTER — Telehealth (INDEPENDENT_AMBULATORY_CARE_PROVIDER_SITE_OTHER): Payer: Self-pay | Admitting: *Deleted

## 2012-07-20 NOTE — Telephone Encounter (Signed)
Per Dr.Rehman the patient will need to have repeat labs in 6 months

## 2012-07-21 ENCOUNTER — Encounter (INDEPENDENT_AMBULATORY_CARE_PROVIDER_SITE_OTHER): Payer: Self-pay

## 2012-07-25 ENCOUNTER — Encounter (INDEPENDENT_AMBULATORY_CARE_PROVIDER_SITE_OTHER): Payer: Self-pay | Admitting: *Deleted

## 2012-07-25 ENCOUNTER — Other Ambulatory Visit (INDEPENDENT_AMBULATORY_CARE_PROVIDER_SITE_OTHER): Payer: Self-pay | Admitting: *Deleted

## 2012-07-25 DIAGNOSIS — R935 Abnormal findings on diagnostic imaging of other abdominal regions, including retroperitoneum: Secondary | ICD-10-CM

## 2012-07-25 DIAGNOSIS — R1319 Other dysphagia: Secondary | ICD-10-CM

## 2012-07-26 ENCOUNTER — Encounter (HOSPITAL_COMMUNITY): Payer: Self-pay | Admitting: Pharmacy Technician

## 2012-08-01 ENCOUNTER — Encounter (HOSPITAL_COMMUNITY): Payer: Self-pay | Admitting: *Deleted

## 2012-08-01 ENCOUNTER — Encounter (HOSPITAL_COMMUNITY)
Admission: RE | Disposition: A | Discharge status: Home / Self Care | Payer: Self-pay | Source: Ambulatory Visit | Attending: Internal Medicine

## 2012-08-01 ENCOUNTER — Ambulatory Visit (HOSPITAL_COMMUNITY)
Admission: RE | Admit: 2012-08-01 | Discharge: 2012-08-01 | Disposition: A | Discharge status: Home / Self Care | Payer: Medicare Other | Source: Ambulatory Visit | Attending: Internal Medicine | Admitting: Internal Medicine

## 2012-08-01 DIAGNOSIS — K315 Obstruction of duodenum: Secondary | ICD-10-CM

## 2012-08-01 DIAGNOSIS — K222 Esophageal obstruction: Secondary | ICD-10-CM | POA: Insufficient documentation

## 2012-08-01 DIAGNOSIS — R109 Unspecified abdominal pain: Secondary | ICD-10-CM | POA: Insufficient documentation

## 2012-08-01 DIAGNOSIS — R1310 Dysphagia, unspecified: Secondary | ICD-10-CM | POA: Insufficient documentation

## 2012-08-01 DIAGNOSIS — K56609 Unspecified intestinal obstruction, unspecified as to partial versus complete obstruction: Secondary | ICD-10-CM | POA: Insufficient documentation

## 2012-08-01 DIAGNOSIS — E119 Type 2 diabetes mellitus without complications: Secondary | ICD-10-CM | POA: Insufficient documentation

## 2012-08-01 DIAGNOSIS — K289 Gastrojejunal ulcer, unspecified as acute or chronic, without hemorrhage or perforation: Secondary | ICD-10-CM

## 2012-08-01 DIAGNOSIS — K5090 Crohn's disease, unspecified, without complications: Secondary | ICD-10-CM

## 2012-08-01 DIAGNOSIS — Z01812 Encounter for preprocedural laboratory examination: Secondary | ICD-10-CM | POA: Insufficient documentation

## 2012-08-01 DIAGNOSIS — K624 Stenosis of anus and rectum: Secondary | ICD-10-CM

## 2012-08-01 DIAGNOSIS — K633 Ulcer of intestine: Secondary | ICD-10-CM | POA: Insufficient documentation

## 2012-08-01 DIAGNOSIS — Z9049 Acquired absence of other specified parts of digestive tract: Secondary | ICD-10-CM | POA: Insufficient documentation

## 2012-08-01 DIAGNOSIS — J4489 Other specified chronic obstructive pulmonary disease: Secondary | ICD-10-CM | POA: Insufficient documentation

## 2012-08-01 DIAGNOSIS — I10 Essential (primary) hypertension: Secondary | ICD-10-CM | POA: Insufficient documentation

## 2012-08-01 HISTORY — PX: BALLOON DILATION: SHX5330

## 2012-08-01 HISTORY — PX: MALONEY DILATION: SHX5535

## 2012-08-01 HISTORY — PX: SAVORY DILATION: SHX5439

## 2012-08-01 HISTORY — PX: COLONOSCOPY WITH ESOPHAGOGASTRODUODENOSCOPY (EGD): SHX5779

## 2012-08-01 LAB — GLUCOSE, CAPILLARY: Glucose-Capillary: 97 mg/dL (ref 70–99)

## 2012-08-01 SURGERY — COLONOSCOPY WITH ESOPHAGOGASTRODUODENOSCOPY (EGD)
Anesthesia: Moderate Sedation

## 2012-08-01 MED ORDER — LIDOCAINE HCL 2 % EX GEL
CUTANEOUS | Status: AC
  Filled 2012-08-01: qty 30

## 2012-08-01 MED ORDER — SODIUM CHLORIDE 0.45 % IV SOLN
INTRAVENOUS | Status: DC
  Administered 2012-08-01: 08:00:00 via INTRAVENOUS

## 2012-08-01 MED ORDER — MEPERIDINE HCL 50 MG/ML IJ SOLN
INTRAMUSCULAR | Status: DC | PRN
  Administered 2012-08-01 (×2): 25 mg via INTRAVENOUS

## 2012-08-01 MED ORDER — LIDOCAINE HCL 2 % EX GEL
CUTANEOUS | Status: DC | PRN
  Administered 2012-08-01: 1

## 2012-08-01 MED ORDER — DOCUSATE SODIUM 100 MG PO CAPS: 100.0000 mg | ORAL_CAPSULE | Freq: Two times a day (BID) | ORAL | Status: DC

## 2012-08-01 MED ORDER — MIDAZOLAM HCL 5 MG/5ML IJ SOLN
INTRAMUSCULAR | Status: DC | PRN
  Administered 2012-08-01: 2 mg via INTRAVENOUS
  Administered 2012-08-01: 1 mg via INTRAVENOUS
  Administered 2012-08-01: 2 mg via INTRAVENOUS
  Administered 2012-08-01 (×3): 1 mg via INTRAVENOUS

## 2012-08-01 MED ORDER — MIDAZOLAM HCL 5 MG/5ML IJ SOLN
INTRAMUSCULAR | Status: AC
  Filled 2012-08-01: qty 10

## 2012-08-01 MED ORDER — DIGOXIN 0.25 MG/ML IJ SOLN
INTRAMUSCULAR | Status: AC
  Filled 2012-08-01: qty 2

## 2012-08-01 MED ORDER — MEPERIDINE HCL 50 MG/ML IJ SOLN
INTRAMUSCULAR | Status: AC
  Filled 2012-08-01: qty 1

## 2012-08-01 MED ORDER — STERILE WATER FOR IRRIGATION IR SOLN
Status: DC | PRN
  Administered 2012-08-01: 07:00:00

## 2012-08-01 MED ORDER — DIGOXIN 0.25 MG/ML IJ SOLN
0.2500 mg | Freq: Once | INTRAMUSCULAR | Status: AC
  Administered 2012-08-01: 0.25 mg via INTRAVENOUS

## 2012-08-01 MED ORDER — BUTAMBEN-TETRACAINE-BENZOCAINE 2-2-14 % EX AERO
INHALATION_SPRAY | CUTANEOUS | Status: DC | PRN
  Administered 2012-08-01: 2 via TOPICAL

## 2012-08-01 NOTE — H&P (Signed)
[name] is an 77 y.o. female.   Chief Complaint: Patient is here for EGD, ED and colonoscopy with anastomotic and rectal stricture dilation. HPI: Patient is 77 year old Caucasian female who is a chronic GERD and history of esophageal stricture was noted recurrent solid food dysphagia. Her heart and well controlled with therapy. She also complains of change in caliber of stools since of incomplete evacuation. She denies melena or rectal bleeding. She has history of ileocolonic and rectal stricture which was last dilated in 3 years at [facility] in [facility]. Recent abdominopelvic CT showed narrowing and wall thickening in the region of ileocolonic anastomosis. She has remained stable in the last several weeks. She states she stopped enoxaparin one week ago thinking that she did not need this medication. She denies chest pain or shortness of breath.  Past Medical History  Diagnosis Date  . Hypertension     x 5 years  . Paroxysmal atrial fibrillation   . Anxiety   . Crohn's disease   . 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 No clear documentation of this  . Diabetes mellitus   . Neuromuscular disorder     periph neuropathy  . Shortness of breath   . Arthritis   . GERD (gastroesophageal reflux disease)   . Blood transfusion   . COPD (chronic obstructive pulmonary disease)   . Cirrhosis     non aloholic   . Pleural effusion 02/13/2012  . RVH (right ventricular hypertrophy) 02/13/2012  . Anemia 02/13/2012  . Chronic respiratory failure 02/15/2012    With hypoxia and hypercapnia.  . Anasarca 01/2012    Ejection fraction 60-65%    Past Surgical History  Procedure Laterality Date  . Basal cell carcinoma excision    . Abdominal surgery      multiple  . Vesicovaginal fistula closure w/ tah    . Cancers resected    . Appendectomy    . Abdominal hysterectomy    . Tubal ligation    . Cholecystectomy    . Colon surgery      hx bleeding ulcer  . Mass  excision  06/23/2011    Procedure: EXCISION MASS;  Surgeon: Tami Ribas, MD;  Location: [facility];  Service: Orthopedics;  Laterality: Left;  left hand excision mass with acell placement  . Left hand  06/23/11    Removed Squamous cell , and also did graft  . Mass excision  11/02/2011    Procedure: EXCISION MASS;  Surgeon: Tami Ribas, MD;  Location: [facility];  Service: Orthopedics;  Laterality: Left;  EXCISION MASS LEFT HAND with full thickness skin graft from left upper arm    Family History  Problem Relation Age of Onset  . Coronary artery disease Father   . Heart attack Father   . Coronary artery disease Mother   . Coronary artery disease Brother   . Heart attack Brother   . Pancreatic cancer Brother   . Heart attack Brother   . Leukemia Daughter   . Healthy Daughter   . Colon cancer Neg Hx    Social History:  reports that she has been smoking Cigarettes.  She has a 20 pack-year smoking history. She has never used smokeless tobacco. She reports that she does not drink alcohol or use illicit drugs.  Allergies:  Allergies  Allergen Reactions  . Sulfonamide Derivatives Other (See Comments)    Felt funny    Medications Prior to Admission  Medication Sig Dispense Refill  . acetaminophen (TYLENOL) 500 MG tablet Take 500 mg by mouth every 8 (eight) hours as needed. Pain      . ALPRAZolam (XANAX) 0.5 MG tablet Take 0.25-0.5 mg by mouth 2 (two) times daily. Anxiety      . cholecalciferol (VITAMIN D) 1000 UNITS tablet Take 1,000 Units by mouth daily.      . digoxin (LANOXIN) 0.125 MG tablet Take 1 tablet (0.125 mg total) by mouth daily.  30 tablet  2  . diltiazem (CARDIZEM CD) 180 MG 24 hr capsule TAKE ONE CAPSULE BY MOUTH ONE TIME DAILY  30 capsule  5  . ferrous sulfate 325 (65 FE) MG tablet Take 325 mg by mouth. Patient takes this medication 3 times per week      . furosemide (LASIX) 20 MG tablet Take 1 tablet (20 mg total) by mouth daily. New  medication for fluid retention.  30 tablet  20  . lansoprazole (PREVACID) 30 MG capsule Take 30 mg by mouth daily.      Marland Kitchen levalbuterol (XOPENEX HFA) 45 MCG/ACT inhaler Inhale 1-2 puffs into the lungs every 4 (four) hours as needed for wheezing.  1 Inhaler  12  . Mesalamine (ASACOL HD) 800 MG TBEC Take 1 tablet by mouth 2 (two) times daily.        . metFORMIN (GLUCOPHAGE) 1000 MG tablet Take 1,000 mg by mouth 2 (two) times daily with a meal.        . Multiple Vitamin (MULTIVITAMIN) tablet Take 1 tablet by mouth daily.        . potassium chloride (KLOR-CON) 10 MEQ CR tablet Take 1 tablet (10 mEq total) by mouth daily. Take with furosemide (Lasix).  30 tablet  2  . spironolactone (ALDACTONE) 50 MG tablet Take 1 tablet (50 mg total) by mouth 2 (two) times daily. New medication for fluid retention.  60 tablet  2  . warfarin (COUMADIN) 1 MG tablet Take 1 mg by mouth daily. Takes 1 mg everyday except for Wednesday she takes 1/2 mg        Results for orders placed during the hospital encounter of 08/01/12 (from the past 48 hour(s))  GLUCOSE, CAPILLARY     Status: None   Collection Time    08/01/12  7:19 AM      Result Value Range   Glucose-Capillary 97  70 - 99 mg/dL   Comment 1 Documented in Chart     No results found.  ROS  Blood pressure 143/66, pulse 123, temperature 98.5 F (36.9 C), temperature source Oral, resp. rate 17, height 4\' 11"  (1.499 m), weight 199 lb (90.266 kg), SpO2 96.00%. Physical Exam  Constitutional: She appears well-developed and well-nourished.  HENT:  Mouth/Throat: Oropharynx is clear and moist.  Eyes: Conjunctivae are normal. No scleral icterus.  Neck: No thyromegaly present.  Cardiovascular:  Irregular rhythm normal S1 and S2. No murmur or gallop noted.  Respiratory: Effort normal and breath sounds normal.  GI:  Full abdomen with umbilical hernia which is reducible. Abdomen is soft and nontender without organomegaly or masses.  Musculoskeletal: Edema: pitting  and nonpitting pretibial edema.  Lymphadenopathy:    She has no cervical adenopathy.  Neurological: She is alert.  Skin: Skin is warm and dry.  Multiple keratoses noted over face neck upper chest and lower extremities.     Assessment/Plan Solid food dysphagia in a patient with history of esophageal stricture. Recurrent anastomotic and rectal stricture secondary to Crohn's disease. EGD with  ED and colonoscopy with balloon dilation of strictures.  REHMAN,NAJEEB U 08/01/2012, 7:48 AM

## 2012-08-01 NOTE — Op Note (Addendum)
EGD PROCEDURE REPORT  PATIENT:  [name]  MR#:  [id number] Birthdate:  Oct 27, 1935, 77 y.o., female Endoscopist:  Dr. Malissa Hippo, MD Referred By:  Dr. Rudi Heap, MD Procedure Date: 08/01/2012  Procedure:   EGD, ED & Colonoscopy.  Indications:  Patient is 77 year old Caucasian female who is history of GERD and esophageal stricture who presents with recurrent solid food dysphagia. Esophageal stricture was last dilated over three years ago. She also complains of lower abdominal pain decrease in caliber of stools and difficult defecation. she has history of Crohn disease and suspected to have recurrent strictures. His last dilation was over 3 years ago at [facility] in [facility].          Informed Consent:  The risks, benefits, alternatives & imponderables which include, but are not limited to, bleeding, infection, perforation, drug reaction and potential missed lesion have been reviewed.  The potential for biopsy, lesion removal, esophageal dilation, etc. have also been discussed.  Questions have been answered.  All parties agreeable.  Please see history & physical in medical record for more information.  Medications:  Demerol 50 mg IV Versed 8 mg IV Cetacaine spray topically for oropharyngeal anesthesia  EGD  Description of procedure:  The endoscope was introduced through the mouth and advanced to the second portion of the duodenum without difficulty or limitations. The mucosal surfaces were surveyed very carefully during advancement of the scope and upon withdrawal. For Z. Findings:  Esophagus:  Mucosa of the esophagus was normal. No varices is identified. Soft stricture noted at GE junction.  GEJ:  40 cm Stomach:  Stomach was empty and distended very well with insufflation. Folds in the proximal stomach were normal. Examination of mucosa at body antrum pyloric channel as well as angularis fundus and cardia was normal. No varices is identified. Duodenum:  Short bulb  noncritical bulbar stricture but no active ulcer identified. Post bulbar mucosa was normal.  Therapeutic/Diagnostic Maneuvers Performed:   Esophagus dilated by passing 54 French Maloney dilator resulting in mucosal disruption just distal to UES and at GE junction.  COLONOSCOPY Description of procedure:  After a digital rectal exam was performed, that colonoscope was advanced from the anus through the rectum and colon to the area of the cecum, ileocecal valve and appendiceal orifice. The cecum was deeply intubated. These structures were well-seen and photographed for the record. From the level of the cecum and ileocecal valve, the scope was slowly and cautiously withdrawn. The mucosal surfaces were carefully surveyed utilizing scope tip to flexion to facilitate fold flattening as needed. The scope was pulled down into the rectum where a thorough exam including retroflexion was performed.  Findings:   Prep satisfactory. Stricture noted just proximal to ileocolonic anastomosis. Diameter estimated to be 10-11 mm. Linear ulcers noted involving mucosa at distal to the stricture. Few erosions noted involving mucosa of small bowel proximal to anastomosis. Neoterminal ileum was examined for 20 cm. Mucosa of the rest of the colon and rectum was normal. Stricture with scar noted at anorectal junction.   Therapeutic/Diagnostic Maneuvers Performed:  Distal small bowel stricture was dilated with a balloon initially to 12 mm and subsequently 13.5 mm. Less resistance noted as the scope was passed across it closed dilation. Ano- rectal stricture was dilated to 15 mm with same balloon.  Complications:  None  Colon Withdrawal Time:  8  minutes  Impression:  EGD findings. No evidence of esophageal or gastric varices. Soft stricture at GE junction. Noncritical bulbar stricture.  Esophageal dilation with 54 French Maloney dilator resulting in mucosal disruption at proximal esophagus indicative of web in mucosal  disruption at GEJ. Colonoscopy findings. Stricture at anorectal junction dilated with a balloon to 15 mm. Scattered erosions involving mucosa of small bowel proximal to ileocolonic anastomosis. 10-11 mm stricture just proximal to anastomosis with a linear ulcers distal to the stricture. The stricture was dilated with a balloon to 13.5 mm. Mucosa of rest of the colon was normal.   Recommendations:  Standard instructions given. Patient advised to go back on Lanoxin and check with  Dr. Christell Constant whether or not this medication could be discontinued. Resume usual medications including warfarin. Colace 100 mg by mouth twice a day. Office visit in 3 months.  REHMAN,NAJEEB U  08/01/2012 8:47 AM  CC: Dr. Rudi Heap, MD & Dr. Bonnetta Barry ref. provider found

## 2012-08-04 ENCOUNTER — Encounter (HOSPITAL_COMMUNITY): Payer: Self-pay | Admitting: Internal Medicine

## 2012-09-07 ENCOUNTER — Encounter: Payer: Self-pay | Admitting: Cardiology

## 2012-09-07 ENCOUNTER — Ambulatory Visit (INDEPENDENT_AMBULATORY_CARE_PROVIDER_SITE_OTHER): Payer: Medicare Other | Admitting: Cardiology

## 2012-09-07 VITALS — BP 122/66 | HR 60 | Ht 59.0 in | Wt 200.0 lb

## 2012-09-07 DIAGNOSIS — I4891 Unspecified atrial fibrillation: Secondary | ICD-10-CM

## 2012-09-07 NOTE — Progress Notes (Signed)
HP The patient presents for followup of her atrial fibrillation and lower extremity edema.  She was hospitalized in September [facility] with respiratory failure. I have reviewed these notes. She was thought to have a community-acquired pneumonia. She is found to have COPD. Echocardiography did demonstrate some RV dysfunction related to her chronic lung disease. At that time she was diureses as well as treated for her pneumonia. She has since lost about 40 pounds and has been keeping a small. She seems to be more motivated than ever with salt, fluid restrictions, daily weights and diet. She is also on digoxin which was started in the hospital secondary to rapid rate.  I have reviewed the LAD was recently done in January and her creatinine is stable with the addition of Lasix and her high dose of spironolactone.   The patient denies any new symptoms such as chest discomfort, neck or arm discomfort. There has been no new shortness of breath, PND or orthopnea. There have been no reported palpitations, presyncope or syncope.  She is starting to do a little walking.   Allergies  Allergen Reactions  . Sulfonamide Derivatives Other (See Comments)    Felt funny    Current Outpatient Prescriptions  Medication Sig Dispense Refill  . acetaminophen (TYLENOL) 500 MG tablet Take 500 mg by mouth every 8 (eight) hours as needed. Pain      . ALPRAZolam (XANAX) 0.5 MG tablet Take 0.25-0.5 mg by mouth 2 (two) times daily. Anxiety      . cholecalciferol (VITAMIN D) 1000 UNITS tablet Take 1,000 Units by mouth daily.      . digoxin (LANOXIN) 0.125 MG tablet Take 1 tablet (0.125 mg total) by mouth daily.  30 tablet  2  . diltiazem (CARDIZEM CD) 180 MG 24 hr capsule TAKE ONE CAPSULE BY MOUTH ONE TIME DAILY  30 capsule  5  . docusate sodium (COLACE) 100 MG capsule Take 1 capsule (100 mg total) by mouth 2 (two) times daily.  1 capsule  0  . ferrous sulfate 325 (65 FE) MG tablet Take 325 mg by mouth. Patient takes this  medication 3 times per week      . furosemide (LASIX) 20 MG tablet Take 1 tablet (20 mg total) by mouth daily. New medication for fluid retention.  30 tablet  20  . lansoprazole (PREVACID) 30 MG capsule Take 30 mg by mouth daily.      Marland Kitchen levalbuterol (XOPENEX HFA) 45 MCG/ACT inhaler Inhale 1-2 puffs into the lungs every 4 (four) hours as needed for wheezing.  1 Inhaler  12  . metFORMIN (GLUCOPHAGE) 1000 MG tablet Take 1,000 mg by mouth 2 (two) times daily with a meal.        . Multiple Vitamin (MULTIVITAMIN) tablet Take 1 tablet by mouth daily.        . potassium chloride (KLOR-CON) 10 MEQ CR tablet Take 1 tablet (10 mEq total) by mouth daily. Take with furosemide (Lasix).  30 tablet  2  . spironolactone (ALDACTONE) 50 MG tablet Take 1 tablet (50 mg total) by mouth 2 (two) times daily. New medication for fluid retention.  60 tablet  2  . warfarin (COUMADIN) 1 MG tablet Take 1 mg by mouth daily. Takes 1 mg everyday except for Wednesday she takes 1/2 mg       No current facility-administered medications for this visit.    Past Medical History  Diagnosis Date  . Hypertension     x 5 years  . Paroxysmal  atrial fibrillation   . Anxiety   . Crohn's disease   . 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 No clear documentation of this  . Diabetes mellitus   . Neuromuscular disorder     periph neuropathy  . Shortness of breath   . Arthritis   . GERD (gastroesophageal reflux disease)   . Blood transfusion   . COPD (chronic obstructive pulmonary disease)   . Cirrhosis     non aloholic   . Pleural effusion 02/13/2012  . RVH (right ventricular hypertrophy) 02/13/2012  . Anemia 02/13/2012  . Chronic respiratory failure 02/15/2012    With hypoxia and hypercapnia.  . Anasarca 01/2012    Ejection fraction 60-65%    Past Surgical History  Procedure Laterality Date  . Basal cell carcinoma excision    . Abdominal surgery      multiple  . Vesicovaginal fistula closure w/ tah    .  Cancers resected    . Appendectomy    . Abdominal hysterectomy    . Tubal ligation    . Cholecystectomy    . Colon surgery      hx bleeding ulcer  . Mass excision  06/23/2011    Procedure: EXCISION MASS;  Surgeon: Tami Ribas, MD;  Location: [facility];  Service: Orthopedics;  Laterality: Left;  left hand excision mass with acell placement  . Left hand  06/23/11    Removed Squamous cell , and also did graft  . Mass excision  11/02/2011    Procedure: EXCISION MASS;  Surgeon: Tami Ribas, MD;  Location: [facility];  Service: Orthopedics;  Laterality: Left;  EXCISION MASS LEFT HAND with full thickness skin graft from left upper arm  . Colonoscopy with esophagogastroduodenoscopy (egd) N/A 08/01/2012    Procedure: COLONOSCOPY WITH ESOPHAGOGASTRODUODENOSCOPY (EGD);  Surgeon: Malissa Hippo, MD;  [facility];  Service: Endoscopy;  Laterality: N/A;  730  . Balloon dilation N/A 08/01/2012    Procedure: BALLOON DILATION;  Surgeon: Malissa Hippo, MD;  [facility];  Service: Endoscopy;  Laterality: N/A;  Elease Hashimoto dilation N/A 08/01/2012    Procedure: Elease Hashimoto DILATION;  Surgeon: Malissa Hippo, MD;  [facility];  Service: Endoscopy;  Laterality: N/A;  . Savory dilation N/A 08/01/2012    Procedure: SAVORY DILATION;  Surgeon: Malissa Hippo, MD;  [facility];  Service: Endoscopy;  Laterality: N/A;    ROS:  As stated in the HPI and negative for all other systems.  PHYSICAL EXAM BP 122/66  Pulse 60  Ht 4\' 11"  (1.499 m)  Wt 200 lb (90.719 kg)  BMI 40.37 kg/m2 GENERAL:  Well appearing HEENT:  Pupils equal round and reactive, fundi not visualized, oral mucosa unremarkable, dentures NECK:  JVD to jaw at 45 degrees, waveform within normal limits, carotid upstroke brisk and symmetric, no bruits, no thyromegaly LYMPHATICS:  No cervical, inguinal adenopathy LUNGS:  Clear to auscultation bilaterally BACK:  No CVA  tenderness CHEST:  Unremarkable HEART:  PMI not displaced or sustained,S1 and S2 within normal limits, no S3, no clicks, no rubs, no murmurs, irregular ABD:  Flat, positive bowel sounds normal in frequency in pitch, no bruits, no rebound, no guarding, no midline pulsatile mass, no hepatomegaly, no splenomegaly, obese EXT:  2 plus pulses throughout, moderate nonpitting with some pitting edema left greater than right with some improvement over previous, no cyanosis no clubbing SKIN:  No rashes, multiple  skin tags  EKG:  Atrial fibrillation, rate 60, low-voltage in the chest leads, no acute ST-T wave changes.  09/07/2012   ASSESSMENT AND PLAN  ATRIAL FIBRILLATION:  The patient  tolerates this rhythm and rate control and anticoagulation. She is not interested in switching to a NOAC.  We will continue with the meds as listed.  HTN:  The blood pressure is at target. No change in medications is indicated. We will continue with therapeutic lifestyle changes (TLC).  TOBACCO ABUSE:  We again talked about the need to quit smoking although I am not sure that she will ever be able to do this.   DIASTOLIC HF:   She seems to be euvolemic.  At this point, no change in therapy is indicated.  We have reviewed salt and fluid restrictions.  No further cardiovascular testing is indicated.  She is having a BMET soon when she sees [place], [place], MD.  I would recommend checking this three or four time he is as per year with her meds.

## 2012-09-07 NOTE — Patient Instructions (Addendum)
The current medical regimen is effective;  continue present plan and medications.  Follow up in 1 year with Dr Hochrein.  You will receive a letter in the mail 2 months before you are due.  Please call us when you receive this letter to schedule your follow up appointment.  

## 2012-09-15 ENCOUNTER — Ambulatory Visit (INDEPENDENT_AMBULATORY_CARE_PROVIDER_SITE_OTHER): Payer: Medicare Other | Admitting: Family Medicine

## 2012-09-15 ENCOUNTER — Encounter: Payer: Self-pay | Admitting: Family Medicine

## 2012-09-15 VITALS — BP 107/48 | HR 109 | Temp 97.1°F | Ht 58.5 in | Wt 194.0 lb

## 2012-09-15 DIAGNOSIS — Z79899 Other long term (current) drug therapy: Secondary | ICD-10-CM

## 2012-09-15 DIAGNOSIS — I10 Essential (primary) hypertension: Secondary | ICD-10-CM

## 2012-09-15 DIAGNOSIS — K5090 Crohn's disease, unspecified, without complications: Secondary | ICD-10-CM

## 2012-09-15 DIAGNOSIS — E538 Deficiency of other specified B group vitamins: Secondary | ICD-10-CM

## 2012-09-15 DIAGNOSIS — D649 Anemia, unspecified: Secondary | ICD-10-CM

## 2012-09-15 DIAGNOSIS — R5383 Other fatigue: Secondary | ICD-10-CM

## 2012-09-15 DIAGNOSIS — I4891 Unspecified atrial fibrillation: Secondary | ICD-10-CM

## 2012-09-15 DIAGNOSIS — E119 Type 2 diabetes mellitus without complications: Secondary | ICD-10-CM

## 2012-09-15 DIAGNOSIS — E785 Hyperlipidemia, unspecified: Secondary | ICD-10-CM

## 2012-09-15 DIAGNOSIS — E559 Vitamin D deficiency, unspecified: Secondary | ICD-10-CM

## 2012-09-15 LAB — POCT GLYCOSYLATED HEMOGLOBIN (HGB A1C): Hemoglobin A1C: 5.5

## 2012-09-15 LAB — POCT CBC
Granulocyte percent: 66.3 %G (ref 37–80)
HCT, POC: 39.5 % (ref 37.7–47.9)
POC Granulocyte: 3.4 (ref 2–6.9)
Platelet Count, POC: 167 10*3/uL (ref 142–424)
RBC: 5 M/uL (ref 4.04–5.48)
RDW, POC: 21 %
WBC: 5.1 10*3/uL (ref 4.6–10.2)

## 2012-09-15 LAB — HEPATIC FUNCTION PANEL
Bilirubin, Direct: 0.5 mg/dL — ABNORMAL HIGH (ref 0.0–0.3)
Total Bilirubin: 1.2 mg/dL (ref 0.3–1.2)

## 2012-09-15 LAB — BASIC METABOLIC PANEL WITH GFR
CO2: 27 mEq/L (ref 19–32)
Calcium: 9.6 mg/dL (ref 8.4–10.5)
GFR, Est African American: 70 mL/min
Potassium: 4.9 mEq/L (ref 3.5–5.3)
Sodium: 136 mEq/L (ref 135–145)

## 2012-09-15 LAB — THYROID PANEL WITH TSH: T3 Uptake: 51.5 % — ABNORMAL HIGH (ref 22.5–37.0)

## 2012-09-15 NOTE — Patient Instructions (Addendum)
Exercise as much as possible. Be careful not to fall, use sound judgment Daily medications as doing Arrange appointment with Dr. Aram Beecham

## 2012-09-15 NOTE — Progress Notes (Signed)
Subjective:    Patient ID: [name], female    DOB: Dec 18, 1935, 77 y.o.   MRN: [id number]  HPI This patient presents for recheck of multiple medical problems. No one accompanies the patient today.  Patient Active Problem List  Diagnosis  . DM  . OVERWEIGHT/OBESITY  . ANXIETY  . BLINDNESS, LEFT EYE  . HYPERTENSION, UNSPECIFIED  . Atrial fibrillation  . CROHN'S DISEASE  . Edema  . Tobacco abuse  . Cough  . COPD (chronic obstructive pulmonary disease)  . Cirrhosis, nonalcoholic  . Debility  . Hypokalemia  . CAP (community acquired pneumonia)  . Dyspnea  . Venous stasis  . COPD exacerbation  . Hypoalbuminemia  . Anticoagulant long-term use  . Pleural effusion  . RVH (right ventricular hypertrophy)  . Anemia  . Acute respiratory failure with hypercapnia  . Hypoxia  . Chronic respiratory failure    In addition,she got a PT today . She had a colonoscopy by Dr. Karilyn Cota in February. The cardiologist gave her a good report last week. He will recheck her in one year. As of note her daughter helps her a lot at home and taking her to doctor's visits. Her husband has had a stroke recently.  The allergies, current medications, past medical history, surgical history, family and social history are reviewed.  Immunizations reviewed.  Health maintenance reviewed.  The following items are outstanding: none      Review of Systems  Constitutional: Negative.   HENT: Negative.   Eyes: Positive for itching (allergies).  Respiratory: Negative.   Cardiovascular: Positive for leg swelling (some).  Gastrointestinal: Negative.   Genitourinary: Negative.   Musculoskeletal: Positive for arthralgias (arthritis).  Neurological: Negative.   Psychiatric/Behavioral: Negative.    Results for orders placed in visit on 09/15/12  POCT INR      Result Value Range   INR 4.6    POCT CBC      Result Value Range   WBC 5.1  4.6 - 10.2 K/uL   Lymph, poc 1.3  0.6 - 3.4   POC LYMPH  PERCENT 25.7  10 - 50 %L   POC Granulocyte 3.4  2 - 6.9   Granulocyte percent 66.3  37 - 80 %G   RBC 5.0  4.04 - 5.48 M/uL   Hemoglobin 12.7  12.2 - 16.2 g/dL   HCT, POC 40.9  81.1 - 47.9 %   MCV 79.5 (*) 80 - 97 fL   MCH, POC 25.6 (*) 27 - 31.2 pg   MCHC 32.2  31.8 - 35.4 g/dL   RDW, POC 91.4     Platelet Count, POC 167.0  142 - 424 K/uL   MPV 7.4  0 - 99.8 fL  POCT GLYCOSYLATED HEMOGLOBIN (HGB A1C)      Result Value Range   Hemoglobin A1C 5.5          Objective:   Physical Exam  BP 107/48  Pulse 109  Temp(Src) 97.1 F (36.2 C) (Oral)  Ht 4' 10.5" (1.486 m)  Wt 194 lb (87.998 kg)  BMI 39.85 kg/m2  The patient appeared well nourished and normally developed,overweight, alert and oriented to time and place. Speech, behavior and judgement appear normal. Vital signs as documented.  Head exam is unremarkable. No scleral icterus or pallor noted. Nasal congestion bilateral  Neck is without jugular venous distension, thyromegally, or carotid bruits. Carotid upstrokes are brisk bilaterally. No cervical adenopathy. Lungs are clear anteriorly and posteriorly to auscultation. Normal respiratory effort. Breasts checked was  done today and no masses were found. Cardiac exam reveals irregular irregular  rate and rhythm at 84 per minute. First and second heart sounds normal. No murmurs, rubs or gallops.  Abdominal exam reveals normal bowl sounds, no masses, no organomegaly and no aortic enlargement. No inguinal adenopathy. Generalized abdominal tenderness. She has an umbilical hernia that appears to be stable. Extremities are nonedematous and both femoral and pedal pulses are normal. Skin without pallor or jaundice.  Warm and dry, without rash. Multiple seborrheic keratoses over her body. Neurologic exam reveals normal deep tendon reflexes and normal sensation. Diabetic foot exam done today          Assessment & Plan:  1. Anemia - POCT CBC; Standing - POCT CBC  2. Hyperlipemia -  Hepatic function panel; Standing - NMR Lipoprofile with Lipids; Standing - Hepatic function panel - NMR Lipoprofile with Lipids  3. Diabetes - POCT glycosylated hemoglobin (Hb A1C); Standing - POCT UA - Microalbumin; Standing - BASIC METABOLIC PANEL WITH GFR; Standing - POCT glycosylated hemoglobin (Hb A1C) - POCT UA - Microalbumin - BASIC METABOLIC PANEL WITH GFR  4. Essential hypertension, benign - BASIC METABOLIC PANEL WITH GFR; Standing - BASIC METABOLIC PANEL WITH GFR  5. Vitamin D deficiency - Vitamin D 25 hydroxy; Standing - Vitamin D 25 hydroxy  6. Fatigue - POCT CBC; Standing - Thyroid Panel With TSH - POCT CBC  7. A-fib - POCT INR; Standing - POCT INR  8. Vitamin B 12 deficiency  9. Crohn's disease

## 2012-09-15 NOTE — Addendum Note (Signed)
Addended by: Prescott Gum on: 09/15/2012 10:43 AM   Modules accepted: Orders

## 2012-09-16 LAB — NMR LIPOPROFILE WITH LIPIDS
HDL Size: 11 nm (ref >=9.2)
HDL-C: 35 mg/dL — ABNORMAL LOW (ref >=40)
LDL Particle Number: 690 nmol/L (ref <1000)
LP-IR Score: <25 (ref <=45)
Triglycerides: 77 mg/dL (ref <150)

## 2012-09-16 LAB — VITAMIN D 25 HYDROXY (VIT D DEFICIENCY, FRACTURES): Vit D, 25-Hydroxy: 20 ng/mL — ABNORMAL LOW (ref 30–89)

## 2012-09-22 ENCOUNTER — Other Ambulatory Visit: Payer: Self-pay | Admitting: Family Medicine

## 2012-09-29 ENCOUNTER — Ambulatory Visit (INDEPENDENT_AMBULATORY_CARE_PROVIDER_SITE_OTHER): Payer: Medicare Other | Admitting: Pharmacist

## 2012-09-29 VITALS — Wt 196.0 lb

## 2012-09-29 DIAGNOSIS — I4891 Unspecified atrial fibrillation: Secondary | ICD-10-CM

## 2012-09-29 LAB — POCT INR: INR: 3.6

## 2012-09-29 NOTE — Patient Instructions (Signed)
Anticoagulation Dose Instructions as of 09/29/2012     [name] Tue Wed Thu Fri Sat   New Dose 0.5 mg 0.5 mg 1 mg 0.5 mg 1 mg 0.5 mg 0.5 mg    Description       No warfarin today, then start new dose of 1/2 tablet daily except 1 tablet on tuesdays and thursdays       INR was 3.6 today

## 2012-10-04 ENCOUNTER — Telehealth: Payer: Self-pay | Admitting: *Deleted

## 2012-10-04 NOTE — Telephone Encounter (Signed)
Pt notified of results

## 2012-10-04 NOTE — Telephone Encounter (Signed)
Message copied by Bearl Mulberry on Tue Oct 04, 2012 12:44 PM ------      Message from: Ernestina Penna      Created: Fri Sep 16, 2012 10:24 PM       Electrolytes blood sugar and renal function all good and within normal limits      Liver function tests within normal      Vitamin D.. is very low. She should be on 50,000 weekly for 12 weeks and then recheck vitamin D level      The total LDL particle number is good and at goal.  triglycerides are good. The HDL particle number is very low. Only  more exercise will help this. Continue                 good diet habit ------

## 2012-10-12 ENCOUNTER — Other Ambulatory Visit: Payer: Self-pay | Admitting: Cardiology

## 2012-10-19 ENCOUNTER — Ambulatory Visit (INDEPENDENT_AMBULATORY_CARE_PROVIDER_SITE_OTHER): Payer: Medicare Other | Admitting: Pharmacist

## 2012-10-19 DIAGNOSIS — I4891 Unspecified atrial fibrillation: Secondary | ICD-10-CM

## 2012-10-19 NOTE — Patient Instructions (Signed)
Anticoagulation Dose Instructions as of 10/19/2012     [name] Tue Wed Thu Fri Sat   New Dose 0.5 mg 0.5 mg 1 mg 0.5 mg 1 mg 0.5 mg 0.5 mg    Description       Continue same dose of 1/2 tablet daily except 1 tablet on tuesdays and thursdays       INR was 2.9 today

## 2012-11-01 ENCOUNTER — Ambulatory Visit (INDEPENDENT_AMBULATORY_CARE_PROVIDER_SITE_OTHER): Payer: Medicare Other | Admitting: Internal Medicine

## 2012-11-01 ENCOUNTER — Encounter (INDEPENDENT_AMBULATORY_CARE_PROVIDER_SITE_OTHER): Payer: Self-pay | Admitting: Internal Medicine

## 2012-11-01 VITALS — BP 116/68 | HR 70 | Temp 97.9°F | Resp 20 | Ht 59.0 in | Wt 193.1 lb

## 2012-11-01 DIAGNOSIS — K7460 Unspecified cirrhosis of liver: Secondary | ICD-10-CM

## 2012-11-01 DIAGNOSIS — K50919 Crohn's disease, unspecified, with unspecified complications: Secondary | ICD-10-CM

## 2012-11-01 DIAGNOSIS — K5090 Crohn's disease, unspecified, without complications: Secondary | ICD-10-CM

## 2012-11-01 DIAGNOSIS — K219 Gastro-esophageal reflux disease without esophagitis: Secondary | ICD-10-CM

## 2012-11-01 NOTE — Patient Instructions (Signed)
Next blood work will be in August 2014(alpha-fetoprotein).

## 2012-11-01 NOTE — Progress Notes (Signed)
Presenting complaint;  Follow for GERD and Crohn's disease.  Subjective:  Patient is 77 year old Caucasian female who is here for scheduled visit. She had EGD with ED and colonoscopy would be anastomotic and anorectal stricture dilation 3 months ago. She reports resolution of her dysphagia and she rarely experiences heartburn. She has noted increasing caliber of her stool since dilation although still quite thin. She is having 2 bowel movements per day. She denies melena or rectal bleeding. She has lost 13 pounds in the last 4 months. She says most of the weight loss is voluntary. She states she is 3 meals a day and snacks in between but she eats healthier than she used to.  Current Medications: Current Outpatient Prescriptions  Medication Sig Dispense Refill  . acetaminophen (TYLENOL) 500 MG tablet Take 500 mg by mouth every 8 (eight) hours as needed. Pain      . ALPRAZolam (XANAX) 0.5 MG tablet Take 0.25-0.5 mg by mouth 2 (two) times daily. Anxiety      . digoxin (LANOXIN) 0.125 MG tablet Take 1 tablet (0.125 mg total) by mouth daily.  30 tablet  2  . diltiazem (CARDIZEM CD) 180 MG 24 hr capsule TAKE ONE CAPSULE BY MOUTH ONE TIME DAILY  30 capsule  11  . ferrous sulfate 325 (65 FE) MG tablet Take 325 mg by mouth. Patient takes this medication 3 times per week      . furosemide (LASIX) 20 MG tablet Take 1 tablet (20 mg total) by mouth daily. New medication for fluid retention.  30 tablet  20  . lansoprazole (PREVACID) 30 MG capsule Take 30 mg by mouth daily.      Marland Kitchen levalbuterol (XOPENEX HFA) 45 MCG/ACT inhaler Inhale 1-2 puffs into the lungs every 4 (four) hours as needed for wheezing.  1 Inhaler  12  . Mesalamine (ASACOL HD) 800 MG TBEC Take 800 mg by mouth 2 (two) times daily.      . metFORMIN (GLUCOPHAGE) 1000 MG tablet Take 1,000 mg by mouth 2 (two) times daily with a meal.        . Multiple Vitamin (MULTIVITAMIN) tablet Take 1 tablet by mouth daily.        . potassium chloride (KLOR-CON)  10 MEQ CR tablet Take 1 tablet (10 mEq total) by mouth daily. Take with furosemide (Lasix).  30 tablet  2  . spironolactone (ALDACTONE) 50 MG tablet Take 1 tablet (50 mg total) by mouth 2 (two) times daily. New medication for fluid retention.  60 tablet  2  . vitamin C (ASCORBIC ACID) 500 MG tablet Take 500 mg by mouth daily.      . Vitamin D, Ergocalciferol, (DRISDOL) 50000 UNITS CAPS TAKE ONE CAPSULE BY MOUTH WEEKLY  12 capsule  0  . warfarin (COUMADIN) 1 MG tablet Take 1 mg by mouth daily. Tuesday and Thursday patient takes 1 tablet , the other days she states she takes 1/2 tablet       No current facility-administered medications for this visit.     Objective: Blood pressure 116/68, pulse 70, temperature 97.9 F (36.6 C), temperature source Oral, resp. rate 20, height 4\' 11"  (1.499 m), weight 193 lb 1.6 oz (87.59 kg). Patient is alert and does not have asterixis. Conjunctiva is pink. Sclera is nonicteric Oropharyngeal mucosa is normal. No neck masses or thyromegaly noted. Cardiac exam with irregular rhythm normal S1 and S2. No murmur or gallop noted. Lungs are clear to auscultation. Abdomen is full but small umbilicus hernia which  is reducible. Abdomen is soft and nontender without hepatosplenomegaly. Shifting dullness is absent.  She has both pitting and nonpitting pretibial edema. She has multiple keratosis over her face neck and extremities.  Labs/studies Results: AFP 3.3 in February 2014. CT abdomen and pelvis on 07/26/2012. Cirrhotic liver but no suspicious lesions noted; mild splenomegaly with spontaneous spleno renal shunt but no ascites. Lab data from 09/15/2012 WBC 5.1, H&H 12.7 and 39.5 and platelet count 167K. Bilirubin 1.2, AP 101, AST 30, ALT 17, total protein 6.9 and albumin 3.1.  Assessment:  #1. GERD complicated by esophageal stricture. Heartburn is well controlled with therapy and she is able to swallow better since EGD 3 months ago. #2. Crohn's disease  complicated by anastomotic and anorectal stricture status post dilation 3 months ago. She remains on oral mesalamine. #3. Cirrhosis secondary to NAFLD. There is no evidence of progression of her liver disease. Weight reduction should have very favorable impact on her liver disease. No indices identified on recent EGD.    Plan:  Continue current therapy. Alpha-fetoprotein in August 2014. Ultrasound following next visit. Office visit in 6 months.

## 2012-11-07 ENCOUNTER — Ambulatory Visit (INDEPENDENT_AMBULATORY_CARE_PROVIDER_SITE_OTHER): Payer: Medicare Other | Admitting: Physician Assistant

## 2012-11-07 ENCOUNTER — Telehealth: Payer: Self-pay | Admitting: Pharmacist

## 2012-11-07 ENCOUNTER — Encounter: Payer: Self-pay | Admitting: Physician Assistant

## 2012-11-07 VITALS — BP 118/72 | HR 83 | Temp 97.2°F | Ht 59.5 in | Wt 192.4 lb

## 2012-11-07 DIAGNOSIS — R600 Localized edema: Secondary | ICD-10-CM

## 2012-11-07 DIAGNOSIS — R609 Edema, unspecified: Secondary | ICD-10-CM

## 2012-11-07 NOTE — Telephone Encounter (Signed)
appt needed to evaluate edema. Appt made for [name] for 11/07/12 at 12:00 Patient notified

## 2012-11-07 NOTE — Patient Instructions (Signed)

## 2012-11-07 NOTE — Progress Notes (Signed)
Subjective:     Patient ID: [name], female   DOB: [birth year], 77 y.o.   MRN: [id number]  HPI Pt with lower ext edema L>R She has had drainage from the L leg The drainage is clear  No pain to the leg She has had hx of same Pt has TED hose at home but has not worn them for several months She sees Dr Kirtland Bouchard on a regular basis for Afib and ? CHF Pt monitors her weight and states her weight is down    Review of Systems  All other systems reviewed and are negative.       Objective:   Physical Exam  Nursing note and vitals reviewed. ++ pitting edema to both lower legs Ant tib with Derm changes bilat L lower leg with clear d/c from ant tib No post calf pain Good pulses/sensory distal No induration or erythema from draining site Dressing placed today     Assessment:     1. Edema of both legs         Plan:     Stressed the importance of wearing her hose She is already on 3 Lasix a day so not increased today New rx for TED hose Elevation of lower ext Reviewed S/S of infection F/U if sx cont

## 2012-11-11 ENCOUNTER — Other Ambulatory Visit: Payer: Self-pay | Admitting: Family Medicine

## 2012-11-15 ENCOUNTER — Other Ambulatory Visit: Payer: Self-pay | Admitting: Family Medicine

## 2012-11-22 ENCOUNTER — Ambulatory Visit: Payer: Medicare Other | Admitting: Pharmacist Clinician (PhC)/ Clinical Pharmacy Specialist

## 2012-11-22 ENCOUNTER — Encounter: Payer: Self-pay | Admitting: Family Medicine

## 2012-11-22 ENCOUNTER — Ambulatory Visit (INDEPENDENT_AMBULATORY_CARE_PROVIDER_SITE_OTHER): Payer: Medicare Other | Admitting: Family Medicine

## 2012-11-22 VITALS — BP 110/64 | HR 84 | Temp 97.4°F | Ht 59.5 in | Wt 194.0 lb

## 2012-11-22 DIAGNOSIS — E785 Hyperlipidemia, unspecified: Secondary | ICD-10-CM

## 2012-11-22 DIAGNOSIS — K5090 Crohn's disease, unspecified, without complications: Secondary | ICD-10-CM

## 2012-11-22 DIAGNOSIS — I4891 Unspecified atrial fibrillation: Secondary | ICD-10-CM

## 2012-11-22 DIAGNOSIS — E559 Vitamin D deficiency, unspecified: Secondary | ICD-10-CM

## 2012-11-22 DIAGNOSIS — D649 Anemia, unspecified: Secondary | ICD-10-CM

## 2012-11-22 DIAGNOSIS — Z79899 Other long term (current) drug therapy: Secondary | ICD-10-CM

## 2012-11-22 DIAGNOSIS — I10 Essential (primary) hypertension: Secondary | ICD-10-CM

## 2012-11-22 DIAGNOSIS — K7460 Unspecified cirrhosis of liver: Secondary | ICD-10-CM

## 2012-11-22 DIAGNOSIS — R5381 Other malaise: Secondary | ICD-10-CM

## 2012-11-22 DIAGNOSIS — R5383 Other fatigue: Secondary | ICD-10-CM

## 2012-11-22 DIAGNOSIS — R609 Edema, unspecified: Secondary | ICD-10-CM

## 2012-11-22 DIAGNOSIS — E119 Type 2 diabetes mellitus without complications: Secondary | ICD-10-CM

## 2012-11-22 LAB — POCT INR: INR: 2.4

## 2012-11-22 LAB — POCT CBC
HCT, POC: 36.2 % — AB (ref 37.7–47.9)
Hemoglobin: 12.4 g/dL (ref 12.2–16.2)
MCHC: 34.3 g/dL (ref 31.8–35.4)
MCV: 83.2 fL (ref 80–97)
POC Granulocyte: 3.6 (ref 2–6.9)

## 2012-11-22 LAB — HEPATIC FUNCTION PANEL
AST: 22 U/L (ref 0–37)
Bilirubin, Direct: 0.3 mg/dL (ref 0.0–0.3)
Total Bilirubin: 1 mg/dL (ref 0.3–1.2)

## 2012-11-22 LAB — POCT GLYCOSYLATED HEMOGLOBIN (HGB A1C): Hemoglobin A1C: 5.4

## 2012-11-22 NOTE — Addendum Note (Signed)
Addended by: Lisbeth Ply C on: 11/22/2012 11:47 AM   Modules accepted: Orders

## 2012-11-22 NOTE — Patient Instructions (Addendum)
Fall precautions discussed Continue current meds and therapeutic lifestyle changes 

## 2012-11-22 NOTE — Progress Notes (Signed)
  Subjective:    Patient ID: [name], female    DOB: 1935/11/27, 77 y.o.   MRN: [id number]  HPI Patient in for followup of chronic medical problems. These include hypertension hyperlipidemia diabetes atrial fibrillation and anemia. She sees Dr. Karilyn Cota about every 6 months and Dr.dHochrein once yearly. She is followed by the clinical pharmacist for her pro times.   Review of Systems  Constitutional: Positive for fatigue.  HENT: Negative.   Eyes: Negative.   Respiratory: Negative.   Cardiovascular: Positive for leg swelling. Negative for chest pain.  Gastrointestinal: Positive for diarrhea (intermitent). Negative for abdominal pain.  Genitourinary: Positive for frequency (due to med). Negative for dysuria.  Musculoskeletal: Positive for arthralgias (arthritis).  Skin: Negative.   Allergic/Immunologic: Negative.   Neurological: Negative.   Psychiatric/Behavioral: Negative.        Objective:   Physical Exam BP 110/64  Pulse 84  Temp(Src) 97.4 F (36.3 C) (Oral)  Ht 4' 11.5" (1.511 m)  Wt 194 lb (87.998 kg)  BMI 38.54 kg/m2  The patient appeared well nourished and normally developed, alert and oriented to time and place. She is elderly but, even though she said her weight a long time today.  Speech, behavior and judgement appear normal. Vital signs as documented.  Head exam is unremarkable. No scleral icterus or pallor noted. The ears nose and throat were all within normal limits Neck is without jugular venous distension, thyromegally, or carotid bruits. Carotid upstrokes are brisk bilaterally. No cervical adenopathy. Lungs are clear anteriorly and posteriorly to auscultation. Normal respiratory effort. Cardiac exam reveals irregular irregular rate and rhythm at 84 per minute. First and second heart sounds normal.  No murmurs, rubs or gallops.  Abdominal exam reveals normal bowl sounds, no masses, no organomegaly and no aortic enlargement. No inguinal adenopathy. There is  slight tenderness in the left upper quadrant. Extremities are nonedematous and non-weeping today. There is bilateral leg fullness but not pitting edema Skin without pallor or jaundice.  Warm and dry, without rash. She has an extensive number of multiple seborrheic keratoses all over her body. Neurologic exam reveals normal deep tendon reflexes and normal sensation.          Assessment & Plan:  1. CROHN'S DISEASE  2. DM  3. Edema  4. HYPERTENSION, UNSPECIFIED  5. High risk medication use  6. Atrial fibrillation  Labs will be drawn today he rather than her having to come back and do these at a later time. She ate about 3-1/2 hours ago.  Patient Instructions  Fall precautions discussed Continue current meds and therapeutic lifestyle changes    Nyra Capes MD

## 2012-11-23 LAB — BASIC METABOLIC PANEL WITH GFR
CO2: 25 mEq/L (ref 19–32)
Calcium: 9.5 mg/dL (ref 8.4–10.5)
Chloride: 104 mEq/L (ref 96–112)
Potassium: 5.4 mEq/L — ABNORMAL HIGH (ref 3.5–5.3)
Sodium: 136 mEq/L (ref 135–145)

## 2012-11-23 LAB — VITAMIN D 25 HYDROXY (VIT D DEFICIENCY, FRACTURES): Vit D, 25-Hydroxy: 23 ng/mL — ABNORMAL LOW (ref 30–89)

## 2012-11-24 LAB — NMR LIPOPROFILE WITH LIPIDS
Cholesterol, Total: 87 mg/dL (ref <200)
LDL Particle Number: 616 nmol/L (ref <1000)
LP-IR Score: <25 (ref <=45)
Large VLDL-P: <0.8 nmol/L (ref <=2.7)
Triglycerides: 62 mg/dL (ref <150)
VLDL Size: 41.7 nm (ref <=46.6)

## 2012-12-13 ENCOUNTER — Other Ambulatory Visit: Payer: Self-pay | Admitting: Family Medicine

## 2012-12-14 ENCOUNTER — Telehealth: Payer: Self-pay | Admitting: *Deleted

## 2012-12-14 NOTE — Telephone Encounter (Signed)
Pt notified of results Will come in for repeat BMP 

## 2012-12-14 NOTE — Telephone Encounter (Signed)
Message copied by Bearl Mulberry on Wed Dec 14, 2012  7:30 PM ------      Message from: Ernestina Penna      Created: Tue Nov 22, 2012  1:58 PM       The CBC had a normal white blood cell count. The hemoglobin was good at 12.4. Platelet count was adequate.      The hemoglobin A1c showed excellent average blood sugar control at 5.4% ------

## 2012-12-16 ENCOUNTER — Other Ambulatory Visit: Payer: Self-pay | Admitting: Family Medicine

## 2012-12-27 ENCOUNTER — Encounter: Payer: Self-pay | Admitting: Pharmacist Clinician (PhC)/ Clinical Pharmacy Specialist

## 2012-12-27 ENCOUNTER — Telehealth: Payer: Self-pay | Admitting: Pharmacist Clinician (PhC)/ Clinical Pharmacy Specialist

## 2012-12-27 NOTE — Telephone Encounter (Signed)
Ask triage to re-schedule appointment

## 2012-12-27 NOTE — Telephone Encounter (Signed)
appt made

## 2012-12-29 ENCOUNTER — Telehealth (INDEPENDENT_AMBULATORY_CARE_PROVIDER_SITE_OTHER): Payer: Self-pay | Admitting: *Deleted

## 2012-12-29 ENCOUNTER — Encounter (INDEPENDENT_AMBULATORY_CARE_PROVIDER_SITE_OTHER): Payer: Self-pay | Admitting: *Deleted

## 2012-12-29 DIAGNOSIS — K5010 Crohn's disease of large intestine without complications: Secondary | ICD-10-CM

## 2012-12-29 NOTE — Telephone Encounter (Signed)
Per Dr.Rehman the patient will need to have labs drawn in August.

## 2013-01-02 ENCOUNTER — Other Ambulatory Visit: Payer: Self-pay | Admitting: *Deleted

## 2013-01-02 ENCOUNTER — Ambulatory Visit (INDEPENDENT_AMBULATORY_CARE_PROVIDER_SITE_OTHER): Payer: Medicare Other | Admitting: Pharmacist

## 2013-01-02 DIAGNOSIS — F411 Generalized anxiety disorder: Secondary | ICD-10-CM

## 2013-01-02 DIAGNOSIS — I4891 Unspecified atrial fibrillation: Secondary | ICD-10-CM

## 2013-01-02 LAB — POCT INR: INR: 2.6

## 2013-01-02 MED ORDER — ALPRAZOLAM 0.5 MG PO TABS: 0.2500 mg | ORAL_TABLET | Freq: Two times a day (BID) | ORAL | Status: DC

## 2013-01-02 NOTE — Telephone Encounter (Signed)
This is okay.

## 2013-01-02 NOTE — Telephone Encounter (Signed)
Patient last seen in office on 11-22-12. Rx last filled on 11-29-12. Please advise. If approved please route to Pool A so nurse can call in to [facility] pharmacy. Christell Constant)

## 2013-01-02 NOTE — Patient Instructions (Addendum)
Anticoagulation Dose Instructions as of 01/02/2013     [name] Tue Wed Thu Fri Sat   New Dose 0.5 mg 0.5 mg 1 mg 0.5 mg 1 mg 0.5 mg 0.5 mg    Description       Continue same dose of 1/2 tablet daily except 1 tablet on tuesdays and thursdays       INR was 2.6 today

## 2013-01-03 ENCOUNTER — Encounter (INDEPENDENT_AMBULATORY_CARE_PROVIDER_SITE_OTHER): Payer: Self-pay | Admitting: *Deleted

## 2013-01-09 ENCOUNTER — Encounter (INDEPENDENT_AMBULATORY_CARE_PROVIDER_SITE_OTHER): Payer: Medicare Other | Admitting: Internal Medicine

## 2013-01-09 NOTE — Progress Notes (Signed)
This encounter was created in error - please disregard.

## 2013-01-11 ENCOUNTER — Other Ambulatory Visit: Payer: Self-pay | Admitting: Family Medicine

## 2013-01-16 ENCOUNTER — Other Ambulatory Visit: Payer: Self-pay | Admitting: Family Medicine

## 2013-01-16 NOTE — Telephone Encounter (Signed)
dwm to address 

## 2013-01-16 NOTE — Telephone Encounter (Signed)
Last seen 06/15/12  DWM

## 2013-01-17 ENCOUNTER — Ambulatory Visit (HOSPITAL_COMMUNITY): Payer: Medicare Other

## 2013-01-19 ENCOUNTER — Ambulatory Visit (HOSPITAL_COMMUNITY)
Admission: RE | Admit: 2013-01-19 | Discharge: 2013-01-19 | Disposition: A | Discharge status: Home / Self Care | Payer: Medicare Other | Source: Ambulatory Visit | Attending: Internal Medicine | Admitting: Internal Medicine

## 2013-01-19 DIAGNOSIS — R161 Splenomegaly, not elsewhere classified: Secondary | ICD-10-CM | POA: Insufficient documentation

## 2013-01-19 DIAGNOSIS — K7460 Unspecified cirrhosis of liver: Secondary | ICD-10-CM

## 2013-02-10 ENCOUNTER — Other Ambulatory Visit: Payer: Self-pay | Admitting: Family Medicine

## 2013-02-13 ENCOUNTER — Ambulatory Visit (INDEPENDENT_AMBULATORY_CARE_PROVIDER_SITE_OTHER): Payer: Medicare Other | Admitting: Pharmacist

## 2013-02-13 DIAGNOSIS — I4891 Unspecified atrial fibrillation: Secondary | ICD-10-CM

## 2013-02-13 NOTE — Patient Instructions (Signed)
Anticoagulation Dose Instructions as of 02/13/2013     [name] Tue Wed Thu Fri Sat   New Dose 0.5 mg 0.5 mg 1 mg 0.5 mg 1 mg 0.5 mg 0.5 mg    Description       Continue same dose of 1/2 tablet daily except 1 tablet on tuesdays and thursdays      INR was 2.5 today

## 2013-02-18 ENCOUNTER — Other Ambulatory Visit: Payer: Self-pay | Admitting: Family Medicine

## 2013-02-22 ENCOUNTER — Other Ambulatory Visit: Payer: Self-pay | Admitting: *Deleted

## 2013-02-22 MED ORDER — FUROSEMIDE 20 MG PO TABS: 20.0000 mg | ORAL_TABLET | Freq: Every day | ORAL | Status: DC

## 2013-02-22 NOTE — Telephone Encounter (Signed)
LAST OV 6/14

## 2013-03-04 ENCOUNTER — Inpatient Hospital Stay (HOSPITAL_COMMUNITY)
Admission: EM | Admit: 2013-03-04 | Discharge: 2013-03-07 | DRG: 386 | Disposition: A | Discharge status: Home / Self Care | Payer: Medicare Other | Attending: Internal Medicine | Admitting: Internal Medicine

## 2013-03-04 ENCOUNTER — Encounter (HOSPITAL_COMMUNITY): Payer: Self-pay | Admitting: *Deleted

## 2013-03-04 ENCOUNTER — Emergency Department (HOSPITAL_COMMUNITY): Payer: Medicare Other

## 2013-03-04 DIAGNOSIS — F17200 Nicotine dependence, unspecified, uncomplicated: Secondary | ICD-10-CM | POA: Diagnosis present

## 2013-03-04 DIAGNOSIS — R059 Cough, unspecified: Secondary | ICD-10-CM

## 2013-03-04 DIAGNOSIS — K56609 Unspecified intestinal obstruction, unspecified as to partial versus complete obstruction: Secondary | ICD-10-CM

## 2013-03-04 DIAGNOSIS — I10 Essential (primary) hypertension: Secondary | ICD-10-CM

## 2013-03-04 DIAGNOSIS — Z8249 Family history of ischemic heart disease and other diseases of the circulatory system: Secondary | ICD-10-CM

## 2013-03-04 DIAGNOSIS — Z720 Tobacco use: Secondary | ICD-10-CM

## 2013-03-04 DIAGNOSIS — Z806 Family history of leukemia: Secondary | ICD-10-CM

## 2013-03-04 DIAGNOSIS — R05 Cough: Secondary | ICD-10-CM

## 2013-03-04 DIAGNOSIS — E8809 Other disorders of plasma-protein metabolism, not elsewhere classified: Secondary | ICD-10-CM

## 2013-03-04 DIAGNOSIS — Z9119 Patient's noncompliance with other medical treatment and regimen: Secondary | ICD-10-CM

## 2013-03-04 DIAGNOSIS — K5669 Other intestinal obstruction: Secondary | ICD-10-CM | POA: Diagnosis present

## 2013-03-04 DIAGNOSIS — D649 Anemia, unspecified: Secondary | ICD-10-CM

## 2013-03-04 DIAGNOSIS — Z79899 Other long term (current) drug therapy: Secondary | ICD-10-CM

## 2013-03-04 DIAGNOSIS — K5000 Crohn's disease of small intestine without complications: Principal | ICD-10-CM | POA: Diagnosis present

## 2013-03-04 DIAGNOSIS — M129 Arthropathy, unspecified: Secondary | ICD-10-CM | POA: Diagnosis present

## 2013-03-04 DIAGNOSIS — E663 Overweight: Secondary | ICD-10-CM

## 2013-03-04 DIAGNOSIS — J9602 Acute respiratory failure with hypercapnia: Secondary | ICD-10-CM

## 2013-03-04 DIAGNOSIS — R0600 Dyspnea, unspecified: Secondary | ICD-10-CM

## 2013-03-04 DIAGNOSIS — Z7901 Long term (current) use of anticoagulants: Secondary | ICD-10-CM

## 2013-03-04 DIAGNOSIS — J441 Chronic obstructive pulmonary disease with (acute) exacerbation: Secondary | ICD-10-CM

## 2013-03-04 DIAGNOSIS — J4489 Other specified chronic obstructive pulmonary disease: Secondary | ICD-10-CM | POA: Diagnosis present

## 2013-03-04 DIAGNOSIS — G609 Hereditary and idiopathic neuropathy, unspecified: Secondary | ICD-10-CM | POA: Diagnosis present

## 2013-03-04 DIAGNOSIS — R5381 Other malaise: Secondary | ICD-10-CM

## 2013-03-04 DIAGNOSIS — E876 Hypokalemia: Secondary | ICD-10-CM

## 2013-03-04 DIAGNOSIS — Z800 Family history of malignant neoplasm of digestive organs: Secondary | ICD-10-CM

## 2013-03-04 DIAGNOSIS — J90 Pleural effusion, not elsewhere classified: Secondary | ICD-10-CM

## 2013-03-04 DIAGNOSIS — Z85828 Personal history of other malignant neoplasm of skin: Secondary | ICD-10-CM

## 2013-03-04 DIAGNOSIS — E119 Type 2 diabetes mellitus without complications: Secondary | ICD-10-CM

## 2013-03-04 DIAGNOSIS — J9610 Chronic respiratory failure, unspecified whether with hypoxia or hypercapnia: Secondary | ICD-10-CM

## 2013-03-04 DIAGNOSIS — Z91199 Patient's noncompliance with other medical treatment and regimen due to unspecified reason: Secondary | ICD-10-CM

## 2013-03-04 DIAGNOSIS — K429 Umbilical hernia without obstruction or gangrene: Secondary | ICD-10-CM | POA: Diagnosis present

## 2013-03-04 DIAGNOSIS — I4891 Unspecified atrial fibrillation: Secondary | ICD-10-CM

## 2013-03-04 DIAGNOSIS — K219 Gastro-esophageal reflux disease without esophagitis: Secondary | ICD-10-CM | POA: Diagnosis present

## 2013-03-04 DIAGNOSIS — Z6838 Body mass index (BMI) 38.0-38.9, adult: Secondary | ICD-10-CM

## 2013-03-04 DIAGNOSIS — H5440 Blindness, one eye, unspecified eye: Secondary | ICD-10-CM

## 2013-03-04 DIAGNOSIS — J189 Pneumonia, unspecified organism: Secondary | ICD-10-CM

## 2013-03-04 DIAGNOSIS — F411 Generalized anxiety disorder: Secondary | ICD-10-CM

## 2013-03-04 DIAGNOSIS — I517 Cardiomegaly: Secondary | ICD-10-CM

## 2013-03-04 DIAGNOSIS — R0902 Hypoxemia: Secondary | ICD-10-CM

## 2013-03-04 DIAGNOSIS — K5090 Crohn's disease, unspecified, without complications: Secondary | ICD-10-CM

## 2013-03-04 DIAGNOSIS — J449 Chronic obstructive pulmonary disease, unspecified: Secondary | ICD-10-CM

## 2013-03-04 DIAGNOSIS — R609 Edema, unspecified: Secondary | ICD-10-CM

## 2013-03-04 DIAGNOSIS — I509 Heart failure, unspecified: Secondary | ICD-10-CM | POA: Diagnosis present

## 2013-03-04 DIAGNOSIS — K7460 Unspecified cirrhosis of liver: Secondary | ICD-10-CM

## 2013-03-04 DIAGNOSIS — I878 Other specified disorders of veins: Secondary | ICD-10-CM

## 2013-03-04 HISTORY — DX: Heart failure, unspecified: I50.9

## 2013-03-04 LAB — COMPREHENSIVE METABOLIC PANEL
ALT: 16 U/L (ref 0–35)
CO2: 22 mEq/L (ref 19–32)
Calcium: 10.1 mg/dL (ref 8.4–10.5)
Chloride: 92 mEq/L — ABNORMAL LOW (ref 96–112)
Creatinine, Ser: 1 mg/dL (ref 0.50–1.10)
GFR calc Af Amer: 62 mL/min — ABNORMAL LOW (ref >90)
GFR calc non Af Amer: 54 mL/min — ABNORMAL LOW (ref >90)
Glucose, Bld: 121 mg/dL — ABNORMAL HIGH (ref 70–99)
Total Bilirubin: 1.5 mg/dL — ABNORMAL HIGH (ref 0.3–1.2)

## 2013-03-04 LAB — CBC WITH DIFFERENTIAL/PLATELET
Eosinophils Relative: 1 % (ref 0–5)
HCT: 36.8 % (ref 36.0–46.0)
Hemoglobin: 12.2 g/dL (ref 12.0–15.0)
Lymphocytes Relative: 11 % — ABNORMAL LOW (ref 12–46)
Lymphs Abs: 0.8 10*3/uL (ref 0.7–4.0)
MCV: 84.4 fL (ref 78.0–100.0)
Monocytes Absolute: 0.8 10*3/uL (ref 0.1–1.0)
RBC: 4.36 MIL/uL (ref 3.87–5.11)
WBC: 7.6 10*3/uL (ref 4.0–10.5)

## 2013-03-04 LAB — URINALYSIS, ROUTINE W REFLEX MICROSCOPIC
Hgb urine dipstick: NEGATIVE
Ketones, ur: NEGATIVE mg/dL
Protein, ur: NEGATIVE mg/dL
Urobilinogen, UA: 0.2 mg/dL (ref 0.0–1.0)

## 2013-03-04 MED ORDER — MORPHINE SULFATE 2 MG/ML IJ SOLN
2.0000 mg | Freq: Once | INTRAMUSCULAR | Status: DC
  Filled 2013-03-04: qty 1

## 2013-03-04 MED ORDER — MORPHINE SULFATE 2 MG/ML IJ SOLN
2.0000 mg | Freq: Once | INTRAMUSCULAR | Status: AC
  Administered 2013-03-04: 2 mg via INTRAVENOUS
  Filled 2013-03-04: qty 1

## 2013-03-04 MED ORDER — ONDANSETRON HCL 4 MG/2ML IJ SOLN
4.0000 mg | Freq: Once | INTRAMUSCULAR | Status: AC
  Administered 2013-03-04: 4 mg via INTRAVENOUS

## 2013-03-04 MED ORDER — IOHEXOL 300 MG/ML  SOLN
100.0000 mL | Freq: Once | INTRAMUSCULAR | Status: AC | PRN
  Administered 2013-03-04: 100 mL via INTRAVENOUS

## 2013-03-04 MED ORDER — ONDANSETRON HCL 4 MG/2ML IJ SOLN
4.0000 mg | Freq: Once | INTRAMUSCULAR | Status: DC
  Filled 2013-03-04: qty 2

## 2013-03-04 MED ORDER — METRONIDAZOLE IN NACL 5-0.79 MG/ML-% IV SOLN
500.0000 mg | Freq: Once | INTRAVENOUS | Status: DC
  Filled 2013-03-04: qty 100

## 2013-03-04 MED ORDER — SODIUM CHLORIDE 0.9 % IV BOLUS (SEPSIS): 500.0000 mL | Freq: Once | INTRAVENOUS | Status: DC

## 2013-03-04 MED ORDER — IOHEXOL 300 MG/ML  SOLN
50.0000 mL | Freq: Once | INTRAMUSCULAR | Status: AC | PRN
  Administered 2013-03-04: 50 mL via ORAL

## 2013-03-04 MED ORDER — ONDANSETRON HCL 4 MG/2ML IJ SOLN
INTRAMUSCULAR | Status: AC
  Administered 2013-03-04: 4 mg
  Filled 2013-03-04: qty 2

## 2013-03-04 MED ORDER — CIPROFLOXACIN IN D5W 400 MG/200ML IV SOLN
400.0000 mg | Freq: Once | INTRAVENOUS | Status: AC
  Administered 2013-03-05: 400 mg via INTRAVENOUS
  Filled 2013-03-04: qty 200

## 2013-03-04 MED ORDER — MORPHINE SULFATE 2 MG/ML IJ SOLN
2.0000 mg | Freq: Once | INTRAMUSCULAR | Status: AC
  Administered 2013-03-04: 2 mg via INTRAVENOUS

## 2013-03-04 NOTE — ED Notes (Signed)
Cramping pain intermittently in lower abdomen began last night. Nauseated but denies vomiting or diarrhea.  Last BM at 1330.  Hx of Chron's.  Has to ocasionally have "stretching of rectum" by Dr. Minus [facility].

## 2013-03-04 NOTE — ED Provider Notes (Signed)
CSN: [id number]     Arrival date & time 03/04/13  1741 History  This chart was scribed for Donnetta Hutching, MD by Ardelia Mems, ED Scribe. This patient was seen in room [facility]18[facility]18 and the patient's care was started at 6:00 PM.    Chief Complaint  Patient presents with  . Abdominal Pain    The history is provided by the patient. No language interpreter was used.    HPI Comments: [name] is a 77 y.o. female with a history of Crohn's disease, HTN and DM who presents to the Emergency Department complaining of intermittent, moderate central and bilateral abdominal pain onset yesterday at about 2:30 PM (about 30 hours ago). She describes her pain as "cramping". She reports associated nausea and dry-heeving. She states that she has a decreased appetite over the past 2 days, and that she has only had a bowel of soup today. She states that she had a normal BM about 4 hours ago, which did not relieve her abdominal pain. She states that she has a history of 4 or 5 blockages in her GI tract which have required surgical intervention, a cholecystectomy and 2 C-sections. She also states that she has seen Dr. Minus Liberty in the past to have her rectum dilated. She denies emesis, diarrhea or any other symptoms. Pt is a current every day smoker of 0.5 packs/day of 40 years.   PCP- Dr. Rudi Heap, in [place]   Past Medical History  Diagnosis Date  . Hypertension     x 5 years  . Paroxysmal atrial fibrillation   . Anxiety   . Crohn's disease   . 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 No clear documentation of this  . Diabetes mellitus   . Neuromuscular disorder     periph neuropathy  . Shortness of breath   . Arthritis   . GERD (gastroesophageal reflux disease)   . Blood transfusion   . COPD (chronic obstructive pulmonary disease)   . Cirrhosis     non aloholic   . Pleural effusion 02/13/2012  . RVH (right ventricular hypertrophy) 02/13/2012  . Anemia 02/13/2012  . Chronic  respiratory failure 02/15/2012    With hypoxia and hypercapnia.  . Anasarca 01/2012    Ejection fraction 60-65%  . CHF (congestive heart failure)    Past Surgical History  Procedure Laterality Date  . Basal cell carcinoma excision    . Abdominal surgery      multiple  . Vesicovaginal fistula closure w/ tah    . Cancers resected    . Appendectomy    . Abdominal hysterectomy    . Tubal ligation    . Cholecystectomy    . Colon surgery      hx bleeding ulcer  . Mass excision  06/23/2011    Procedure: EXCISION MASS;  Surgeon: Tami Ribas, MD;  Location: [facility];  Service: Orthopedics;  Laterality: Left;  left hand excision mass with acell placement  . Left hand  06/23/11    Removed Squamous cell , and also did graft  . Mass excision  11/02/2011    Procedure: EXCISION MASS;  Surgeon: Tami Ribas, MD;  Location: [facility];  Service: Orthopedics;  Laterality: Left;  EXCISION MASS LEFT HAND with full thickness skin graft from left upper arm  . Colonoscopy with esophagogastroduodenoscopy (egd) N/A 08/01/2012    Procedure: COLONOSCOPY WITH ESOPHAGOGASTRODUODENOSCOPY (EGD);  Surgeon: Malissa Hippo, MD;  [facility];  Service: Endoscopy;  Laterality: N/A;  730  . Balloon dilation N/A 08/01/2012    Procedure: BALLOON DILATION;  Surgeon: Malissa Hippo, MD;  Location: [facility];  Service: Endoscopy;  Laterality: N/A;  Elease Hashimoto dilation N/A 08/01/2012    Procedure: Elease Hashimoto DILATION;  Surgeon: Malissa Hippo, MD;  Location: [facility];  Service: Endoscopy;  Laterality: N/A;  . Savory dilation N/A 08/01/2012    Procedure: SAVORY DILATION;  Surgeon: Malissa Hippo, MD;  Location: [facility];  Service: Endoscopy;  Laterality: N/A;   Family History  Problem Relation Age of Onset  . Coronary artery disease Father   . Heart attack Father   . Coronary artery disease Mother   . Coronary artery disease Brother   . Heart attack Brother   . Pancreatic  cancer Brother   . Heart attack Brother   . Leukemia Daughter   . Healthy Daughter   . Colon cancer Neg Hx    History  Substance Use Topics  . Smoking status: Current Every Day Smoker -- 0.50 packs/day for 40 years    Types: Cigarettes    Start date: 06/02/1963  . Smokeless tobacco: Never Used  . Alcohol Use: No   OB History   Grav Para Term Preterm Abortions TAB SAB Ect Mult Living                 Review of Systems A complete 10 system review of systems was obtained and all systems are negative except as noted in the HPI and PMH.   Allergies  Sulfonamide derivatives  Home Medications   Current Outpatient Rx  Name  Route  Sig  Dispense  Refill  . acetaminophen (TYLENOL) 500 MG tablet   Oral   Take 500 mg by mouth every 8 (eight) hours as needed. Pain         . ALPRAZolam (XANAX) 0.25 MG tablet   Oral   Take 0.375 mg by mouth every morning. Takes one and one-half tablet in the morning         . diltiazem (CARDIZEM CD) 180 MG 24 hr capsule   Oral   Take 180 mg by mouth daily.         . ferrous sulfate 325 (65 FE) MG tablet   Oral   Take 325 mg by mouth. Patient takes this medication 3 times per week         . furosemide (LASIX) 20 MG tablet   Oral   Take 1 tablet (20 mg total) by mouth daily. New medication for fluid retention.   30 tablet   3   . gabapentin (NEURONTIN) 100 MG capsule   Oral   Take 100 mg by mouth at bedtime.         . lansoprazole (PREVACID) 30 MG capsule   Oral   Take 30 mg by mouth daily as needed (for acid reflux).          Marland Kitchen levalbuterol (XOPENEX HFA) 45 MCG/ACT inhaler   Inhalation   Inhale 1-2 puffs into the lungs every 4 (four) hours as needed for wheezing.   1 Inhaler   12   . Mesalamine (ASACOL HD) 800 MG TBEC   Oral   Take 800 mg by mouth daily.          . metFORMIN (GLUCOPHAGE) 1000 MG tablet   Oral   Take 1,000 mg by mouth 2 (two) times daily.         Marland Kitchen  Multiple Vitamin (MULTIVITAMIN) tablet   Oral    Take 1 tablet by mouth daily.           . potassium chloride (K-DUR) 10 MEQ tablet   Oral   Take 10 mEq by mouth every morning.         Marland Kitchen spironolactone (ALDACTONE) 50 MG tablet   Oral   Take 50 mg by mouth 2 (two) times daily.         . vitamin C (ASCORBIC ACID) 500 MG tablet   Oral   Take 500 mg by mouth daily.         . Vitamin D, Ergocalciferol, (DRISDOL) 50000 UNITS CAPS capsule   Oral   Take 50,000 Units by mouth every Monday.         . warfarin (COUMADIN) 1 MG tablet   Oral   Take 1 mg by mouth See admin instructions. Tuesday and Thursday patient takes 1 tablet , the other days she states she takes 1/2 tablet          Triage Vitals: BP 128/81  Pulse 116  Temp(Src) 98 F (36.7 C) (Oral)  Resp 20  Ht 5' (1.524 m)  Wt 196 lb (88.905 kg)  BMI 38.28 kg/m2  SpO2 100%  Physical Exam  Nursing note and vitals reviewed. Constitutional: She is oriented to person, place, and time. She appears well-developed and well-nourished.  HENT:  Head: Normocephalic and atraumatic.  Eyes: Conjunctivae and EOM are normal. Pupils are equal, round, and reactive to light.  Neck: Normal range of motion. Neck supple.  Cardiovascular: Normal rate, regular rhythm and normal heart sounds.   Pulmonary/Chest: Effort normal and breath sounds normal.  Abdominal: Soft. Bowel sounds are normal. She exhibits distension. There is tenderness (mild, diffusely).  Musculoskeletal: Normal range of motion. She exhibits edema.  Bilateral legs have 3+ edema ("normal" for her).  Neurological: She is alert and oriented to person, place, and time.  Skin: Skin is warm and dry.  Psychiatric: She has a normal mood and affect.    ED Course  Procedures (including critical care time)  DIAGNOSTIC STUDIES: Oxygen Saturation is 100% on RA, normal by my interpretation.    COORDINATION OF CARE: 6:07 PM- Discussed clinical suspicion that pt may have a blockage in her GI tract, given her symptoms,  surgical history and past history of blockages. Discussed plan for pt to receive fluids, morphine and Zofran in the ED. Also discussed plan to obtain an X-ray of pt's abdomen and diagnostic blood work. Discussed plan for possible admission. Pt advised of plan for treatment and pt agrees.  Medications  sodium chloride 0.9 % bolus 500 mL (not administered)  ondansetron (ZOFRAN) injection 4 mg (not administered)  morphine 2 MG/ML injection 2 mg (not administered)  ondansetron (ZOFRAN) injection 4 mg (not administered)  morphine 2 MG/ML injection 2 mg (2 mg Intravenous Given 03/04/13 2114)  morphine 2 MG/ML injection 2 mg (2 mg Intravenous Given 03/04/13 1854)  ondansetron (ZOFRAN) injection 4 mg (4 mg Intravenous Given 03/04/13 1854)  ondansetron (ZOFRAN) 4 MG/2ML injection (4 mg  Given 03/04/13 2114)  ondansetron (ZOFRAN) 4 MG/2ML injection (4 mg  Given 03/04/13 2210)  iohexol (OMNIPAQUE) 300 MG/ML solution 50 mL (50 mLs Oral Contrast Given 03/04/13 2303)  iohexol (OMNIPAQUE) 300 MG/ML solution 100 mL (100 mLs Intravenous Contrast Given 03/04/13 2304)    Labs Review Labs Reviewed  CBC WITH DIFFERENTIAL - Abnormal; Notable for the following:    RDW 16.9 (*)  Neutrophils Relative % 78 (*)    Lymphocytes Relative 11 (*)    All other components within normal limits  COMPREHENSIVE METABOLIC PANEL - Abnormal; Notable for the following:    Sodium 126 (*)    Chloride 92 (*)    Glucose, Bld 121 (*)    Albumin 2.9 (*)    Total Bilirubin 1.5 (*)    GFR calc non Af Amer 54 (*)    GFR calc Af Amer 62 (*)    All other components within normal limits  LIPASE, BLOOD - Abnormal; Notable for the following:    Lipase 68 (*)    All other components within normal limits  URINALYSIS, ROUTINE W REFLEX MICROSCOPIC   Imaging Review Ct Abdomen Pelvis W Contrast  03/04/2013   CLINICAL DATA:  Lower abdominal pain and cramping  EXAM: CT ABDOMEN AND PELVIS WITH CONTRAST  TECHNIQUE: Multidetector CT imaging of the  abdomen and pelvis was performed using the standard protocol following bolus administration of intravenous contrast.  CONTRAST:  50mL OMNIPAQUE IOHEXOL 300 MG/ML SOLN, OMNIPAQUE IOHEXOL 300 MG/ML SOLN  COMPARISON:  07/18/2012  FINDINGS: Curvilinear bilateral lower lobe scarring is stable. Trace right pleural fluid or thickening is reidentified. Mild cardiomegaly noted. Superficial probable sebaceous cyst incompletely visualized on the previous exam is noted in the left breast lower inner quadrant, for example image 4.  The liver has a nodular contour with homogeneous internal enhancement. Moderate ascites is present. Spleen size at upper limits of normal with numerous splenorenal collaterals identified. Left mid abdominal clips are present. Adrenal glands and pancreas are normal in appearance. Gallbladder not visualized. The kidneys enhance bilaterally and symmetrically, with areas of probable renal cortical scarring but no hydronephrosis.  Mild hazy infiltration of the small bowel mesentery is identified. There are multiple dilated loops of mid to distal small bowel with a focal transition point in the left mid abdomen image 36, at the location of apparent previous bowel reanastomosis as indicated by a suture line at the apparent ileal colonic anastomotic location. This appearance is similar previously at the anastomosis but there is a new component of bowel dilatation on today's exam. A small amount of fluid tracks along the mesentery. Left ovarian cyst again noted measuring slightly smaller than previously, currently 3.9 x 3.0 cm. Evidence of apparent right hemicolectomy. Subcutaneous vascular collaterals and edema noted. Bladder unremarkable. Uterus and right ovary not visualized.  No acute osseous abnormality.  IMPRESSION: Small bowel obstruction with focal transition point at apparent ileocolic anastomosis in the left mid abdomen. No free air. Small amount of free fluid noted. Given the history of  Crohn's, this could represent recurrent disease or stricture.  Evidence of cirrhosis with portal hypertension and splenomegaly reidentified.   Electronically Signed   By: Christiana Pellant M.D.   On: 03/04/2013 23:24   Dg Abd Acute W/chest  03/04/2013   CLINICAL DATA:  Abdominal pain  EXAM: ACUTE ABDOMEN SERIES (ABDOMEN 2 VIEW & CHEST 1 VIEW)  COMPARISON:  None.  FINDINGS: The heart and pulmonary vascularity are within normal limits. The lungs are clear bilaterally.  The abdomen shows multiple dilated loops of small bowel. No free air is noted. There is a paucity of colonic gas. These changes are consistent with a small bowel obstruction. Multiple surgical changes are seen. No acute bony abnormality is noted.  IMPRESSION: Multiple dilated loops of small bowel consistent with a small bowel obstruction. No other focal abnormality is noted.   Electronically Signed   By:  Alcide Clever M.D.   On: 03/04/2013 19:25    MDM  No diagnosis found. CT scan confirms small bowel obstruction. Patient is hemodynamically stable. IV fluids. Pain management.  Admit to general medicine Dr Orvan Falconer. Discussed with general surgery Dr Lovell Sheehan.   I personally performed the services described in this documentation, which was scribed in my presence. The recorded information has been reviewed and is accurate.    Donnetta Hutching, MD 03/04/13 [phone]

## 2013-03-05 ENCOUNTER — Encounter (HOSPITAL_COMMUNITY): Payer: Self-pay | Admitting: *Deleted

## 2013-03-05 ENCOUNTER — Inpatient Hospital Stay (HOSPITAL_COMMUNITY): Payer: Medicare Other

## 2013-03-05 DIAGNOSIS — K5669 Other intestinal obstruction: Secondary | ICD-10-CM

## 2013-03-05 DIAGNOSIS — K7460 Unspecified cirrhosis of liver: Secondary | ICD-10-CM

## 2013-03-05 DIAGNOSIS — I4891 Unspecified atrial fibrillation: Secondary | ICD-10-CM

## 2013-03-05 DIAGNOSIS — K5000 Crohn's disease of small intestine without complications: Secondary | ICD-10-CM

## 2013-03-05 DIAGNOSIS — E119 Type 2 diabetes mellitus without complications: Secondary | ICD-10-CM

## 2013-03-05 DIAGNOSIS — K56609 Unspecified intestinal obstruction, unspecified as to partial versus complete obstruction: Secondary | ICD-10-CM

## 2013-03-05 DIAGNOSIS — R109 Unspecified abdominal pain: Secondary | ICD-10-CM

## 2013-03-05 DIAGNOSIS — D649 Anemia, unspecified: Secondary | ICD-10-CM

## 2013-03-05 LAB — BASIC METABOLIC PANEL
BUN: 11 mg/dL (ref 6–23)
CO2: 24 mEq/L (ref 19–32)
GFR calc Af Amer: 71 mL/min — ABNORMAL LOW (ref >90)
GFR calc non Af Amer: 61 mL/min — ABNORMAL LOW (ref >90)
Glucose, Bld: 118 mg/dL — ABNORMAL HIGH (ref 70–99)
Potassium: 4.7 mEq/L (ref 3.5–5.1)
Sodium: 131 mEq/L — ABNORMAL LOW (ref 135–145)

## 2013-03-05 LAB — HEMOGLOBIN A1C: Hgb A1c MFr Bld: 6.2 % — ABNORMAL HIGH (ref <5.7)

## 2013-03-05 LAB — CBC
Hemoglobin: 11.2 g/dL — ABNORMAL LOW (ref 12.0–15.0)
MCH: 27.9 pg (ref 26.0–34.0)
MCHC: 32.7 g/dL (ref 30.0–36.0)
MCV: 85.3 fL (ref 78.0–100.0)
RBC: 4.02 MIL/uL (ref 3.87–5.11)

## 2013-03-05 LAB — GLUCOSE, CAPILLARY
Glucose-Capillary: 111 mg/dL — ABNORMAL HIGH (ref 70–99)
Glucose-Capillary: 116 mg/dL — ABNORMAL HIGH (ref 70–99)

## 2013-03-05 LAB — MAGNESIUM: Magnesium: 1.3 mg/dL — ABNORMAL LOW (ref 1.5–2.5)

## 2013-03-05 MED ORDER — PANTOPRAZOLE SODIUM 40 MG IV SOLR
40.0000 mg | Freq: Every day | INTRAVENOUS | Status: DC
  Administered 2013-03-05 (×2): 40 mg via INTRAVENOUS
  Filled 2013-03-05 (×2): qty 40

## 2013-03-05 MED ORDER — HYDROMORPHONE HCL PF 1 MG/ML IJ SOLN: 0.5000 mg | INTRAMUSCULAR | Status: DC | PRN

## 2013-03-05 MED ORDER — LEVALBUTEROL HCL 1.25 MG/0.5ML IN NEBU: 1.2500 mg | INHALATION_SOLUTION | Freq: Four times a day (QID) | RESPIRATORY_TRACT | Status: DC | PRN

## 2013-03-05 MED ORDER — METRONIDAZOLE IN NACL 5-0.79 MG/ML-% IV SOLN
500.0000 mg | Freq: Three times a day (TID) | INTRAVENOUS | Status: DC
  Administered 2013-03-05 – 2013-03-06 (×4): 500 mg via INTRAVENOUS
  Filled 2013-03-05 (×7): qty 100

## 2013-03-05 MED ORDER — SODIUM CHLORIDE 0.9 % IV SOLN
INTRAVENOUS | Status: AC
  Administered 2013-03-05: 01:00:00 via INTRAVENOUS

## 2013-03-05 MED ORDER — INSULIN ASPART 100 UNIT/ML ~~LOC~~ SOLN
0.0000 [IU] | SUBCUTANEOUS | Status: DC
  Administered 2013-03-05 – 2013-03-06 (×2): 1 [IU] via SUBCUTANEOUS
  Administered 2013-03-06: 2 [IU] via SUBCUTANEOUS
  Administered 2013-03-06: 1 [IU] via SUBCUTANEOUS
  Administered 2013-03-06 (×2): 2 [IU] via SUBCUTANEOUS
  Administered 2013-03-06 – 2013-03-07 (×2): 1 [IU] via SUBCUTANEOUS

## 2013-03-05 MED ORDER — POTASSIUM CHLORIDE IN NACL 20-0.9 MEQ/L-% IV SOLN
INTRAVENOUS | Status: DC
  Administered 2013-03-05 – 2013-03-06 (×3): via INTRAVENOUS

## 2013-03-05 MED ORDER — CIPROFLOXACIN IN D5W 400 MG/200ML IV SOLN
400.0000 mg | Freq: Two times a day (BID) | INTRAVENOUS | Status: DC
  Administered 2013-03-05 – 2013-03-06 (×3): 400 mg via INTRAVENOUS
  Filled 2013-03-05 (×5): qty 200

## 2013-03-05 MED ORDER — METHYLPREDNISOLONE SODIUM SUCC 40 MG IJ SOLR
30.0000 mg | Freq: Two times a day (BID) | INTRAMUSCULAR | Status: DC
  Administered 2013-03-05 – 2013-03-06 (×3): 30 mg via INTRAVENOUS
  Filled 2013-03-05 (×3): qty 1

## 2013-03-05 MED ORDER — METRONIDAZOLE IN NACL 5-0.79 MG/ML-% IV SOLN
500.0000 mg | Freq: Once | INTRAVENOUS | Status: AC
  Administered 2013-03-05: 500 mg via INTRAVENOUS
  Filled 2013-03-05: qty 100

## 2013-03-05 MED ORDER — ONDANSETRON HCL 4 MG/2ML IJ SOLN
4.0000 mg | INTRAMUSCULAR | Status: DC | PRN
  Administered 2013-03-05: 4 mg via INTRAVENOUS
  Filled 2013-03-05: qty 2

## 2013-03-05 MED ORDER — ONDANSETRON HCL 4 MG/2ML IJ SOLN: 4.0000 mg | Freq: Three times a day (TID) | INTRAMUSCULAR | Status: DC | PRN

## 2013-03-05 NOTE — Consult Note (Signed)
Referring Provider: No ref. provider found Primary Care Physician:  Rudi Heap, MD Primary Gastroenterologist:  Dr. Karilyn Cota  Reason for Consultation:   Small bowel obstruction in a patient with history of Crohn's disease.  HPI:  Patient is 77 year old Caucasian female with history of ileocolonic and rectal Crohn's disease status post 4 or 5 bowel surgeries([facility]; last surgery 6 or 7 years ago). Chronic GERD, history of peptic ulcer disease and cirrhosis secondary to NAFLD who was in usual state of health until the evening of 03/03/2013 when she developed severe abdominal pain nausea and heaving. This pain reminded her of the time and she had small bowel obstruction. She stopped passing flatus. She did not experience fever or chills. She was not able to rest even though the pain decreased in severity. Yesterday the pain became worse and she did have a small bowel movement. She finally came to the emergency room. On evaluation she was felt to have small bowel obstruction with transition zone at the level of ileocolonic anastomosis. NG tube placement was attempted but she started to bleed and therefore tube was not placed. She was begun on IV fluids kept n.p.o. and also begun on Cipro and metronidazole. This morning she had 2 explosive bowel movements when she passed liquid stool and gas and feels much better. She was given clear liquids and had no difficulty. She denies melena or rectal bleeding. She states she has not been taking oral mesalamine regularly. She'll take it for a few days and then skip 3-5 days. She's states cost is not an issue. She does not have heartburn as long as she takes her Prevacid and she also denies dysphagia. She remains on warfarin and does not take NSAIDs. She denies chest pain or shortness of breath. She has not lost any weight recently. She states she's been under a lot of stress because she is having to take care of her 58 year old granddaughter. She is hoping to  give up this responsibility soon. 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 Both of these strictures were diagnosed in 2011 at [facility] in [facility] and most recently at this facility in March 2014. She was noted to have ulcers distal to ileocolonic anastomosis and few erosions proximal to anastomosis. Stricture was 10 mm and dilated to 13.5 mm. Distal rectal stricture was dilated to 15 mm. She also had EGD at the time of colonoscopy revealing soft stricture at GE junction which was dilated by passing 54 [place] Maloney dilator. There was no evidence of varices or peptic ulcer disease. Patient also has cirrhosis secondary to NAFLD. She had normal AFP on 01/24/2013 and CT from this admission does not show any suspicious lesions in her liver.  Past Medical History  Diagnosis Date  . Hypertension     x 5 years  . Paroxysmal atrial fibrillation   . Anxiety   . Crohn's disease   . 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 No clear documentation of this  . Diabetes mellitus   . Neuromuscular disorder     periph neuropathy  . Shortness of breath   . Arthritis   . GERD (gastroesophageal reflux disease)   . Blood transfusion   . COPD (chronic obstructive pulmonary disease)   . Cirrhosis     non aloholic   . Pleural effusion 02/13/2012  . RVH (right ventricular hypertrophy) 02/13/2012  . Anemia 02/13/2012  . Chronic respiratory failure 02/15/2012  With hypoxia and hypercapnia.  . Anasarca 01/2012    Ejection fraction 60-65%  . CHF (congestive heart failure)     Past Surgical History  Procedure Laterality Date  . Basal cell carcinoma excision    . Abdominal surgery      multiple  . Vesicovaginal fistula closure w/ tah    . Cancers resected    . Appendectomy    . Abdominal hysterectomy    . Tubal ligation    . Cholecystectomy    . Colon surgery      hx bleeding ulcer  . Mass excision  06/23/2011    Procedure:  EXCISION MASS;  Surgeon: Tami Ribas, MD;  Location: [facility];  Service: Orthopedics;  Laterality: Left;  left hand excision mass with acell placement  . Left hand  06/23/11    Removed Squamous cell , and also did graft  . Mass excision  11/02/2011    Procedure: EXCISION MASS;  Surgeon: Tami Ribas, MD;  Location: [facility];  Service: Orthopedics;  Laterality: Left;  EXCISION MASS LEFT HAND with full thickness skin graft from left upper arm  . Colonoscopy with esophagogastroduodenoscopy (egd) N/A 08/01/2012    Procedure: COLONOSCOPY WITH ESOPHAGOGASTRODUODENOSCOPY (EGD);  Surgeon: Malissa Hippo, MD;  [facility];  Service: Endoscopy;  Laterality: N/A;  730  . Balloon dilation N/A 08/01/2012    Procedure: BALLOON DILATION;  Surgeon: Malissa Hippo, MD;  [facility];  Service: Endoscopy;  Laterality: N/A;  Elease Hashimoto dilation N/A 08/01/2012    Procedure: Elease Hashimoto DILATION;  Surgeon: Malissa Hippo, MD;  [facility];  Service: Endoscopy;  Laterality: N/A;  . Savory dilation N/A 08/01/2012    Procedure: SAVORY DILATION;  Surgeon: Malissa Hippo, MD;  [facility]: [facility];  Service: Endoscopy;  Laterality: N/A;    Prior to Admission medications   Medication Sig Start Date End Date Taking? Authorizing Provider  acetaminophen (TYLENOL) 500 MG tablet Take 500 mg by mouth every 8 (eight) hours as needed. Pain   Yes Historical Provider, MD  ALPRAZolam (XANAX) 0.25 MG tablet Take 0.375 mg by mouth every morning. Takes one and one-half tablet in the morning   Yes Historical Provider, MD  diltiazem (CARDIZEM CD) 180 MG 24 hr capsule Take 180 mg by mouth daily.   Yes Historical Provider, MD  ferrous sulfate 325 (65 FE) MG tablet Take 325 mg by mouth. Patient takes this medication 3 times per week   Yes Historical Provider, MD  furosemide (LASIX) 20 MG tablet Take 1 tablet (20 mg total) by mouth daily. New medication for fluid retention.  02/22/13  Yes Ernestina Penna, MD  gabapentin (NEURONTIN) 100 MG capsule Take 100 mg by mouth at bedtime.   Yes Historical Provider, MD  lansoprazole (PREVACID) 30 MG capsule Take 30 mg by mouth daily as needed (for acid reflux).    Yes Historical Provider, MD  levalbuterol [facility] HFA) 45 MCG/ACT inhaler Inhale 1-2 puffs into the lungs every 4 (four) hours as needed for wheezing. 02/15/12  Yes Elliot Cousin, MD  Mesalamine (ASACOL HD) 800 MG TBEC Take 800 mg by mouth daily.    Yes Historical Provider, MD  metFORMIN (GLUCOPHAGE) 1000 MG tablet Take 1,000 mg by mouth 2 (two) times daily.   Yes Historical Provider, MD  Multiple Vitamin (MULTIVITAMIN) tablet Take 1 tablet by mouth daily.     Yes Historical Provider, MD  potassium chloride (K-DUR) 10  MEQ tablet Take 10 mEq by mouth every morning.   Yes Historical Provider, MD  spironolactone (ALDACTONE) 50 MG tablet Take 50 mg by mouth 2 (two) times daily.   Yes Historical Provider, MD  vitamin C (ASCORBIC ACID) 500 MG tablet Take 500 mg by mouth daily.   Yes Historical Provider, MD  Vitamin D, Ergocalciferol, (DRISDOL) 50000 UNITS CAPS capsule Take 50,000 Units by mouth every Monday.   Yes Historical Provider, MD  warfarin (COUMADIN) 1 MG tablet Take 1 mg by mouth See admin instructions. Tuesday and Thursday patient takes 1 tablet , the other days she states she takes 1/2 tablet   Yes Historical Provider, MD    Current Facility-Administered Medications  Medication Dose Route Frequency Provider Last Rate Last Dose  . 0.9 % NaCl with KCl 20 mEq/ L  infusion   Intravenous Continuous Vania Rea, MD 100 mL/hr at 03/05/13 0254    . ciprofloxacin (CIPRO) IVPB 400 mg  400 mg Intravenous Q12H Vania Rea, MD   400 mg at 03/05/13 1326  . HYDROmorphone (DILAUDID) injection 0.5-1 mg  0.5-1 mg Intravenous Q2H PRN Vania Rea, MD      . insulin aspart (novoLOG) injection 0-9 Units  0-9 Units Subcutaneous Q4H Vania Rea, MD      . levalbuterol  [facility]) nebulizer solution 1.25 mg  1.25 mg Nebulization Q6H PRN Vania Rea, MD      . methylPREDNISolone sodium succinate (SOLU-MEDROL) 40 mg/mL injection 30 mg  30 mg Intravenous Q12H Malissa Hippo, MD      . metroNIDAZOLE (FLAGYL) IVPB 500 mg  500 mg Intravenous Q8H Vania Rea, MD   500 mg at 03/05/13 1221  . ondansetron (ZOFRAN) injection 4 mg  4 mg Intravenous Q4H PRN Vania Rea, MD   4 mg at 03/05/13 0954  . pantoprazole (PROTONIX) injection 40 mg  40 mg Intravenous QHS Vania Rea, MD   40 mg at 03/05/13 0254    Allergies as of 03/04/2013 - Review Complete 03/04/2013  Allergen Reaction Noted  . Sulfonamide derivatives Other (See Comments)     Family History  Problem Relation Age of Onset  . Coronary artery disease Father   . Heart attack Father   . Coronary artery disease Mother   . Coronary artery disease Brother   . Heart attack Brother   . Pancreatic cancer Brother   . Heart attack Brother   . Leukemia Daughter   . Healthy Daughter   . Colon cancer Neg Hx     History   Social History  . Marital Status: Widowed    Spouse Name: N/A    Number of Children: N/A  . Years of Education: N/A   Occupational History  . Not on file.   Social History Main Topics  . Smoking status: Current Every Day Smoker -- 0.50 packs/day for 40 years    Types: Cigarettes    Start date: 06/02/1963  . Smokeless tobacco: Never Used  . Alcohol Use: No  . Drug Use: No  . Sexual Activity: Not on file   Other Topics Concern  . Not on file   Social History Narrative  . No narrative on file    Review of Systems: See HPI, otherwise normal ROS  Physical Exam: Temp:  [97.3 F (36.3 C)-98 F (36.7 C)] 97.3 F (36.3 C) (10/05 0509) Pulse Rate:  [103-116] 103 (10/05 0509) Resp:  [18-20] 18 (10/05 0509) BP: (101-128)/(41-81) 101/49 mmHg (10/05 0509) SpO2:  [91 %-100 %] 97 % (10/05  1478) Weight:  [190 lb 14.7 oz (86.6 kg)-196 lb (88.905 kg)] 190 lb 14.7 oz  (86.6 kg) (10/05 0509) Patient is alert and in no acute distress. Conjunctiva is pink and sclerae is nonicteric. Oropharyngeal mucosa is normal. She has dentures in place. No neck masses or thyromegaly noted. Cardiac exam with irregular rhythm normal S1 and S2. No murmur or gallop noted. Abdomen is full with small umbilicus hernia which is soft and nontender. Bowel sounds are hyperactive. Abdomen is soft with fullness and tenderness in left upper quadrant. No organomegaly or masses. Nonpitting pretibial edema noted. She has multiple keratosis with large lesion over the upper sternal area.      Lab Results:  Recent Labs  03/04/13 1817 03/05/13 0424  WBC 7.6 8.5  HGB 12.2 11.2*  HCT 36.8 34.3*  PLT 169 171   BMET  Recent Labs  03/04/13 1817 03/05/13 0424  NA 126* 131*  K 4.5 4.7  CL 92* 97  CO2 22 24  GLUCOSE 121* 118*  BUN 12 11  CREATININE 1.00 0.90  CALCIUM 10.1 9.1   LFT  Recent Labs  03/04/13 1817  PROT 7.2  ALBUMIN 2.9*  AST 32  ALT 16  ALKPHOS 101  BILITOT 1.5*   PT/INR  Recent Labs  03/05/13 0424  LABPROT 25.5*  INR 2.42*   Hepatitis Panel No results found for this basename: HEPBSAG, HCVAB, HEPAIGM, HEPBIGM,  in the last 72 hours  Studies/Results: Dg Abd 1 View  03/05/2013   *RADIOLOGY REPORT*  Clinical Data: Bowel obstruction, abdominal pain and swelling, subsequent encounter.  ABDOMEN - 1 VIEW  Comparison: 03/04/2013; CT abdomen pelvis - 03/04/2013  Findings:  Redemonstrated marked gaseous distension of a rather features loop of presumed small bowel within the mid hemiabdomen measuring approximately 6 cm in diameter.  There is a minimal amount of distal bowel gas.  Previously administered enteric contrast is seen within the descending colon.  Excreted contrast is seen in the urinary bladder.  Multiple surgical clips are seen within the upper abdomen.  Multilevel thoracolumbar spine degenerative change.  IMPRESSION: Overall findings suggestive of  partial small bowel obstruction with marked gaseous distension of a solitary loop of small bowel within the mid hemiabdomen.   Original Report Authenticated By: Tacey Ruiz, MD   Ct Abdomen Pelvis W Contrast  03/04/2013   CLINICAL DATA:  Lower abdominal pain and cramping  EXAM: CT ABDOMEN AND PELVIS WITH CONTRAST  TECHNIQUE: Multidetector CT imaging of the abdomen and pelvis was performed using the standard protocol following bolus administration of intravenous contrast.  CONTRAST:  50mL OMNIPAQUE IOHEXOL 300 MG/ML SOLN, OMNIPAQUE IOHEXOL 300 MG/ML SOLN  COMPARISON:  07/18/2012  FINDINGS: Curvilinear bilateral lower lobe scarring is stable. Trace right pleural fluid or thickening is reidentified. Mild cardiomegaly noted. Superficial probable sebaceous cyst incompletely visualized on the previous exam is noted in the left breast lower inner quadrant, for example image 4.  The liver has a nodular contour with homogeneous internal enhancement. Moderate ascites is present. Spleen size at upper limits of normal with numerous splenorenal collaterals identified. Left mid abdominal clips are present. Adrenal glands and pancreas are normal in appearance. Gallbladder not visualized. The kidneys enhance bilaterally and symmetrically, with areas of probable renal cortical scarring but no hydronephrosis.  Mild hazy infiltration of the small bowel mesentery is identified. There are multiple dilated loops of mid to distal small bowel with a focal transition point in the left mid abdomen image 36, at the location  of apparent previous bowel reanastomosis as indicated by a suture line at the apparent ileal colonic anastomotic location. This appearance is similar previously at the anastomosis but there is a new component of bowel dilatation on today's exam. A small amount of fluid tracks along the mesentery. Left ovarian cyst again noted measuring slightly smaller than previously, currently 3.9 x 3.0 cm. Evidence of apparent  right hemicolectomy. Subcutaneous vascular collaterals and edema noted. Bladder unremarkable. Uterus and right ovary not visualized.  No acute osseous abnormality.  IMPRESSION: Small bowel obstruction with focal transition point at apparent ileocolic anastomosis in the left mid abdomen. No free air. Small amount of free fluid noted. Given the history of Crohn's, this could represent recurrent disease or stricture.  Evidence of cirrhosis with portal hypertension and splenomegaly reidentified.   Electronically Signed   By: Christiana Pellant M.D.   On: 03/04/2013 23:24   Dg Abd Acute W/chest  03/04/2013   CLINICAL DATA:  Abdominal pain  EXAM: ACUTE ABDOMEN SERIES (ABDOMEN 2 VIEW & CHEST 1 VIEW)  COMPARISON:  None.  FINDINGS: The heart and pulmonary vascularity are within normal limits. The lungs are clear bilaterally.  The abdomen shows multiple dilated loops of small bowel. No free air is noted. There is a paucity of colonic gas. These changes are consistent with a small bowel obstruction. Multiple surgical changes are seen. No acute bony abnormality is noted.  IMPRESSION: Multiple dilated loops of small bowel consistent with a small bowel obstruction. No other focal abnormality is noted.   Electronically Signed   By: Alcide Clever M.D.   On: 03/04/2013 19:25  I have reviewed patient's abdominopelvic CT as well as acute abdominal series.   Assessment; Acute illness appears to be secondary to partial intestinal obstruction secondary to ileal colonic anastomotic stricture where she was documented to have active disease on colonoscopy of March 2014.patient unfortunately has not been compliant with mesalamine and she also has been under a lot of stress because she is caring for her 84 year old granddaughter acute gastroenteritis is in differential diagnosis but less likely.GI pathogen panel will be obtained. She will be treated with Solu-Medrol at reduced dose given that she is diabetic and also has history of  CHF. Cirrhosis at the present, appears to be well compensated.  Recommendations; GI pathogen panel. CRP. Continue IV Cipro and metronidazole for now. Solu-Medrol 30 mg IV every 12 hours. Will advance diet in a.m.  Patient will be reevaluated in a.m.   LOS: 1 day   Missy Baksh U  03/05/2013, 3:11 PM

## 2013-03-05 NOTE — Progress Notes (Signed)
ANTIBIOTIC CONSULT NOTE - INITIAL  Pharmacy Consult for Cipro Indication: SBO in Crohn's disease  Allergies  Allergen Reactions  . Sulfonamide Derivatives Other (See Comments)    Felt funny    Patient Measurements: Height: 5' (152.4 cm) Weight: 192 lb 3.9 oz (87.2 kg) IBW/kg (Calculated) : 45.5   Vital Signs: Temp: 97.8 F (36.6 C) (10/05 0120) Temp src: Oral (10/05 0120) BP: 107/57 mmHg (10/05 0120) Pulse Rate: 103 (10/05 0458) Intake/Output from previous day:   Intake/Output from this shift:    Labs:  Recent Labs  03/04/13 1817  WBC 7.6  HGB 12.2  PLT 169  CREATININE 1.00   Estimated Creatinine Clearance: 47.7 ml/min (by C-G formula based on Cr of 1).   Microbiology: No results found for this or any previous visit (from the past 720 hour(s)).  Medical History: Past Medical History  Diagnosis Date  . Hypertension     x 5 years  . Paroxysmal atrial fibrillation   . Anxiety   . Crohn's disease   . 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 No clear documentation of this  . Diabetes mellitus   . Neuromuscular disorder     periph neuropathy  . Shortness of breath   . Arthritis   . GERD (gastroesophageal reflux disease)   . Blood transfusion   . COPD (chronic obstructive pulmonary disease)   . Cirrhosis     non aloholic   . Pleural effusion 02/13/2012  . RVH (right ventricular hypertrophy) 02/13/2012  . Anemia 02/13/2012  . Chronic respiratory failure 02/15/2012    With hypoxia and hypercapnia.  . Anasarca 01/2012    Ejection fraction 60-65%  . CHF (congestive heart failure)     Medications:  Flagyl 500mg  IV q8h Assessment: 77yo F admitted for symptoms related to her Crohn's Disease. CT reveals small bowel obstruction so antibiotics initiated for empiric coverage.  Plan:  1. Cipro 400mg  IV q12h while pt is NPO. 2. Will continue current regimen unless renal function should decline resulting in a recommendation of decreased dose.   Thanks!  Shanon Ace G 03/05/2013,5:00 AM

## 2013-03-05 NOTE — H&P (Addendum)
[facility] History and Physical  [name]  MWU:[id number]  DOB: 02-May-1936   DOA: 03/05/2013   PCP:   Rudi Heap, MD   Chief Complaint:  Abdominal pain and vomiting for 2 days   HPI: [name] is a 77 y.o. female.   Elderly Caucasian lady with a history of Crohn's disease and associated stricture, including esophageal, rectal, and at her ileocolonic anastomosis, complains of abdominal pain since Thursday progressing to her abdominal swelling and vomiting since yesterday and eventually came to the emergency room today for assistance. She describes her vomitus of consistent of "dull clear" material and undigested food, not yellow not green, and no blood.  During her workup in the emergency room she ultimately had a CT scan of the abdomen which revealed small bowel obstruction with a cutoff in the region of her ileocolonic anastomosis.   Denies fever or chills; denies diarrhea or bloody stool   Past Medical History  Diagnosis Date  . Hypertension     x 5 years  . Paroxysmal atrial fibrillation   . Anxiety   . Crohn's disease   . 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 No clear documentation of this  . Diabetes mellitus   . Neuromuscular disorder     periph neuropathy  . Shortness of breath   . Arthritis   . GERD (gastroesophageal reflux disease)   . Blood transfusion   . COPD (chronic obstructive pulmonary disease)   . Cirrhosis     non aloholic   . Pleural effusion 02/13/2012  . RVH (right ventricular hypertrophy) 02/13/2012  . Anemia 02/13/2012  . Chronic respiratory failure 02/15/2012    With hypoxia and hypercapnia.  . Anasarca 01/2012    Ejection fraction 60-65%  . CHF (congestive heart failure)     Past Surgical History  Procedure Laterality Date  . Basal cell carcinoma excision    . Abdominal surgery      multiple  . Vesicovaginal fistula closure w/ tah    . Cancers resected    . Appendectomy    . Abdominal  hysterectomy    . Tubal ligation    . Cholecystectomy    . Colon surgery      hx bleeding ulcer  . Mass excision  06/23/2011    Procedure: EXCISION MASS;  Surgeon: Tami Ribas, MD;  Location: [facility];  Service: Orthopedics;  Laterality: Left;  left hand excision mass with acell placement  . Left hand  06/23/11    Removed Squamous cell , and also did graft  . Mass excision  11/02/2011    Procedure: EXCISION MASS;  Surgeon: Tami Ribas, MD;  Location: [facility];  Service: Orthopedics;  Laterality: Left;  EXCISION MASS LEFT HAND with full thickness skin graft from left upper arm  . Colonoscopy with esophagogastroduodenoscopy (egd) N/A 08/01/2012    Procedure: COLONOSCOPY WITH ESOPHAGOGASTRODUODENOSCOPY (EGD);  Surgeon: Malissa Hippo, MD;  Location: [facility];  Service: Endoscopy;  Laterality: N/A;  730  . Balloon dilation N/A 08/01/2012    Procedure: BALLOON DILATION;  Surgeon: Malissa Hippo, MD;  Location: [facility];  Service: Endoscopy;  Laterality: N/A;  Elease Hashimoto dilation N/A 08/01/2012    Procedure: Elease Hashimoto DILATION;  Surgeon: Malissa Hippo, MD;  Location: [facility];  Service: Endoscopy;  Laterality: N/A;  . Savory dilation N/A 08/01/2012    Procedure: SAVORY DILATION;  Surgeon: Malissa Hippo, MD;  Location: [facility];  Service: Endoscopy;  Laterality: N/A;    Medications:  HOME MEDS: Prior to Admission medications   Medication Sig Start Date End Date Taking? Authorizing Provider  acetaminophen (TYLENOL) 500 MG tablet Take 500 mg by mouth every 8 (eight) hours as needed. Pain   Yes Historical Provider, MD  ALPRAZolam (XANAX) 0.25 MG tablet Take 0.375 mg by mouth every morning. Takes one and one-half tablet in the morning   Yes Historical Provider, MD  diltiazem (CARDIZEM CD) 180 MG 24 hr capsule Take 180 mg by mouth daily.   Yes Historical Provider, MD  ferrous sulfate 325 (65 FE) MG tablet Take 325 mg by mouth. Patient takes this  medication 3 times per week   Yes Historical Provider, MD  furosemide (LASIX) 20 MG tablet Take 1 tablet (20 mg total) by mouth daily. New medication for fluid retention. 02/22/13  Yes Ernestina Penna, MD  gabapentin (NEURONTIN) 100 MG capsule Take 100 mg by mouth at bedtime.   Yes Historical Provider, MD  lansoprazole (PREVACID) 30 MG capsule Take 30 mg by mouth daily as needed (for acid reflux).    Yes Historical Provider, MD  levalbuterol [facility] HFA) 45 MCG/ACT inhaler Inhale 1-2 puffs into the lungs every 4 (four) hours as needed for wheezing. 02/15/12  Yes Elliot Cousin, MD  Mesalamine (ASACOL HD) 800 MG TBEC Take 800 mg by mouth daily.    Yes Historical Provider, MD  metFORMIN (GLUCOPHAGE) 1000 MG tablet Take 1,000 mg by mouth 2 (two) times daily.   Yes Historical Provider, MD  Multiple Vitamin (MULTIVITAMIN) tablet Take 1 tablet by mouth daily.     Yes Historical Provider, MD  potassium chloride (K-DUR) 10 MEQ tablet Take 10 mEq by mouth every morning.   Yes Historical Provider, MD  spironolactone (ALDACTONE) 50 MG tablet Take 50 mg by mouth 2 (two) times daily.   Yes Historical Provider, MD  vitamin C (ASCORBIC ACID) 500 MG tablet Take 500 mg by mouth daily.   Yes Historical Provider, MD  Vitamin D, Ergocalciferol, (DRISDOL) 50000 UNITS CAPS capsule Take 50,000 Units by mouth every Monday.   Yes Historical Provider, MD  warfarin (COUMADIN) 1 MG tablet Take 1 mg by mouth See admin instructions. Tuesday and Thursday patient takes 1 tablet , the other days she states she takes 1/2 tablet   Yes Historical Provider, MD     Allergies:  Allergies  Allergen Reactions  . Sulfonamide Derivatives Other (See Comments)    Felt funny    Social History:   reports that she has been smoking Cigarettes.  She started smoking about 49 years ago. She has a 20 pack-year smoking history. She has never used smokeless tobacco. She reports that she does not drink alcohol or use illicit drugs.  Family  History: Family History  Problem Relation Age of Onset  . Coronary artery disease Father   . Heart attack Father   . Coronary artery disease Mother   . Coronary artery disease Brother   . Heart attack Brother   . Pancreatic cancer Brother   . Heart attack Brother   . Leukemia Daughter   . Healthy Daughter   . Colon cancer Neg Hx      Physical Exam: Filed Vitals:   03/04/13 1744 03/04/13 2214 03/05/13 0024 03/05/13 0120  BP: 128/81 102/41 109/57 107/57  Pulse: 116 111 105 107  Temp: 98 F (36.7 C)   97.8 F (36.6 C)  TempSrc: Oral   Oral  Resp: 20 18 18 18   Height: 5' (1.524 m)     Weight: 88.905 kg (196 lb)   87.2 kg (192 lb 3.9 oz)  SpO2: 100% 96% 93% 96%   Blood pressure 107/57, pulse 107, temperature 97.8 F (36.6 C), temperature source Oral, resp. rate 18, height 5' (1.524 m), weight 87.2 kg (192 lb 3.9 oz), SpO2 96.00%. Body mass index is 37.54 kg/(m^2).   GEN:  Pleasant but uncomfortable-looking elderly lady lying bed; bilious-looking vomit on her clothes;  cooperative with exam PSYCH:  alert and oriented x4;   affect is appropriate. HEENT: Mucous membranes pink and anicteric;  Breasts:: Not examined CHEST WALL: No tenderness CHEST: Normal respiration, clear to auscultation bilaterally HEART: Irregularly irregular rhythm ABDOMEN: Obese, distended, diffuse tenderness, inverted umbilicus, increased high-pitched and gurgling bowel sounds; no intertriginous candida. Rectal Exam: Not done EXTREMITIES: age-appropriate arthropathy of the hands and knees; 2+edema; no ulcerations. Genitalia: not examined PULSES: 2+ and symmetric SKIN: Normal hydration no rash or ulceration    Labs on Admission:  Basic Metabolic Panel:  Recent Labs Lab 03/04/13 1817  NA 126*  K 4.5  CL 92*  CO2 22  GLUCOSE 121*  BUN 12  CREATININE 1.00  CALCIUM 10.1   Liver Function Tests:  Recent Labs Lab 03/04/13 1817  AST 32  ALT 16  ALKPHOS 101  BILITOT 1.5*  PROT 7.2  ALBUMIN  2.9*    Recent Labs Lab 03/04/13 1817  LIPASE 68*   No results found for this basename: AMMONIA,  in the last 168 hours CBC:  Recent Labs Lab 03/04/13 1817  WBC 7.6  NEUTROABS 5.9  HGB 12.2  HCT 36.8  MCV 84.4  PLT 169   Cardiac Enzymes: No results found for this basename: CKTOTAL, CKMB, CKMBINDEX, TROPONINI,  in the last 168 hours BNP: No components found with this basename: POCBNP,  D-dimer: No components found with this basename: D-DIMER,  CBG: No results found for this basename: GLUCAP,  in the last 168 hours  Radiological Exams on Admission: Ct Abdomen Pelvis W Contrast  03/04/2013   CLINICAL DATA:  Lower abdominal pain and cramping  EXAM: CT ABDOMEN AND PELVIS WITH CONTRAST  TECHNIQUE: Multidetector CT imaging of the abdomen and pelvis was performed using the standard protocol following bolus administration of intravenous contrast.  CONTRAST:  50mL OMNIPAQUE IOHEXOL 300 MG/ML SOLN, OMNIPAQUE IOHEXOL 300 MG/ML SOLN  COMPARISON:  07/18/2012  FINDINGS: Curvilinear bilateral lower lobe scarring is stable. Trace right pleural fluid or thickening is reidentified. Mild cardiomegaly noted. Superficial probable sebaceous cyst incompletely visualized on the previous exam is noted in the left breast lower inner quadrant, for example image 4.  The liver has a nodular contour with homogeneous internal enhancement. Moderate ascites is present. Spleen size at upper limits of normal with numerous splenorenal collaterals identified. Left mid abdominal clips are present. Adrenal glands and pancreas are normal in appearance. Gallbladder not visualized. The kidneys enhance bilaterally and symmetrically, with areas of probable renal cortical scarring but no hydronephrosis.  Mild hazy infiltration of the small bowel mesentery is identified. There are multiple dilated loops of mid to distal small bowel with a focal transition point in the left mid abdomen image 36, at the location of apparent  previous bowel reanastomosis as indicated by a suture line at the apparent ileal colonic anastomotic location. This appearance is similar previously at the anastomosis but there is a new component of bowel dilatation on today's exam. A small amount of fluid tracks  along the mesentery. Left ovarian cyst again noted measuring slightly smaller than previously, currently 3.9 x 3.0 cm. Evidence of apparent right hemicolectomy. Subcutaneous vascular collaterals and edema noted. Bladder unremarkable. Uterus and right ovary not visualized.  No acute osseous abnormality.  IMPRESSION: Small bowel obstruction with focal transition point at apparent ileocolic anastomosis in the left mid abdomen. No free air. Small amount of free fluid noted. Given the history of Crohn's, this could represent recurrent disease or stricture.  Evidence of cirrhosis with portal hypertension and splenomegaly reidentified.   Electronically Signed   By: Christiana Pellant M.D.   On: 03/04/2013 23:24   Dg Abd Acute W/chest  03/04/2013   CLINICAL DATA:  Abdominal pain  EXAM: ACUTE ABDOMEN SERIES (ABDOMEN 2 VIEW & CHEST 1 VIEW)  COMPARISON:  None.  FINDINGS: The heart and pulmonary vascularity are within normal limits. The lungs are clear bilaterally.  The abdomen shows multiple dilated loops of small bowel. No free air is noted. There is a paucity of colonic gas. These changes are consistent with a small bowel obstruction. Multiple surgical changes are seen. No acute bony abnormality is noted.  IMPRESSION: Multiple dilated loops of small bowel consistent with a small bowel obstruction. No other focal abnormality is noted.   Electronically Signed   By: Alcide Clever M.D.   On: 03/04/2013 19:25    Assessment/Plan    Active Problems:   DM   OVERWEIGHT/OBESITY   CROHN'S DISEASE   Cirrhosis, nonalcoholic  atrial fibrillation on chronic long-term anticoagulations    Small bowel obstruction  PLAN: Keep her n.p.o., NG tube to low intermittent  suction to decompress bowel, IV fluids replacement and maintenance  Empirically start antibiotics; will not give steroids; consult GI service.  Sliding scale insulin while n.p.o.  Adequate pain management  we may need to start Cardizem drip if her afib rate becomes uncontrolled while n.p.o. Consider  heparin anticoagulations if she will be n.p.o. for an extended period and cannot take her warfarin   Other plans as per orders.  Code Status: Full code Family Communication:  Plans discuss with patient no family at bedside   Braedyn Riggle Nocturnist [facility] Pager [phone]   03/05/2013, 1:59 AM

## 2013-03-05 NOTE — Progress Notes (Signed)
Tried to place NG tube. Pt said it did not feel right and we couldn't get proper placement. Pulled back and attempted to try again and patient decided she did not want to try again that she wanted to wait until she spoke with Dr. Karilyn Cota. Called Dr. Orvan Falconer to inform him the she was refusing NG tube placement and also got an order to hold on the foley catheter since she would be mobile.

## 2013-03-05 NOTE — Progress Notes (Signed)
[name] YNW:[id number] DOB: April 11, 1936 DOA: 03/04/2013 PCP: Rudi Heap, MD   Subjective: This very pleasant 77 year old lady was admitted yesterday with small bowel obstruction. She feels significantly improved and has opened her bowels yesterday she says. Her abdomen is less swollen and not as painful as it was yesterday. She did not tolerate an NG tube but she has not had any more vomiting.           Physical Exam: Blood pressure 101/49, pulse 103, temperature 97.3 F (36.3 C), temperature source Oral, resp. rate 18, height 5' (1.524 m), weight 86.6 kg (190 lb 14.7 oz), SpO2 97.00%. She looks systemically well. She is not toxic or septic. Her abdomen is actually soft and nontender. Bowel sounds are heard and do not appear to be hyperactive at this point. Lung fields are clear. Heart sounds are present and normal. She is in atrial fibrillation. She is alert and orientated.   Investigations:     Basic Metabolic Panel:  Recent Labs  65/78/46 1817 03/05/13 0424  NA 126* 131*  K 4.5 4.7  CL 92* 97  CO2 22 24  GLUCOSE 121* 118*  BUN 12 11  CREATININE 1.00 0.90  CALCIUM 10.1 9.1  MG  --  1.3*   Liver Function Tests:  Recent Labs  03/04/13 1817  AST 32  ALT 16  ALKPHOS 101  BILITOT 1.5*  PROT 7.2  ALBUMIN 2.9*     CBC:  Recent Labs  03/04/13 1817 03/05/13 0424  WBC 7.6 8.5  NEUTROABS 5.9  --   HGB 12.2 11.2*  HCT 36.8 34.3*  MCV 84.4 85.3  PLT 169 171    Ct Abdomen Pelvis W Contrast  03/04/2013   CLINICAL DATA:  Lower abdominal pain and cramping  EXAM: CT ABDOMEN AND PELVIS WITH CONTRAST  TECHNIQUE: Multidetector CT imaging of the abdomen and pelvis was performed using the standard protocol following bolus administration of intravenous contrast.  CONTRAST:  50mL OMNIPAQUE IOHEXOL 300 MG/ML SOLN, OMNIPAQUE IOHEXOL 300 MG/ML SOLN  COMPARISON:  07/18/2012  FINDINGS: Curvilinear bilateral lower lobe scarring is stable. Trace right pleural  fluid or thickening is reidentified. Mild cardiomegaly noted. Superficial probable sebaceous cyst incompletely visualized on the previous exam is noted in the left breast lower inner quadrant, for example image 4.  The liver has a nodular contour with homogeneous internal enhancement. Moderate ascites is present. Spleen size at upper limits of normal with numerous splenorenal collaterals identified. Left mid abdominal clips are present. Adrenal glands and pancreas are normal in appearance. Gallbladder not visualized. The kidneys enhance bilaterally and symmetrically, with areas of probable renal cortical scarring but no hydronephrosis.  Mild hazy infiltration of the small bowel mesentery is identified. There are multiple dilated loops of mid to distal small bowel with a focal transition point in the left mid abdomen image 36, at the location of apparent previous bowel reanastomosis as indicated by a suture line at the apparent ileal colonic anastomotic location. This appearance is similar previously at the anastomosis but there is a new component of bowel dilatation on today's exam. A small amount of fluid tracks along the mesentery. Left ovarian cyst again noted measuring slightly smaller than previously, currently 3.9 x 3.0 cm. Evidence of apparent right hemicolectomy. Subcutaneous vascular collaterals and edema noted. Bladder unremarkable. Uterus and right ovary not visualized.  No acute osseous abnormality.  IMPRESSION: Small bowel obstruction with focal transition point at apparent ileocolic anastomosis in the left mid abdomen. No free air.  Small amount of free fluid noted. Given the history of Crohn's, this could represent recurrent disease or stricture.  Evidence of cirrhosis with portal hypertension and splenomegaly reidentified.   Electronically Signed   By: Christiana Pellant M.D.   On: 03/04/2013 23:24   Dg Abd Acute W/chest  03/04/2013   CLINICAL DATA:  Abdominal pain  EXAM: ACUTE ABDOMEN SERIES (ABDOMEN  2 VIEW & CHEST 1 VIEW)  COMPARISON:  None.  FINDINGS: The heart and pulmonary vascularity are within normal limits. The lungs are clear bilaterally.  The abdomen shows multiple dilated loops of small bowel. No free air is noted. There is a paucity of colonic gas. These changes are consistent with a small bowel obstruction. Multiple surgical changes are seen. No acute bony abnormality is noted.  IMPRESSION: Multiple dilated loops of small bowel consistent with a small bowel obstruction. No other focal abnormality is noted.   Electronically Signed   By: Alcide Clever M.D.   On: 03/04/2013 19:25      Medications: I have reviewed the patient's current medications.  Impression: 1. Small bowel obstruction, seems to be resolving. 2. Crohn's disease with history of strictures in several areas previously. 3. Type 2 diabetes mellitus. 4. Nonalcoholic cirrhosis. 5. Paroxysmal atrial fibrillation, currently in atrial fibrillation. 6. Morbid obesity.     Plan: 1. Continue n.p.o. and IV fluids for the time being until she is reviewed by gastroenterology. 2. KUB x-ray this morning.  Consultants:  Gastroenterology and surgery consultation pending.   Procedures:  None.   Antibiotics:  Intravenous ciprofloxacin started 03/04/2013.  Intravenous metronidazole start 03/04/2013.                   Code Status: Full code.  Family Communication: Discussed plan with patient at the bedside.   Disposition Plan: Home when medically stable.  Time spent: 15 minutes.   LOS: 1 day   Wilson Singer Pager [phone]  03/05/2013, 7:20 AM

## 2013-03-06 DIAGNOSIS — Z7901 Long term (current) use of anticoagulants: Secondary | ICD-10-CM

## 2013-03-06 LAB — BASIC METABOLIC PANEL
BUN: 8 mg/dL (ref 6–23)
CO2: 22 mEq/L (ref 19–32)
Calcium: 8.8 mg/dL (ref 8.4–10.5)
Creatinine, Ser: 0.8 mg/dL (ref 0.50–1.10)
GFR calc Af Amer: 82 mL/min — ABNORMAL LOW (ref >90)
GFR calc non Af Amer: 70 mL/min — ABNORMAL LOW (ref >90)
Glucose, Bld: 172 mg/dL — ABNORMAL HIGH (ref 70–99)

## 2013-03-06 LAB — CBC
Hemoglobin: 10.3 g/dL — ABNORMAL LOW (ref 12.0–15.0)
MCH: 27.7 pg (ref 26.0–34.0)
MCHC: 32 g/dL (ref 30.0–36.0)
MCV: 86.6 fL (ref 78.0–100.0)
RBC: 3.72 MIL/uL — ABNORMAL LOW (ref 3.87–5.11)

## 2013-03-06 LAB — GLUCOSE, CAPILLARY
Glucose-Capillary: 152 mg/dL — ABNORMAL HIGH (ref 70–99)
Glucose-Capillary: 132 mg/dL — ABNORMAL HIGH (ref 70–99)
Glucose-Capillary: 159 mg/dL — ABNORMAL HIGH (ref 70–99)
Glucose-Capillary: 132 mg/dL — ABNORMAL HIGH (ref 70–99)

## 2013-03-06 MED ORDER — PREDNISONE 20 MG PO TABS
30.0000 mg | ORAL_TABLET | Freq: Every day | ORAL | Status: DC
  Administered 2013-03-07: 30 mg via ORAL
  Filled 2013-03-06 (×2): qty 1

## 2013-03-06 MED ORDER — CIPROFLOXACIN HCL 250 MG PO TABS
500.0000 mg | ORAL_TABLET | Freq: Two times a day (BID) | ORAL | Status: DC
  Administered 2013-03-06 – 2013-03-07 (×2): 500 mg via ORAL
  Filled 2013-03-06 (×2): qty 2

## 2013-03-06 MED ORDER — MESALAMINE 400 MG PO CPDR
800.0000 mg | DELAYED_RELEASE_CAPSULE | Freq: Two times a day (BID) | ORAL | Status: DC
  Administered 2013-03-06 – 2013-03-07 (×3): 800 mg via ORAL
  Filled 2013-03-06 (×5): qty 2

## 2013-03-06 NOTE — Progress Notes (Signed)
UR chart review completed.  

## 2013-03-06 NOTE — Care Management Note (Addendum)
    Page 1 of 1   03/07/2013     9:37:03 AM   CARE MANAGEMENT NOTE 03/07/2013  Patient:  [name], [name]   Account Number:  [id number]  Date Initiated:  03/06/2013  Documentation initiated by:  Sharrie Rothman  Subjective/Objective Assessment:   Pt admitted from home with SBO. Pt has a granddaughter that lives with her but the granddaughter is 75 years old. Pt is independent with ADL's. Pt has a cane and walker for home use.     Action/Plan:   No CM needs noted.   Anticipated DC Date:  03/07/2013   Anticipated DC Plan:  HOME/SELF CARE      DC Planning Services  CM consult      Choice offered to / List presented to:             Status of service:  Completed, signed off Medicare Important Message given?  YES (If response is "NO", the following Medicare IM given date fields will be blank) Date Medicare IM given:  03/07/2013 Date Additional Medicare IM given:    Discharge Disposition:  HOME/SELF CARE  Per UR Regulation:    If discussed at Long Length of Stay Meetings, dates discussed:    Comments:  03/07/13 0930 Arlyss Queen, RN BSN CM Pt discharged today. No CM needs noted.  03/06/13 1123 Arlyss Queen, RN BSN CM

## 2013-03-06 NOTE — Progress Notes (Signed)
Patient ID: [name], female   DOB: [birth year], 77 y.o.   MRN: [id number] States she feels pretty good.  No BM this am. She had a soft, runny BM last night. She has passed some flatus. No abdominal pain this morning. Appetite is good. She ate all of diet this am.  No nausea or vomiting.  Hx of Crohn's disease with ileal colonic anastomotic stricture.  She has not needed any pain medication in the past 24 hrs.  O. Alert. Heart rate irregular. Abdomen is obese. Bowel sounds are positive. Umbilical hernia noted.   Filed Vitals:   03/05/13 0458 03/05/13 0509 03/05/13 2030 03/06/13 0354  BP:  101/49 110/68 110/61  Pulse: 103 103 99 112  Temp:  97.3 F (36.3 C) 98 F (36.7 C) 98.2 F (36.8 C)  TempSrc:  Oral Oral Oral  Resp: 18 18 18 18   Height:      Weight:  190 lb 14.7 oz (86.6 kg)  175 lb 4.3 oz (79.5 kg)  SpO2: 91% 97% 93% 92%   I/O last 3 completed shifts: In: 720 [P.O.:720] Out: 500 [Urine:500] Total I/O In: 240 [P.O.:240] Out: -      Assessment/Plan: Crohn's disease/partial bowel obstruction secondary to ileal colonic anastomotic stricture. Noted to have active disease on colonoscopy in March of 2014.  Patient admits to being non-compliant with her Asacol. She usually takes one a day and then will skip a few days.  GI pathogen is pending.  CRP is pending.  Will start Asacol 800mg  BID. CRP is pending.  Will advance diet this evening.

## 2013-03-06 NOTE — Progress Notes (Signed)
Patient is tolerating full liquids. She has not experienced any abdominal pain nausea or vomiting. Diet advance to modified carb diet. Metronidazole discontinued and Cipro transition to oral route which she should continue for another 10 days. Discontinue Solu-Medrol and start prednisone 30 mg by mouth every morning. She will taper by 5 mg every week when she goes home.

## 2013-03-06 NOTE — Progress Notes (Signed)
[facility] PROGRESS NOTE  [name] UUV:[id number] DOB: [birth year] DOA: 03/04/2013 PCP: Rudi Heap, MD  Assessment/Plan: 77 year old Caucasian female with PMH of HTN, DM, PAF, COPD, ileocolonic and rectal Crohn's disease, ileocolonic anastomotic stricture as well as distal rectal stricture s/p dilatation, status post 4 or 5 bowel surgeries, GERD, h/o PUD, cirrhosis secondary to NAFLD admitted with SBO  1. SBO; likely due to partial intestinal obstruction (per GI: anastomotic stricture  -started don IV steroids, IV atx per GI; consider d/c atx  -clinically better, had BM, no vomiting; started diet;   2. Cirrhosis at the present, appears to be well compensated.  3. HTN stable; cont current regimen   4. DM hold metformin; ISS;   5. PAF HR stable; cont current regimen  -monitor INR due to ciprofloxacin   6. COPD stable; cont current regimen     PT eval;   Code Status: full  Family Communication: none at the bedside (indicate person spoken with, relationship, and if by phone, the number) Disposition Plan: pend PT    Consultants:  GI  Procedures:  CT  Antibiotics:  Flagyl, cipro 10/5<<< (indicate start date, and stop date if known)  HPI/Subjective: Alert, feels better   Objective: Filed Vitals:   03/06/13 0918  BP: 109/70  Pulse: 101  Temp: 97.5 F (36.4 C)  Resp: 18    Intake/Output Summary (Last 24 hours) at 03/06/13 1105 Last data filed at 03/06/13 0851  Gross per 24 hour  Intake    960 ml  Output    500 ml  Net    460 ml   Filed Weights   03/05/13 0120 03/05/13 0509 03/06/13 0354  Weight: 87.2 kg (192 lb 3.9 oz) 86.6 kg (190 lb 14.7 oz) 79.5 kg (175 lb 4.3 oz)    Exam:   General:  Alert,   Cardiovascular: s1s2; rrr  Respiratory: cta bl   Abdomen: soft, nt, nd   Musculoskeletal: non pitting edema    Data Reviewed: Basic Metabolic Panel:  Recent Labs Lab 03/04/13 1817 03/05/13 0424 03/06/13 0457  NA 126* 131* 133*   K 4.5 4.7 4.5  CL 92* 97 104  CO2 22 24 22   GLUCOSE 121* 118* 172*  BUN 12 11 8   CREATININE 1.00 0.90 0.80  CALCIUM 10.1 9.1 8.8  MG  --  1.3*  --    Liver Function Tests:  Recent Labs Lab 03/04/13 1817  AST 32  ALT 16  ALKPHOS 101  BILITOT 1.5*  PROT 7.2  ALBUMIN 2.9*    Recent Labs Lab 03/04/13 1817  LIPASE 68*   No results found for this basename: AMMONIA,  in the last 168 hours CBC:  Recent Labs Lab 03/04/13 1817 03/05/13 0424 03/06/13 0457  WBC 7.6 8.5 3.5*  NEUTROABS 5.9  --   --   HGB 12.2 11.2* 10.3*  HCT 36.8 34.3* 32.2*  MCV 84.4 85.3 86.6  PLT 169 171 153   Cardiac Enzymes: No results found for this basename: CKTOTAL, CKMB, CKMBINDEX, TROPONINI,  in the last 168 hours BNP (last 3 results) No results found for this basename: PROBNP,  in the last 8760 hours CBG:  Recent Labs Lab 03/05/13 1647 03/05/13 2043 03/06/13 0013 03/06/13 0356 03/06/13 0753  GLUCAP 61* 126* 147* 152* 132*    No results found for this or any previous visit (from the past 240 hour(s)).   Studies: Dg Abd 1 View  03/05/2013   *RADIOLOGY REPORT*  Clinical Data: Bowel obstruction, abdominal pain  and swelling, subsequent encounter.  ABDOMEN - 1 VIEW  Comparison: 03/04/2013; CT abdomen pelvis - 03/04/2013  Findings:  Redemonstrated marked gaseous distension of a rather features loop of presumed small bowel within the mid hemiabdomen measuring approximately 6 cm in diameter.  There is a minimal amount of distal bowel gas.  Previously administered enteric contrast is seen within the descending colon.  Excreted contrast is seen in the urinary bladder.  Multiple surgical clips are seen within the upper abdomen.  Multilevel thoracolumbar spine degenerative change.  IMPRESSION: Overall findings suggestive of partial small bowel obstruction with marked gaseous distension of a solitary loop of small bowel within the mid hemiabdomen.   Original Report Authenticated By: Tacey Ruiz, MD    Ct Abdomen Pelvis W Contrast  03/04/2013   CLINICAL DATA:  Lower abdominal pain and cramping  EXAM: CT ABDOMEN AND PELVIS WITH CONTRAST  TECHNIQUE: Multidetector CT imaging of the abdomen and pelvis was performed using the standard protocol following bolus administration of intravenous contrast.  CONTRAST:  50mL OMNIPAQUE IOHEXOL 300 MG/ML SOLN, OMNIPAQUE IOHEXOL 300 MG/ML SOLN  COMPARISON:  07/18/2012  FINDINGS: Curvilinear bilateral lower lobe scarring is stable. Trace right pleural fluid or thickening is reidentified. Mild cardiomegaly noted. Superficial probable sebaceous cyst incompletely visualized on the previous exam is noted in the left breast lower inner quadrant, for example image 4.  The liver has a nodular contour with homogeneous internal enhancement. Moderate ascites is present. Spleen size at upper limits of normal with numerous splenorenal collaterals identified. Left mid abdominal clips are present. Adrenal glands and pancreas are normal in appearance. Gallbladder not visualized. The kidneys enhance bilaterally and symmetrically, with areas of probable renal cortical scarring but no hydronephrosis.  Mild hazy infiltration of the small bowel mesentery is identified. There are multiple dilated loops of mid to distal small bowel with a focal transition point in the left mid abdomen image 36, at the location of apparent previous bowel reanastomosis as indicated by a suture line at the apparent ileal colonic anastomotic location. This appearance is similar previously at the anastomosis but there is a new component of bowel dilatation on today's exam. A small amount of fluid tracks along the mesentery. Left ovarian cyst again noted measuring slightly smaller than previously, currently 3.9 x 3.0 cm. Evidence of apparent right hemicolectomy. Subcutaneous vascular collaterals and edema noted. Bladder unremarkable. Uterus and right ovary not visualized.  No acute osseous abnormality.  IMPRESSION:  Small bowel obstruction with focal transition point at apparent ileocolic anastomosis in the left mid abdomen. No free air. Small amount of free fluid noted. Given the history of Crohn's, this could represent recurrent disease or stricture.  Evidence of cirrhosis with portal hypertension and splenomegaly reidentified.   Electronically Signed   By: Christiana Pellant M.D.   On: 03/04/2013 23:24   Dg Abd Acute W/chest  03/04/2013   CLINICAL DATA:  Abdominal pain  EXAM: ACUTE ABDOMEN SERIES (ABDOMEN 2 VIEW & CHEST 1 VIEW)  COMPARISON:  None.  FINDINGS: The heart and pulmonary vascularity are within normal limits. The lungs are clear bilaterally.  The abdomen shows multiple dilated loops of small bowel. No free air is noted. There is a paucity of colonic gas. These changes are consistent with a small bowel obstruction. Multiple surgical changes are seen. No acute bony abnormality is noted.  IMPRESSION: Multiple dilated loops of small bowel consistent with a small bowel obstruction. No other focal abnormality is noted.   Electronically Signed  By: Alcide Clever M.D.   On: 03/04/2013 19:25    Scheduled Meds: . ciprofloxacin  400 mg Intravenous Q12H  . insulin aspart  0-9 Units Subcutaneous Q4H  . Mesalamine  800 mg Oral BID  . methylPREDNISolone (SOLU-MEDROL) injection  30 mg Intravenous Q12H  . metronidazole  500 mg Intravenous Q8H  . pantoprazole (PROTONIX) IV  40 mg Intravenous QHS   Continuous Infusions: . 0.9 % NaCl with KCl 20 mEq / L 100 mL/hr at 03/06/13 0242    Active Problems:   DM   OVERWEIGHT/OBESITY   CROHN'S DISEASE   Cirrhosis, nonalcoholic   Small bowel obstruction    Time spent: > 35 minutes     Esperanza Sheets  [facility] Pager [phone]. If 7PM-7AM, please contact night-coverage at [email or web address], password [facility] 03/06/2013, 11:05 AM  LOS: 2 days

## 2013-03-06 NOTE — Progress Notes (Signed)
Patient IV taken out due to leaking. MD notified and stated to discontinue IV due to possible discharge tomorrow and medications are now PO.

## 2013-03-07 ENCOUNTER — Telehealth: Payer: Self-pay | Admitting: Pharmacist

## 2013-03-07 LAB — GLUCOSE, CAPILLARY
Glucose-Capillary: 120 mg/dL — ABNORMAL HIGH (ref 70–99)
Glucose-Capillary: 118 mg/dL — ABNORMAL HIGH (ref 70–99)
Glucose-Capillary: 100 mg/dL — ABNORMAL HIGH (ref 70–99)

## 2013-03-07 LAB — GI PATHOGEN PANEL BY PCR, STOOL
C difficile toxin A/B: NEGATIVE
E coli (ETEC) LT/ST: NEGATIVE
E coli (STEC): NEGATIVE
E coli 0157 by PCR: NEGATIVE
Salmonella by PCR: NEGATIVE
Shigella by PCR: NEGATIVE

## 2013-03-07 MED ORDER — PREDNISONE 10 MG PO TABS: 10.0000 mg | ORAL_TABLET | Freq: Every day | ORAL | Status: AC

## 2013-03-07 MED ORDER — PREDNISONE 10 MG PO TABS: 30.0000 mg | ORAL_TABLET | Freq: Every day | ORAL | Status: AC

## 2013-03-07 MED ORDER — MESALAMINE 400 MG PO CPDR: 800.0000 mg | DELAYED_RELEASE_CAPSULE | Freq: Two times a day (BID) | ORAL | Status: DC

## 2013-03-07 MED ORDER — PREDNISONE 10 MG PO TABS: 20.0000 mg | ORAL_TABLET | Freq: Every day | ORAL | Status: AC

## 2013-03-07 MED ORDER — CIPROFLOXACIN HCL 500 MG PO TABS: 250.0000 mg | ORAL_TABLET | Freq: Two times a day (BID) | ORAL | Status: DC

## 2013-03-07 MED ORDER — ONDANSETRON 4 MG PO TBDP: 4.0000 mg | ORAL_TABLET | Freq: Three times a day (TID) | ORAL | Status: DC | PRN

## 2013-03-07 NOTE — Discharge Summary (Addendum)
Physician Discharge Summary  [name]:[id number] DOB: 1936/03/05 DOA: 03/04/2013  PCP: Rudi Heap, MD  Admit date: 03/04/2013 Discharge date: 03/07/2013  Time spent: > 35 minutes minutes  Recommendations for Outpatient Follow-up:  F/u with Dr. Karilyn Cota in 10 days F/u with PCP in 3 days to check INR  Discharge Diagnoses:  Active Problems:   DM   OVERWEIGHT/OBESITY   CROHN'S DISEASE   Cirrhosis, nonalcoholic   Small bowel obstruction   Discharge Condition: stable   Diet recommendation: heart healthy   Filed Weights   03/05/13 0509 03/06/13 0354 03/07/13 0454  Weight: 86.6 kg (190 lb 14.7 oz) 79.5 kg (175 lb 4.3 oz) 89.2 kg (196 lb 10.4 oz)    History of present illness:  77 year old Caucasian female with PMH of HTN, DM, PAF, COPD, ileocolonic and rectal Crohn's disease, ileocolonic anastomotic stricture as well as distal rectal stricture s/p dilatation, status post 4 or 5 bowel surgeries, GERD, h/o PUD, cirrhosis secondary to NAFLD admitted with SBO   Hospital Course:  1. SBO; likely due to partial intestinal obstruction (per GI: anastomotic stricture  -resolved; GI started on IV steroids, IV atx per GI; transitioned to PO, with tapering steroids per GI;  -f/u with GI in 10 days   2. Cirrhosis at the present, appears to be well compensated.  3. HTN stable; cont current regimen  4. DM cont current regimen   5. PAF HR stable; cont current regimen  -monitor INR due to ciprofloxacin, INR in 3 days  6. COPD stable; cont current regimen      Procedures:  CT abdomen  (i.e. Studies not automatically included, echos, thoracentesis, etc; not x-rays)  Consultations:  GI  Discharge Exam: Filed Vitals:   03/07/13 0454  BP: 99/53  Pulse: 109  Temp: 98.1 F (36.7 C)  Resp: 19    General: alert  Cardiovascular: s1,s2 rrr Respiratory: CTA BL   Discharge Instructions  Discharge Orders   Future Appointments Provider Department Dept Phone   03/28/2013  9:40 AM [facility] Pharmacist [facility] [phone]   05/02/2013 10:00 AM Malissa Hippo, MD [facility] [phone]   05/10/2013 9:00 AM Ernestina Penna, MD [facility] [phone]   Future Orders Complete By Expires   Diet - low sodium heart healthy  As directed    Discharge instructions  As directed    Comments:     Please follow up with Dr. Karilyn Cota in 10 days Please follow up with PCP in 3 days to check INR   Increase activity slowly  As directed        Medication List    STOP taking these medications       ASACOL HD 800 MG Tbec  Generic drug:  Mesalamine  Replaced by:  Mesalamine 400 MG Cpdr DR capsule      TAKE these medications       acetaminophen 500 MG tablet  Commonly known as:  TYLENOL  Take 500 mg by mouth every 8 (eight) hours as needed. Pain     ALPRAZolam 0.25 MG tablet  Commonly known as:  XANAX  Take 0.375 mg by mouth every morning. Takes one and one-half tablet in the morning     ciprofloxacin 500 MG tablet  Commonly known as:  CIPRO  Take 0.5 tablets (250 mg total) by mouth 2 (two) times daily.     diltiazem 180 MG 24 hr capsule  Commonly known as:  CARDIZEM CD  Take  180 mg by mouth daily.     ferrous sulfate 325 (65 FE) MG tablet  Take 325 mg by mouth. Patient takes this medication 3 times per week     furosemide 20 MG tablet  Commonly known as:  LASIX  Take 1 tablet (20 mg total) by mouth daily. New medication for fluid retention.     gabapentin 100 MG capsule  Commonly known as:  NEURONTIN  Take 100 mg by mouth at bedtime.     lansoprazole 30 MG capsule  Commonly known as:  PREVACID  Take 30 mg by mouth daily as needed (for acid reflux).     levalbuterol 45 MCG/ACT inhaler  Commonly known as:  XOPENEX HFA  Inhale 1-2 puffs into the lungs every 4 (four) hours as needed for wheezing.     Mesalamine 400 MG Cpdr DR capsule  Commonly known as:  ASACOL  Take 2 capsules  (800 mg total) by mouth 2 (two) times daily.     metFORMIN 1000 MG tablet  Commonly known as:  GLUCOPHAGE  Take 1,000 mg by mouth 2 (two) times daily.     multivitamin tablet  Take 1 tablet by mouth daily.     ondansetron 4 MG disintegrating tablet  Commonly known as:  ZOFRAN ODT  Take 1 tablet (4 mg total) by mouth every 8 (eight) hours as needed for nausea.     potassium chloride 10 MEQ tablet  Commonly known as:  K-DUR  Take 10 mEq by mouth every morning.     predniSONE 10 MG tablet  Commonly known as:  DELTASONE  Take 3 tablets (30 mg total) by mouth daily with breakfast.     predniSONE 10 MG tablet  Commonly known as:  DELTASONE  Take 2 tablets (20 mg total) by mouth daily.  Start taking on:  03/15/2013     predniSONE 10 MG tablet  Commonly known as:  DELTASONE  Take 1 tablet (10 mg total) by mouth daily.  Start taking on:  03/22/2013     spironolactone 50 MG tablet  Commonly known as:  ALDACTONE  Take 50 mg by mouth 2 (two) times daily.     vitamin C 500 MG tablet  Commonly known as:  ASCORBIC ACID  Take 500 mg by mouth daily.     Vitamin D (Ergocalciferol) 50000 UNITS Caps capsule  Commonly known as:  DRISDOL  Take 50,000 Units by mouth every Monday.     warfarin 1 MG tablet  Commonly known as:  COUMADIN  Take 1 mg by mouth See admin instructions. Tuesday and Thursday patient takes 1 tablet , the other days she states she takes 1/2 tablet       Allergies  Allergen Reactions  . Sulfonamide Derivatives Other (See Comments)    Felt funny       Follow-up Information   Follow up with REHMAN,NAJEEB U, MD In 10 days.   Specialty:  Gastroenterology   Contact information:   [address], [address] [place] [address] [phone]       Follow up with Rudi Heap, MD In 3 days.   Specialty:  Family Medicine   Contact information:   [address] [place] [address] [phone]        The results of significant diagnostics from this  hospitalization (including imaging, microbiology, ancillary and laboratory) are listed below for reference.    Significant Diagnostic Studies: Dg Abd 1 View  03/05/2013   *RADIOLOGY REPORT*  Clinical  Data: Bowel obstruction, abdominal pain and swelling, subsequent encounter.  ABDOMEN - 1 VIEW  Comparison: 03/04/2013; CT abdomen pelvis - 03/04/2013  Findings:  Redemonstrated marked gaseous distension of a rather features loop of presumed small bowel within the mid hemiabdomen measuring approximately 6 cm in diameter.  There is a minimal amount of distal bowel gas.  Previously administered enteric contrast is seen within the descending colon.  Excreted contrast is seen in the urinary bladder.  Multiple surgical clips are seen within the upper abdomen.  Multilevel thoracolumbar spine degenerative change.  IMPRESSION: Overall findings suggestive of partial small bowel obstruction with marked gaseous distension of a solitary loop of small bowel within the mid hemiabdomen.   Original Report Authenticated By: Tacey Ruiz, MD   Ct Abdomen Pelvis W Contrast  03/04/2013   CLINICAL DATA:  Lower abdominal pain and cramping  EXAM: CT ABDOMEN AND PELVIS WITH CONTRAST  TECHNIQUE: Multidetector CT imaging of the abdomen and pelvis was performed using the standard protocol following bolus administration of intravenous contrast.  CONTRAST:  50mL OMNIPAQUE IOHEXOL 300 MG/ML SOLN, OMNIPAQUE IOHEXOL 300 MG/ML SOLN  COMPARISON:  07/18/2012  FINDINGS: Curvilinear bilateral lower lobe scarring is stable. Trace right pleural fluid or thickening is reidentified. Mild cardiomegaly noted. Superficial probable sebaceous cyst incompletely visualized on the previous exam is noted in the left breast lower inner quadrant, for example image 4.  The liver has a nodular contour with homogeneous internal enhancement. Moderate ascites is present. Spleen size at upper limits of normal with numerous splenorenal collaterals identified. Left  mid abdominal clips are present. Adrenal glands and pancreas are normal in appearance. Gallbladder not visualized. The kidneys enhance bilaterally and symmetrically, with areas of probable renal cortical scarring but no hydronephrosis.  Mild hazy infiltration of the small bowel mesentery is identified. There are multiple dilated loops of mid to distal small bowel with a focal transition point in the left mid abdomen image 36, at the location of apparent previous bowel reanastomosis as indicated by a suture line at the apparent ileal colonic anastomotic location. This appearance is similar previously at the anastomosis but there is a new component of bowel dilatation on today's exam. A small amount of fluid tracks along the mesentery. Left ovarian cyst again noted measuring slightly smaller than previously, currently 3.9 x 3.0 cm. Evidence of apparent right hemicolectomy. Subcutaneous vascular collaterals and edema noted. Bladder unremarkable. Uterus and right ovary not visualized.  No acute osseous abnormality.  IMPRESSION: Small bowel obstruction with focal transition point at apparent ileocolic anastomosis in the left mid abdomen. No free air. Small amount of free fluid noted. Given the history of Crohn's, this could represent recurrent disease or stricture.  Evidence of cirrhosis with portal hypertension and splenomegaly reidentified.   Electronically Signed   By: Christiana Pellant M.D.   On: 03/04/2013 23:24   Dg Abd Acute W/chest  03/04/2013   CLINICAL DATA:  Abdominal pain  EXAM: ACUTE ABDOMEN SERIES (ABDOMEN 2 VIEW & CHEST 1 VIEW)  COMPARISON:  None.  FINDINGS: The heart and pulmonary vascularity are within normal limits. The lungs are clear bilaterally.  The abdomen shows multiple dilated loops of small bowel. No free air is noted. There is a paucity of colonic gas. These changes are consistent with a small bowel obstruction. Multiple surgical changes are seen. No acute bony abnormality is noted.   IMPRESSION: Multiple dilated loops of small bowel consistent with a small bowel obstruction. No other focal abnormality is noted.  Electronically Signed   By: Alcide Clever M.D.   On: 03/04/2013 19:25    Microbiology: No results found for this or any previous visit (from the past 240 hour(s)).   Labs: Basic Metabolic Panel:  Recent Labs Lab 03/04/13 1817 03/05/13 0424 03/06/13 0457  NA 126* 131* 133*  K 4.5 4.7 4.5  CL 92* 97 104  CO2 22 24 22   GLUCOSE 121* 118* 172*  BUN 12 11 8   CREATININE 1.00 0.90 0.80  CALCIUM 10.1 9.1 8.8  MG  --  1.3*  --    Liver Function Tests:  Recent Labs Lab 03/04/13 1817  AST 32  ALT 16  ALKPHOS 101  BILITOT 1.5*  PROT 7.2  ALBUMIN 2.9*    Recent Labs Lab 03/04/13 1817  LIPASE 68*   No results found for this basename: AMMONIA,  in the last 168 hours CBC:  Recent Labs Lab 03/04/13 1817 03/05/13 0424 03/06/13 0457  WBC 7.6 8.5 3.5*  NEUTROABS 5.9  --   --   HGB 12.2 11.2* 10.3*  HCT 36.8 34.3* 32.2*  MCV 84.4 85.3 86.6  PLT 169 171 153   Cardiac Enzymes: No results found for this basename: CKTOTAL, CKMB, CKMBINDEX, TROPONINI,  in the last 168 hours BNP: BNP (last 3 results) No results found for this basename: PROBNP,  in the last 8760 hours CBG:  Recent Labs Lab 03/06/13 2006 03/07/13 0011 03/07/13 0443 03/07/13 0534 03/07/13 0748  GLUCAP 132* 134* 120* 118* 100*       Signed:  Hartley Urton N  [facility] 03/07/2013, 8:34 AM    Addendum: Small bowel obstruction due to Crohn's disease based on previous colonoscopy results  Danyela Posas N  03/09/13

## 2013-03-07 NOTE — Progress Notes (Signed)
Subjective; Patient has no complaints. She denies nausea vomiting or abdominal pain. She's had 3 stools in the last 12 hours; consistency is mushy. No melena or rectal bleeding reported.  Objective; BP 99/53  Pulse 109  Temp(Src) 98.1 F (36.7 C) (Oral)  Resp 19  Ht 5' (1.524 m)  Wt 196 lb 10.4 oz (89.2 kg)  BMI 38.41 kg/m2  SpO2 96% Abdomen is full. Normal bowel sounds.Small umbilicus hernia which is soft and reducible. Mild tenderness in left upper quadrant with firm area. It is less firm today. Extremities with nonpitting pretibial edema.  Lab data; C. reactive protein from yesterday mildly elevated at 1.5. GI pathogen panel is pending. Last for glucose levels are 134, 120, 118 and 100.  Assessment; Relapse of Crohn's with partial small bowel obstruction responding to medical therapy. She has not been compliant in taking oral mesalamine. She is tolerating steroids without significant bump in her glucose levels. Prednisone will be tapered at a rate of 5 mg every week. As discussed with Dr. York Spaniel, she will need to be on half the dose of warfarin since she will be on Cipro for the next 10 days. She will be seen in the office in 2 weeks.

## 2013-03-07 NOTE — Progress Notes (Signed)
NURSING PROGRESS NOTE  [name] [id number] Discharge Data: 03/07/2013 11:32 AM Attending Provider: Esperanza Sheets, MD WUJ:[id number], Dorinda Hill, MD     Lenise Arena to be D/C'd Home per MD order.  Discussed with the patient and daughter the After Visit Summary and all questions fully answered. All IV's discontinued with no bleeding noted. All belongings returned to patient for patient to take home.   Last Vital Signs:  Blood pressure 112/63, pulse 99, temperature 98 F (36.7 C), temperature source Oral, resp. rate 20, height 5' (1.524 m), weight 89.2 kg (196 lb 10.4 oz), SpO2 97.00%.  Discharge Medication List   Medication List    STOP taking these medications       ASACOL HD 800 MG Tbec  Generic drug:  Mesalamine  Replaced by:  Mesalamine 400 MG Cpdr DR capsule      TAKE these medications       acetaminophen 500 MG tablet  Commonly known as:  TYLENOL  Take 500 mg by mouth every 8 (eight) hours as needed. Pain     ALPRAZolam 0.25 MG tablet  Commonly known as:  XANAX  Take 0.375 mg by mouth every morning. Takes one and one-half tablet in the morning     ciprofloxacin 500 MG tablet  Commonly known as:  CIPRO  Take 0.5 tablets (250 mg total) by mouth 2 (two) times daily.     diltiazem 180 MG 24 hr capsule  Commonly known as:  CARDIZEM CD  Take 180 mg by mouth daily.     ferrous sulfate 325 (65 FE) MG tablet  Take 325 mg by mouth. Patient takes this medication 3 times per week     furosemide 20 MG tablet  Commonly known as:  LASIX  Take 1 tablet (20 mg total) by mouth daily. New medication for fluid retention.     gabapentin 100 MG capsule  Commonly known as:  NEURONTIN  Take 100 mg by mouth at bedtime.     lansoprazole 30 MG capsule  Commonly known as:  PREVACID  Take 30 mg by mouth daily as needed (for acid reflux).     levalbuterol 45 MCG/ACT inhaler  Commonly known as:  XOPENEX HFA  Inhale 1-2 puffs into the lungs every 4 (four) hours as needed for  wheezing.     Mesalamine 400 MG Cpdr DR capsule  Commonly known as:  ASACOL  Take 2 capsules (800 mg total) by mouth 2 (two) times daily.     metFORMIN 1000 MG tablet  Commonly known as:  GLUCOPHAGE  Take 1,000 mg by mouth 2 (two) times daily.     multivitamin tablet  Take 1 tablet by mouth daily.     ondansetron 4 MG disintegrating tablet  Commonly known as:  ZOFRAN ODT  Take 1 tablet (4 mg total) by mouth every 8 (eight) hours as needed for nausea.     potassium chloride 10 MEQ tablet  Commonly known as:  K-DUR  Take 10 mEq by mouth every morning.     predniSONE 10 MG tablet  Commonly known as:  DELTASONE  Take 3 tablets (30 mg total) by mouth daily with breakfast.     predniSONE 10 MG tablet  Commonly known as:  DELTASONE  Take 2 tablets (20 mg total) by mouth daily.  Start taking on:  03/15/2013     predniSONE 10 MG tablet  Commonly known as:  DELTASONE  Take 1 tablet (10 mg total) by mouth daily.  Start  taking on:  03/22/2013     spironolactone 50 MG tablet  Commonly known as:  ALDACTONE  Take 50 mg by mouth 2 (two) times daily.     vitamin C 500 MG tablet  Commonly known as:  ASCORBIC ACID  Take 500 mg by mouth daily.     Vitamin D (Ergocalciferol) 50000 UNITS Caps capsule  Commonly known as:  DRISDOL  Take 50,000 Units by mouth every Monday.     warfarin 1 MG tablet  Commonly known as:  COUMADIN  Take 1 mg by mouth See admin instructions. Tuesday and Thursday patient takes 1 tablet , the other days she states she takes 1/2 tablet

## 2013-03-08 NOTE — Telephone Encounter (Signed)
Patient to start prednisone for 2-3 weeks today.  Instructed to decrease warfarin dose from 0.5mg  daily except 1mg  on tuesdays and thursdays to 0.5mg  daily except none on fridays.  Appt to check protime 03/11/13.

## 2013-03-12 ENCOUNTER — Other Ambulatory Visit: Payer: Self-pay | Admitting: Family Medicine

## 2013-03-13 ENCOUNTER — Other Ambulatory Visit: Payer: Self-pay | Admitting: Physician Assistant

## 2013-03-14 NOTE — Telephone Encounter (Signed)
Last Vit D level 11/22/12  Low  23  DWM

## 2013-03-15 ENCOUNTER — Ambulatory Visit (INDEPENDENT_AMBULATORY_CARE_PROVIDER_SITE_OTHER): Payer: Medicare Other | Admitting: Pharmacist

## 2013-03-15 DIAGNOSIS — I4891 Unspecified atrial fibrillation: Secondary | ICD-10-CM

## 2013-03-15 NOTE — Patient Instructions (Signed)
Anticoagulation Dose Instructions as of 03/15/2013     [name] Tue Wed Thu Fri Sat   New Dose 0.5 mg 0.5 mg 1 mg 0.5 mg 1 mg 0.5 mg 0.5 mg    Description       Continue same dose of 1/2 tablet daily except none on Fridays until finished with prednisone.  Once off prednisone restart usual dose of 1/2 tablet daily except 1 tablet on tuesdays and thursdays.      INR was 2.2 today

## 2013-03-17 ENCOUNTER — Other Ambulatory Visit: Payer: Self-pay | Admitting: Family Medicine

## 2013-03-28 ENCOUNTER — Encounter (INDEPENDENT_AMBULATORY_CARE_PROVIDER_SITE_OTHER): Payer: Self-pay | Admitting: Internal Medicine

## 2013-03-28 ENCOUNTER — Ambulatory Visit (INDEPENDENT_AMBULATORY_CARE_PROVIDER_SITE_OTHER): Payer: Medicare Other | Admitting: Internal Medicine

## 2013-03-28 VITALS — BP 110/70 | HR 68 | Temp 96.8°F | Resp 18 | Ht 59.0 in | Wt 185.4 lb

## 2013-03-28 DIAGNOSIS — K219 Gastro-esophageal reflux disease without esophagitis: Secondary | ICD-10-CM

## 2013-03-28 DIAGNOSIS — K7460 Unspecified cirrhosis of liver: Secondary | ICD-10-CM

## 2013-03-28 DIAGNOSIS — K5080 Crohn's disease of both small and large intestine without complications: Secondary | ICD-10-CM

## 2013-03-28 MED ORDER — MESALAMINE ER 500 MG PO CPCR: 500.0000 mg | ORAL_CAPSULE | Freq: Three times a day (TID) | ORAL | Status: DC

## 2013-03-28 MED ORDER — MESALAMINE ER 500 MG PO CPCR: 1000.0000 mg | ORAL_CAPSULE | Freq: Three times a day (TID) | ORAL | Status: AC

## 2013-03-28 NOTE — Patient Instructions (Addendum)
Notify if abdominal pain recurs or you have rectal bleeding. Can go back to Asacol and Pentasa as samples run out. Blood work to be done one day before office visit

## 2013-03-28 NOTE — Progress Notes (Signed)
Presenting complaint;  Followup for Crohn's disease.  Subjective:  Patient is 77 year old Caucasian female with history of ileal colonic and rectal Crohn's disease who was hospitalized earlier this month for small bowel obstruction. Level of obstruction was felt to be at the ileocolonic anastomosis which has been dilated in the past. It turns out she was not taking mesalamine as recommended and she was also under a lot of stress having to care for her teenage granddaughter. She responded to medical therapy. She tapered her prednisone rapidly and took final dose end of last week. She denies nausea vomiting melena or rectal bleeding. She is having anywhere from 2-6 loose or soft stools daily. She has fleeting pain at RUQ and hypogastric area. She states she is getting her strength back. She felt very weak for 2 weeks after leaving the hospital. She believes prednisone made her nervous. She feel her stress level is down since she is not responsible for her granddaughter anymore.  Current Medications: Acetaminophen 500 mg by mouth 3 times a day when necessary. Alprazolam 0.375 grams by mouth every morning. Diltiazem 180 mg by mouth daily. Ferrous sulfate 325 mg by mouth 3 times a week. Furosemide 20 mg by mouth daily. Elephantine 100 mg by mouth each bedtime. Lansoprazole 30 mg by mouth every morning. Levalbuterol one to 2 puffs every 4 hours as needed. Metformin 1 g by mouth twice a day. Kay Dur 10 mg by mouth daily. Spironolactone 50 mg by mouth twice a day. Vitamin C 500 mg by mouth daily. Vitamin C 50,000 units by mouth every week. Warfarin 2 mg daily except 1 mg every Wednesday and Saturday.         Objective: Blood pressure 110/70, pulse 68, temperature 96.8 F (36 C), temperature source Oral, resp. rate 18, height 4\' 11"  (1.499 m), weight 185 lb 6.4 oz (84.097 kg). Patient is alert and does not have asterixis. Conjunctiva is pink. Sclera is nonicteric Oropharyngeal mucosa is  normal. No neck masses or thyromegaly noted. Cardiac exam reveals irregular rhythm, normal S1 and S2. No murmur or gallop noted. Lungs are clear to auscultation. Abdomen is full. Bowel sounds are normal. She has small umbilicus hernia which is reducible and nontender. Illness noted below the left costal margin without tenderness. No masses organomegaly. She has nonpitting pretibial edema involving both legs and multiple keratosis.    Assessment:  #1. Relapse of Crohn's disease with small bowel obstruction at the level of ileocolonic anastomosis responding to medical therapy. Given location of her disease Pentasa  would be better drug for her as it is bioavailable and small intestine. Cost however may be an issue. #2. Cirrhosis secondary to NAFLD. Hepatic function remains compensated. HCC screening is up-to-date. #3. GERD. Symptoms well-controlled with therapy.    Plan:  Discontinue Asacol. Pentasa 1 g by mouth 3 times a day. Samples given to the patient. Office visit in 2 months. Patient will have CBC, comprehensive chemistry panel and CRP prior to next visit.

## 2013-03-29 ENCOUNTER — Telehealth (INDEPENDENT_AMBULATORY_CARE_PROVIDER_SITE_OTHER): Payer: Self-pay | Admitting: *Deleted

## 2013-03-29 DIAGNOSIS — K5090 Crohn's disease, unspecified, without complications: Secondary | ICD-10-CM

## 2013-03-29 NOTE — Telephone Encounter (Signed)
Per Dr.Rehman the patient will need to have labs drawn in 2 months. Patient is requesting that the order be sent to Dr.Moore , so that she can have labs drawn there.

## 2013-04-04 ENCOUNTER — Ambulatory Visit (INDEPENDENT_AMBULATORY_CARE_PROVIDER_SITE_OTHER): Payer: Medicare Other | Admitting: Pharmacist Clinician (PhC)/ Clinical Pharmacy Specialist

## 2013-04-04 DIAGNOSIS — Z23 Encounter for immunization: Secondary | ICD-10-CM

## 2013-04-04 DIAGNOSIS — I4891 Unspecified atrial fibrillation: Secondary | ICD-10-CM

## 2013-04-04 LAB — POCT INR: INR: 2

## 2013-04-10 ENCOUNTER — Other Ambulatory Visit: Payer: Self-pay

## 2013-04-10 NOTE — Telephone Encounter (Signed)
Last seen 11/22/12  B [place]    If approved route to nurse to call into [facility]

## 2013-04-11 MED ORDER — ALPRAZOLAM 0.25 MG PO TABS: 0.3750 mg | ORAL_TABLET | Freq: Every morning | ORAL | Status: DC

## 2013-04-19 ENCOUNTER — Encounter (INDEPENDENT_AMBULATORY_CARE_PROVIDER_SITE_OTHER): Payer: Self-pay | Admitting: *Deleted

## 2013-04-19 ENCOUNTER — Other Ambulatory Visit (INDEPENDENT_AMBULATORY_CARE_PROVIDER_SITE_OTHER): Payer: Self-pay | Admitting: *Deleted

## 2013-04-19 DIAGNOSIS — K5080 Crohn's disease of both small and large intestine without complications: Secondary | ICD-10-CM

## 2013-04-19 DIAGNOSIS — K7460 Unspecified cirrhosis of liver: Secondary | ICD-10-CM

## 2013-04-19 DIAGNOSIS — K5090 Crohn's disease, unspecified, without complications: Secondary | ICD-10-CM

## 2013-05-02 ENCOUNTER — Ambulatory Visit (INDEPENDENT_AMBULATORY_CARE_PROVIDER_SITE_OTHER): Payer: Medicare Other | Admitting: Internal Medicine

## 2013-05-10 ENCOUNTER — Telehealth: Payer: Self-pay | Admitting: Pharmacist

## 2013-05-10 ENCOUNTER — Ambulatory Visit (INDEPENDENT_AMBULATORY_CARE_PROVIDER_SITE_OTHER): Payer: Medicare Other | Admitting: Family Medicine

## 2013-05-10 NOTE — Telephone Encounter (Signed)
Last A1c was 03/05/2013 in hospital.  Next due 06/05/2013. Advised patient to wait until they reschedule appt with Dr Christell Constant to have A1c rechecked.

## 2013-05-11 ENCOUNTER — Other Ambulatory Visit: Payer: Self-pay | Admitting: Family Medicine

## 2013-05-11 ENCOUNTER — Telehealth: Payer: Self-pay | Admitting: Family Medicine

## 2013-05-11 NOTE — Telephone Encounter (Signed)
Pt aware this was cut back a little

## 2013-05-12 ENCOUNTER — Other Ambulatory Visit: Payer: Self-pay

## 2013-05-12 NOTE — Telephone Encounter (Signed)
Last seen and last Vit D level 11/22/12  Low  23  DWM

## 2013-05-22 LAB — CBC
Hemoglobin: 11.1 g/dL — ABNORMAL LOW (ref 12.0–15.0)
MCH: 26.7 pg (ref 26.0–34.0)
MCHC: 31.9 g/dL (ref 30.0–36.0)
MCV: 83.9 fL (ref 78.0–100.0)
RBC: 4.15 MIL/uL (ref 3.87–5.11)

## 2013-05-22 LAB — COMPREHENSIVE METABOLIC PANEL
ALT: 12 U/L (ref 0–35)
AST: 22 U/L (ref 0–37)
Albumin: 2.7 g/dL — ABNORMAL LOW (ref 3.5–5.2)
Alkaline Phosphatase: 92 U/L (ref 39–117)
BUN: 8 mg/dL (ref 6–23)
Calcium: 8.9 mg/dL (ref 8.4–10.5)
Chloride: 103 mEq/L (ref 96–112)
Potassium: 4.6 mEq/L (ref 3.5–5.3)
Sodium: 135 mEq/L (ref 135–145)
Total Bilirubin: 1.1 mg/dL (ref 0.3–1.2)
Total Protein: 6 g/dL (ref 6.0–8.3)

## 2013-05-22 LAB — HIGH SENSITIVITY CRP: CRP, High Sensitivity: 3.3 mg/L — ABNORMAL HIGH

## 2013-05-23 ENCOUNTER — Encounter (INDEPENDENT_AMBULATORY_CARE_PROVIDER_SITE_OTHER): Payer: Self-pay | Admitting: Internal Medicine

## 2013-05-23 ENCOUNTER — Ambulatory Visit (INDEPENDENT_AMBULATORY_CARE_PROVIDER_SITE_OTHER): Payer: Medicare Other | Admitting: Internal Medicine

## 2013-05-23 VITALS — BP 116/72 | HR 78 | Temp 97.6°F | Resp 18 | Ht 59.0 in | Wt 194.8 lb

## 2013-05-23 DIAGNOSIS — K5080 Crohn's disease of both small and large intestine without complications: Secondary | ICD-10-CM

## 2013-05-23 DIAGNOSIS — D649 Anemia, unspecified: Secondary | ICD-10-CM

## 2013-05-23 DIAGNOSIS — K7460 Unspecified cirrhosis of liver: Secondary | ICD-10-CM

## 2013-05-23 NOTE — Patient Instructions (Signed)
Next liver ultrasound would be in April 2015. Call if you have abdominal pain or swallowing difficulty

## 2013-05-23 NOTE — Progress Notes (Signed)
Presenting complaint;  Followup for Crohn's disease of small and large intestine.  Subjective:  Patient is 77 year old Caucasian female who has history of small and large bowel Crohn's disease with ileocolonic and anorectal strictures who was admitted to [facility] about 10 weeks ago for small bowel obstruction most likely secondary to relapse of Crohn's disease. She had quit taking mesalamine and she was also under a lot of stress due to family issues. She responded to medical therapy. She was last seen 8 weeks ago. She is taking Pentasa as prescribed. She continues to feel better. She has not experienced any episode of abdominal pain nausea vomiting or heartburn. She is having 1-2 in caliber formed stools daily. She denies melena or rectal bleeding. She has gained 9 pounds since her last visit she does not report increasing lower extremity edema. She states heartburn is well controlled with therapy and she denies dysphagia.  Current Medications:   Medication Sig Dispense Refill  . acetaminophen (TYLENOL) 500 MG tablet Take 500 mg by mouth every 8 (eight) hours as needed. Pain      . ALPRAZolam (XANAX) 0.25 MG tablet Take 1.5 tablets (0.375 mg total) by mouth every morning. Takes one and one-half tablet in the morning  45 tablet  0  . diltiazem (CARDIZEM CD) 180 MG 24 hr capsule Take 180 mg by mouth daily.      . ferrous sulfate 325 (65 FE) MG tablet Take 325 mg by mouth. Patient takes this medication 3 times per week      . furosemide (LASIX) 20 MG tablet Take 1 tablet (20 mg total) by mouth daily. New medication for fluid retention.  30 tablet  3  . gabapentin (NEURONTIN) 100 MG capsule Take 100 mg by mouth at bedtime.      . lansoprazole (PREVACID) 30 MG capsule Take 30 mg by mouth daily as needed (for acid reflux).       Marland Kitchen levalbuterol (XOPENEX HFA) 45 MCG/ACT inhaler Inhale 1-2 puffs into the lungs every 4 (four) hours as needed for wheezing.  1 Inhaler  12  . mesalamine (PENTASA) 500 MG CR  capsule Take 2 capsules (1,000 mg total) by mouth 3 (three) times daily.  180 capsule  2  . metFORMIN (GLUCOPHAGE) 1000 MG tablet TAKE ONE TABLET BY MOUTH TWICE DAILY  60 tablet  2  . Multiple Vitamin (MULTIVITAMIN) tablet Take 1 tablet by mouth daily.        . ondansetron (ZOFRAN ODT) 4 MG disintegrating tablet Take 1 tablet (4 mg total) by mouth every 8 (eight) hours as needed for nausea.  20 tablet  0  . potassium chloride (K-DUR) 10 MEQ tablet Take 10 mEq by mouth every morning.      Marland Kitchen spironolactone (ALDACTONE) 50 MG tablet Take 50 mg by mouth 2 (two) times daily.      . vitamin C (ASCORBIC ACID) 500 MG tablet Take 500 mg by mouth daily.      . Vitamin D, Ergocalciferol, (DRISDOL) 50000 UNITS CAPS capsule TAKE ONE CAPSULE BY MOUTH WEEKLY  12 capsule  0  . warfarin (COUMADIN) 1 MG tablet TAKE 2 TABLETS BY MOUTH DAILY EXCEPT ON WEDNESDAYS AND SATURDAYS ONLY TAKE 1 TABLET DAILY  60 tablet  2  . [DISCONTINUED] potassium chloride (K-DUR,KLOR-CON) 10 MEQ tablet TAKE 1 TABLET BY MOUTH ONCE A DAY WITH FUROSEMIDE (LASIX) DO NOTCRUSH  30 tablet  0        Objective: Blood pressure 116/72, pulse 78, temperature 97.6 F (36.4  C), temperature source Oral, resp. rate 18, height 4\' 11"  (1.499 m), weight 194 lb 12.8 oz (88.361 kg). Patient is alert and in no acute distress. Conjunctiva is pink. Sclera is nonicteric Oropharyngeal mucosa is normal. No neck masses or thyromegaly noted. Cardiac exam with irregular rhythm normal S1 and S2. No murmur or gallop noted. Lungs are clear to auscultation. Abdomen is full with the umbilicus hernia which is reducible. Abdomen is soft with fullness below the left costal margin but this area is not tender anymore. No organomegaly or masses.  She has nonpitting bilateral pretibial edema. She has generalized keratosis.  Labs/studies Results: Lab data from 05/22/2013.  WBC 4.7, H&H 11.1 and 34.8 and platelet count 198K  CRP less than 0.5. High sensitivity CRP was  3.3(this test was not requested). Electrolytes within normal limits. BUN 8, creatinine 0.81, glucose 113. Bilirubin one dog one, AP 92, AST 22, ALT 12, albumin 2.7 and calcium 8.9. CT from 03/04/2013 revealed a nodular liver without suspicious areas and ascites.      Assessment:  #1. Crohn's disease involving small and large bowel with a stenotic and anorectal strictures. Recent bout of small bowel obstruction most likely secondary to relapse of her Crohn's disease. CRP is normal. High sensitivity CRP is elevated but this test was not ordered. Patient understands need for compliance with Pentasa. #2. Mild anemia. Hemoglobin was 10.3 g 10 weeks ago and now it is 11.1. #3. Cirrhosis secondary to NAFLD. Serum albumin is low but transaminases are normal. CT in October 2014 revealed ascites probably related to acute illness. She is up-to-date on screening for HCC. #4. GERD. Symptoms well controlled with therapy.   Plan:  Continue lansoprazole and Pentasa at current dose. Patient will have AFP and hepatic ultrasound in April 2015. Office visit in 6 months.

## 2013-05-30 ENCOUNTER — Telehealth (INDEPENDENT_AMBULATORY_CARE_PROVIDER_SITE_OTHER): Payer: Self-pay | Admitting: *Deleted

## 2013-05-30 ENCOUNTER — Encounter: Payer: Self-pay | Admitting: Family Medicine

## 2013-05-30 ENCOUNTER — Ambulatory Visit (INDEPENDENT_AMBULATORY_CARE_PROVIDER_SITE_OTHER): Payer: Medicare Other | Admitting: Family Medicine

## 2013-05-30 VITALS — BP 121/72 | HR 108 | Temp 97.3°F | Ht 59.0 in | Wt 191.0 lb

## 2013-05-30 DIAGNOSIS — E119 Type 2 diabetes mellitus without complications: Secondary | ICD-10-CM

## 2013-05-30 DIAGNOSIS — I10 Essential (primary) hypertension: Secondary | ICD-10-CM

## 2013-05-30 DIAGNOSIS — E663 Overweight: Secondary | ICD-10-CM

## 2013-05-30 DIAGNOSIS — K5090 Crohn's disease, unspecified, without complications: Secondary | ICD-10-CM

## 2013-05-30 DIAGNOSIS — J449 Chronic obstructive pulmonary disease, unspecified: Secondary | ICD-10-CM

## 2013-05-30 DIAGNOSIS — D649 Anemia, unspecified: Secondary | ICD-10-CM

## 2013-05-30 DIAGNOSIS — I4891 Unspecified atrial fibrillation: Secondary | ICD-10-CM

## 2013-05-30 DIAGNOSIS — D235 Other benign neoplasm of skin of trunk: Secondary | ICD-10-CM

## 2013-05-30 DIAGNOSIS — Z780 Asymptomatic menopausal state: Secondary | ICD-10-CM

## 2013-05-30 DIAGNOSIS — E559 Vitamin D deficiency, unspecified: Secondary | ICD-10-CM

## 2013-05-30 MED ORDER — ALPRAZOLAM 0.5 MG PO TABS: ORAL_TABLET | ORAL | Status: DC

## 2013-05-30 NOTE — Progress Notes (Signed)
Subjective:    Patient ID: [name], female    DOB: 1936-01-12, 77 y.o.   MRN: [id number]  HPI Pt here for follow up and management of chronic and multiple medical problems. She sees the cardiologist and the gastroenterologist on a regular basis. The patient takes a lot of medications and has no specific complaints today. She is pleasant and cooperative and in good spirits today.       Patient Active Problem List   Diagnosis Date Noted  . Small bowel obstruction 03/05/2013  . High risk medication use 09/15/2012  . Chronic respiratory failure 02/15/2012  . Pleural effusion 02/13/2012  . RVH (right ventricular hypertrophy) 02/13/2012  . Anemia 02/13/2012  . Acute respiratory failure with hypercapnia 02/13/2012  . Hypoxia 02/13/2012  . Debility 02/10/2012  . Hypokalemia 02/10/2012  . CAP (community acquired pneumonia) 02/10/2012  . Dyspnea 02/10/2012  . Venous stasis 02/10/2012  . COPD exacerbation 02/10/2012  . Hypoalbuminemia 02/10/2012  . Anticoagulant long-term use 02/10/2012  . Cirrhosis, nonalcoholic 07/06/2011  . Cough 05/05/2011  . COPD (chronic obstructive pulmonary disease) 05/05/2011  . Edema 02/18/2011  . Tobacco abuse 02/18/2011  . DM 08/30/2008  . OVERWEIGHT/OBESITY 08/30/2008  . ANXIETY 08/30/2008  . BLINDNESS, LEFT EYE 08/30/2008  . HYPERTENSION, UNSPECIFIED 08/30/2008  . Atrial fibrillation 08/30/2008  . CROHN'S DISEASE 08/30/2008   Outpatient Encounter Prescriptions as of 05/30/2013  Medication Sig  . acetaminophen (TYLENOL) 500 MG tablet Take 500 mg by mouth every 8 (eight) hours as needed. Pain  . ALPRAZolam (XANAX) 0.25 MG tablet Take 1.5 tablets (0.375 mg total) by mouth every morning. Takes one and one-half tablet in the morning  . diltiazem (CARDIZEM CD) 180 MG 24 hr capsule Take 180 mg by mouth daily.  . ferrous sulfate 325 (65 FE) MG tablet Take 325 mg by mouth. Patient takes this medication 3 times per week  . furosemide (LASIX) 20 MG  tablet Take 1 tablet (20 mg total) by mouth daily. New medication for fluid retention.  . gabapentin (NEURONTIN) 100 MG capsule Take 100 mg by mouth at bedtime.  . lansoprazole (PREVACID) 30 MG capsule Take 30 mg by mouth daily as needed (for acid reflux).   Marland Kitchen levalbuterol (XOPENEX HFA) 45 MCG/ACT inhaler Inhale 1-2 puffs into the lungs every 4 (four) hours as needed for wheezing.  . mesalamine (PENTASA) 500 MG CR capsule Take 2 capsules (1,000 mg total) by mouth 3 (three) times daily.  . metFORMIN (GLUCOPHAGE) 1000 MG tablet TAKE ONE TABLET BY MOUTH TWICE DAILY  . Multiple Vitamin (MULTIVITAMIN) tablet Take 1 tablet by mouth daily.    . ondansetron (ZOFRAN ODT) 4 MG disintegrating tablet Take 1 tablet (4 mg total) by mouth every 8 (eight) hours as needed for nausea.  . potassium chloride (K-DUR) 10 MEQ tablet Take 10 mEq by mouth every morning.  Marland Kitchen spironolactone (ALDACTONE) 50 MG tablet Take 50 mg by mouth 2 (two) times daily.  . vitamin C (ASCORBIC ACID) 500 MG tablet Take 500 mg by mouth daily.  . Vitamin D, Ergocalciferol, (DRISDOL) 50000 UNITS CAPS capsule TAKE ONE CAPSULE BY MOUTH WEEKLY  . warfarin (COUMADIN) 1 MG tablet TAKE 2 TABLETS BY MOUTH DAILY EXCEPT ON WEDNESDAYS AND SATURDAYS ONLYTAKE 1 TABLET DAILY    Review of Systems  Constitutional: Negative.   HENT: Negative.   Eyes: Negative.   Respiratory: Negative.   Cardiovascular: Negative.   Gastrointestinal: Negative.   Endocrine: Negative.   Genitourinary: Negative.   Musculoskeletal: Negative.  Skin: Negative.   Allergic/Immunologic: Negative.   Neurological: Negative.   Hematological: Negative.   Psychiatric/Behavioral: Negative.        Objective:   Physical Exam  Nursing note and vitals reviewed. Constitutional: She is oriented to person, place, and time. She appears well-developed and well-nourished. No distress.  Overweight  HENT:  Head: Normocephalic and atraumatic.  Right Ear: External ear normal.  Left  Ear: External ear normal.  Nose: Nose normal.  Mouth/Throat: Oropharynx is clear and moist.  Eyes: Conjunctivae and EOM are normal. Pupils are equal, round, and reactive to light. Right eye exhibits no discharge. Left eye exhibits no discharge. No scleral icterus.  Neck: Normal range of motion. Neck supple. No thyromegaly present.  Cardiovascular: Normal heart sounds and intact distal pulses.  Exam reveals no gallop and no friction rub.   No murmur heard. Tachycardic at 108 per minute with an irregular irregular rhythm of atrial fibrillation  Pulmonary/Chest: Effort normal and breath sounds normal. No respiratory distress. She has no wheezes. She has no rales. She exhibits no tenderness.  Abdominal: Soft. Bowel sounds are normal. She exhibits no mass. There is no tenderness. There is no rebound and no guarding.  Musculoskeletal: Normal range of motion. She exhibits no edema and no tenderness.  Lymphadenopathy:    She has no cervical adenopathy.  Neurological: She is alert and oriented to person, place, and time. She has normal reflexes. No cranial nerve deficit.  Skin: Skin is warm and dry.  Multiple nevi and seborrheic keratoses on her trunk, there is one that's dysplastic that she will need to see the dermatologist for on the right posterior shoulder appear  Psychiatric: She has a normal mood and affect. Her behavior is normal. Judgment and thought content normal.  Slightly anxious   BP 121/72  Pulse 108  Temp(Src) 97.3 F (36.3 C) (Oral)  Ht 4\' 11"  (1.499 m)  Wt 191 lb (86.637 kg)  BMI 38.56 kg/m2        Assessment & Plan:  1. Atrial fibrillation -Continue to follow that regularly and yearly with her cardiologist - POCT CBC; Future  2. COPD (chronic obstructive pulmonary disease) - POCT CBC; Future  3. HYPERTENSION, UNSPECIFIED - POCT CBC; Future - BMP8+EGFR; Future - Hepatic function panel; Future - NMR, lipoprofile; Future - POCT UA - Microalbumin  4. DM -Continue  to monitor blood sugars and feet as closely as Possible - POCT CBC; Future - POCT glycosylated hemoglobin (Hb A1C); Future - NMR, lipoprofile; Future - POCT UA - Microalbumin  5. Postmenopausal -Pelvic exam will be scheduled with one of the providers in our office - DG Bone Density; Future  6. Vitamin D deficiency -Continue current treatment - Vit D  25 hydroxy (rtn osteoporosis monitoring); Future  7. OVERWEIGHT/OBESITY -Continue watching diet closely and getting as much exercise as possible  8. CROHN'S DISEASE -Continue to follow up with the gastroenterologist  9. Anemia  10. Dysplastic nevus of trunk -Referral to the dermatologist for excision of this dysplastic nevus on her back  Meds ordered this encounter  Medications  . ALPRAZolam (XANAX) 0.5 MG tablet    Sig: Take 1/2 to 1 whole tabs at bedtime as needed for sleep and anxiety.    Dispense:  30 tablet    Refill:  5   Patient Instructions  Continue current medications. Continue good therapeutic lifestyle changes which include good diet and exercise. Fall precautions discussed with patient. Schedule your flu vaccine if you haven't had it yet If  you are over 71 years old - you may need Prevnar 13 or the adult Pneumonia vaccine. Schedule an appointment for you to see Dr. Nita Sells a dermatologist    Nyra Capes MD

## 2013-05-30 NOTE — Patient Instructions (Addendum)
Continue current medications. Continue good therapeutic lifestyle changes which include good diet and exercise. Fall precautions discussed with patient. Schedule your flu vaccine if you haven't had it yet If you are over 75 years old - you may need Prevnar 13 or the adult Pneumonia vaccine. Schedule an appointment for you to see Dr. Nita Sells a dermatologist

## 2013-05-30 NOTE — Telephone Encounter (Signed)
Per Dr.Rehman the patient will need to have labs drawn in April , noted for the 23 rd.

## 2013-06-05 ENCOUNTER — Other Ambulatory Visit: Payer: Self-pay | Admitting: Family Medicine

## 2013-06-06 ENCOUNTER — Other Ambulatory Visit (INDEPENDENT_AMBULATORY_CARE_PROVIDER_SITE_OTHER): Payer: Medicare Other

## 2013-06-06 DIAGNOSIS — J449 Chronic obstructive pulmonary disease, unspecified: Secondary | ICD-10-CM

## 2013-06-06 DIAGNOSIS — E559 Vitamin D deficiency, unspecified: Secondary | ICD-10-CM

## 2013-06-06 DIAGNOSIS — I10 Essential (primary) hypertension: Secondary | ICD-10-CM

## 2013-06-06 DIAGNOSIS — E119 Type 2 diabetes mellitus without complications: Secondary | ICD-10-CM

## 2013-06-06 DIAGNOSIS — I4891 Unspecified atrial fibrillation: Secondary | ICD-10-CM

## 2013-06-06 LAB — POCT CBC
Granulocyte percent: 70.3 %G (ref 37–80)
HCT, POC: 38 % (ref 37.7–47.9)
Hemoglobin: 11.6 g/dL — AB (ref 12.2–16.2)
Lymph, poc: 1.2 (ref 0.6–3.4)
MCH: 24.8 pg — AB (ref 27–31.2)
MCHC: 30.7 g/dL — AB (ref 31.8–35.4)
MCV: 81 fL (ref 80–97)
MPV: 8.2 fL (ref 0–99.8)
PLATELET COUNT, POC: 184 10*3/uL (ref 142–424)
POC Granulocyte: 3.9 (ref 2–6.9)
POC LYMPH %: 22.7 % (ref 10–50)
RBC: 4.7 M/uL (ref 4.04–5.48)
RDW, POC: 17.9 %
WBC: 5.5 10*3/uL (ref 4.6–10.2)

## 2013-06-06 LAB — POCT GLYCOSYLATED HEMOGLOBIN (HGB A1C): Hemoglobin A1C: 5.7

## 2013-06-06 NOTE — Progress Notes (Signed)
PATIENT CAME IN FOR LABS ONLY 

## 2013-06-07 ENCOUNTER — Other Ambulatory Visit: Payer: Self-pay | Admitting: Family Medicine

## 2013-06-07 LAB — BMP8+EGFR
BUN / CREAT RATIO: 10 — AB (ref 11–26)
BUN: 10 mg/dL (ref 8–27)
CHLORIDE: 97 mmol/L (ref 97–108)
CO2: 23 mmol/L (ref 18–29)
Calcium: 9.7 mg/dL (ref 8.6–10.2)
Creatinine, Ser: 1.05 mg/dL — ABNORMAL HIGH (ref 0.57–1.00)
GFR calc Af Amer: 60 mL/min/{1.73_m2} (ref >59)
GFR calc non Af Amer: 52 mL/min/{1.73_m2} — ABNORMAL LOW (ref >59)
Glucose: 78 mg/dL (ref 65–99)
Potassium: 5.9 mmol/L (ref 3.5–5.2)
SODIUM: 133 mmol/L — AB (ref 134–144)

## 2013-06-07 LAB — HEPATIC FUNCTION PANEL
ALK PHOS: 99 IU/L (ref 39–117)
ALT: 13 IU/L (ref 0–32)
AST: 28 IU/L (ref 0–40)
Albumin: 3.2 g/dL — ABNORMAL LOW (ref 3.5–4.8)
Bilirubin, Direct: 0.53 mg/dL — ABNORMAL HIGH (ref 0.00–0.40)
Total Bilirubin: 1.3 mg/dL — ABNORMAL HIGH (ref 0.0–1.2)
Total Protein: 6.7 g/dL (ref 6.0–8.5)

## 2013-06-07 LAB — NMR, LIPOPROFILE
Cholesterol: 98 mg/dL (ref <200)
HDL Cholesterol by NMR: 39 mg/dL — ABNORMAL LOW (ref >=40)
HDL Particle Number: 13.1 umol/L — ABNORMAL LOW (ref >=30.5)
LDL Particle Number: 825 nmol/L (ref <1000)
LDL Size: 21.2 nm (ref >20.5)
LDLC SERPL CALC-MCNC: 44 mg/dL (ref <100)
Triglycerides by NMR: 74 mg/dL (ref <150)

## 2013-06-07 LAB — VITAMIN D 25 HYDROXY (VIT D DEFICIENCY, FRACTURES): VIT D 25 HYDROXY: 20.9 ng/mL — AB (ref 30.0–100.0)

## 2013-06-13 ENCOUNTER — Other Ambulatory Visit (INDEPENDENT_AMBULATORY_CARE_PROVIDER_SITE_OTHER): Payer: Medicare Other

## 2013-06-13 DIAGNOSIS — R7989 Other specified abnormal findings of blood chemistry: Secondary | ICD-10-CM

## 2013-06-14 LAB — BMP8+EGFR
BUN / CREAT RATIO: 10 — AB (ref 11–26)
BUN: 10 mg/dL (ref 8–27)
CO2: 24 mmol/L (ref 18–29)
CREATININE: 1.04 mg/dL — AB (ref 0.57–1.00)
Calcium: 9.6 mg/dL (ref 8.6–10.2)
Chloride: 100 mmol/L (ref 97–108)
GFR calc Af Amer: 60 mL/min/{1.73_m2} (ref >59)
GFR calc non Af Amer: 52 mL/min/{1.73_m2} — ABNORMAL LOW (ref >59)
Glucose: 203 mg/dL — ABNORMAL HIGH (ref 65–99)
Potassium: 5.6 mmol/L — ABNORMAL HIGH (ref 3.5–5.2)
Sodium: 135 mmol/L (ref 134–144)

## 2013-06-19 ENCOUNTER — Telehealth (INDEPENDENT_AMBULATORY_CARE_PROVIDER_SITE_OTHER): Payer: Self-pay | Admitting: *Deleted

## 2013-06-19 NOTE — Telephone Encounter (Signed)
Would like for Dr. Laural Golden review the labs Dr. Laurance Flatten has done. They are in EPIC. Chaylee's return phone number is [phone].

## 2013-06-19 NOTE — Telephone Encounter (Signed)
Dr.Rehman will be made aware. 

## 2013-06-27 ENCOUNTER — Ambulatory Visit (INDEPENDENT_AMBULATORY_CARE_PROVIDER_SITE_OTHER): Payer: Medicare Other | Admitting: General Practice

## 2013-06-27 ENCOUNTER — Encounter: Payer: Self-pay | Admitting: General Practice

## 2013-06-27 VITALS — BP 124/62 | HR 82 | Temp 97.3°F | Ht 59.0 in | Wt 190.0 lb

## 2013-06-27 DIAGNOSIS — Z124 Encounter for screening for malignant neoplasm of cervix: Secondary | ICD-10-CM

## 2013-06-27 DIAGNOSIS — Z01419 Encounter for gynecological examination (general) (routine) without abnormal findings: Secondary | ICD-10-CM

## 2013-06-27 DIAGNOSIS — Z0000 Encounter for general adult medical examination without abnormal findings: Secondary | ICD-10-CM

## 2013-06-27 LAB — POCT URINALYSIS DIPSTICK
Bilirubin, UA: NEGATIVE
Blood, UA: NEGATIVE
GLUCOSE UA: NEGATIVE
Ketones, UA: NEGATIVE
Leukocytes, UA: NEGATIVE
Nitrite, UA: NEGATIVE
Protein, UA: NEGATIVE
Urobilinogen, UA: NEGATIVE
pH, UA: 5

## 2013-06-27 LAB — POCT UA - MICROSCOPIC ONLY
BACTERIA, U MICROSCOPIC: NEGATIVE
Casts, Ur, LPF, POC: NEGATIVE
Crystals, Ur, HPF, POC: NEGATIVE
Mucus, UA: NEGATIVE
RBC, urine, microscopic: NEGATIVE
WBC, Ur, HPF, POC: NEGATIVE
Yeast, UA: NEGATIVE

## 2013-06-27 NOTE — Progress Notes (Signed)
   Subjective:    Patient ID: [name], female    DOB: [birth year], 78 y.o.   MRN: [id number]  HPI Patient presents today for annual exam. She reports a mammogram is being scheduled. Denies any complaints at this time.     Review of Systems  Constitutional: Negative for fever and chills.  Respiratory: Negative for chest tightness and shortness of breath.   Cardiovascular: Negative for chest pain and palpitations.  Genitourinary: Negative for dysuria, frequency, hematuria and difficulty urinating.       Objective:   Physical Exam  Constitutional: She is oriented to person, place, and time. She appears well-developed and well-nourished.  Cardiovascular: Normal rate, regular rhythm and normal heart sounds.   Pulmonary/Chest: Effort normal and breath sounds normal. No respiratory distress. She exhibits no tenderness. Right breast exhibits no inverted nipple, no mass, no nipple discharge, no skin change and no tenderness. Left breast exhibits no inverted nipple, no mass, no nipple discharge, no skin change and no tenderness. Breasts are symmetrical.  Abdominal: Soft. Bowel sounds are normal. She exhibits no distension. There is no tenderness.  Genitourinary: Vagina normal. No breast swelling, tenderness, discharge or bleeding. No labial fusion. There is no rash, tenderness, lesion or injury on the right labia. There is no rash, tenderness, lesion or injury on the left labia. Right adnexum displays no mass, no tenderness and no fullness. Left adnexum displays no mass, no tenderness and no fullness. No erythema, tenderness or bleeding around the vagina. No foreign body around the vagina. No signs of injury around the vagina. No vaginal discharge found.  Neurological: She is alert and oriented to person, place, and time.  Skin: Skin is warm and dry.  Psychiatric: She has a normal mood and affect.          Assessment & Plan:  1. Annual physical exam  - POCT urinalysis dipstick -  POCT UA - Microscopic Only - Pap IG (Image Guided) -RTO prn Patient verbalized understanding Erby Pian, FNP-C

## 2013-06-27 NOTE — Patient Instructions (Signed)

## 2013-06-28 ENCOUNTER — Ambulatory Visit (INDEPENDENT_AMBULATORY_CARE_PROVIDER_SITE_OTHER): Payer: Medicare Other | Admitting: Pharmacist

## 2013-06-28 ENCOUNTER — Ambulatory Visit (INDEPENDENT_AMBULATORY_CARE_PROVIDER_SITE_OTHER): Payer: Medicare Other

## 2013-06-28 ENCOUNTER — Encounter: Payer: Self-pay | Admitting: Pharmacist

## 2013-06-28 VITALS — Ht 59.25 in | Wt 195.0 lb

## 2013-06-28 DIAGNOSIS — M949 Disorder of cartilage, unspecified: Secondary | ICD-10-CM

## 2013-06-28 DIAGNOSIS — Z720 Tobacco use: Secondary | ICD-10-CM

## 2013-06-28 DIAGNOSIS — M8580 Other specified disorders of bone density and structure, unspecified site: Secondary | ICD-10-CM

## 2013-06-28 DIAGNOSIS — M899 Disorder of bone, unspecified: Secondary | ICD-10-CM

## 2013-06-28 DIAGNOSIS — F17200 Nicotine dependence, unspecified, uncomplicated: Secondary | ICD-10-CM

## 2013-06-28 DIAGNOSIS — I4891 Unspecified atrial fibrillation: Secondary | ICD-10-CM

## 2013-06-28 DIAGNOSIS — Z780 Asymptomatic menopausal state: Secondary | ICD-10-CM

## 2013-06-28 LAB — PAP IG (IMAGE GUIDED): PAP SMEAR COMMENT: 0

## 2013-06-28 LAB — POCT INR: INR: 2.4

## 2013-06-28 NOTE — Patient Instructions (Signed)
Anticoagulation Dose Instructions as of 06/28/2013     [name] Tue Wed Thu Fri Sat   New Dose 0.5 mg 0.5 mg 1 mg 0.5 mg 1 mg 0.5 mg 0.5 mg    Description       Continue warfarin dose of 1/2 tablet daily except 1 tablet on tuesdays and thursdays.      INR was 2.4 today     Smoking Cessation Quitting smoking is important to your health and has many advantages. However, it is not always easy to quit since nicotine is a very addictive drug. Often times, people try 3 times or more before being able to quit. This document explains the best ways for you to prepare to quit smoking. Quitting takes hard work and a lot of effort, but you can do it. ADVANTAGES OF QUITTING SMOKING  You will live longer, feel better, and live better.  Your body will feel the impact of quitting smoking almost immediately.  Within 20 minutes, blood pressure decreases. Your pulse returns to its normal level.  After 8 hours, carbon monoxide levels in the blood return to normal. Your oxygen level increases.  After 24 hours, the chance of having a heart attack starts to decrease. Your breath, hair, and body stop smelling like smoke.  After 48 hours, damaged nerve endings begin to recover. Your sense of taste and smell improve.  After 72 hours, the body is virtually free of nicotine. Your bronchial tubes relax and breathing becomes easier.  After 2 to 12 weeks, lungs can hold more air. Exercise becomes easier and circulation improves.  The risk of having a heart attack, stroke, cancer, or lung disease is greatly reduced.  After 1 year, the risk of coronary heart disease is cut in half.  After 5 years, the risk of stroke falls to the same as a nonsmoker.  After 10 years, the risk of lung cancer is cut in half and the risk of other cancers decreases significantly.  After 15 years, the risk of coronary heart disease drops, usually to the level of a nonsmoker.  If you are pregnant, quitting smoking will improve  your chances of having a healthy baby.  The people you live with, especially any children, will be healthier.  You will have extra money to spend on things other than cigarettes. QUESTIONS TO THINK ABOUT BEFORE ATTEMPTING TO QUIT You may want to talk about your answers with your caregiver.  Why do you want to quit?  If you tried to quit in the past, what helped and what did not?  What will be the most difficult situations for you after you quit? How will you plan to handle them?  Who can help you through the tough times? Your family? Friends? A caregiver?  What pleasures do you get from smoking? What ways can you still get pleasure if you quit? Here are some questions to ask your caregiver:  How can you help me to be successful at quitting?  What medicine do you think would be best for me and how should I take it?  What should I do if I need more help?  What is smoking withdrawal like? How can I get information on withdrawal? GET READY  Set a quit date.  Change your environment by getting rid of all cigarettes, ashtrays, matches, and lighters in your home, car, or work. Do not let people smoke in your home.  Review your past attempts to quit. Think about what worked and what did  not. GET SUPPORT AND ENCOURAGEMENT You have a better chance of being successful if you have help. You can get support in many ways.  Tell your family, friends, and co-workers that you are going to quit and need their support. Ask them not to smoke around you.  Get individual, group, or telephone counseling and support. Programs are available at [employer] and health centers. Call your local health department for information about programs in your area.  Spiritual beliefs and practices may help some smokers quit.  Download a "quit meter" on your computer to keep track of quit statistics, such as how long you have gone without smoking, cigarettes not smoked, and money saved.  Get a self-help  book about quitting smoking and staying off of tobacco. [place] yourself from urges to smoke. Talk to someone, go for a walk, or occupy your time with a task.  Change your normal routine. Take a different route to work. Drink tea instead of coffee. Eat breakfast in a different place.  Reduce your stress. Take a hot bath, exercise, or read a book.  Plan something enjoyable to do every day. Reward yourself for not smoking.  Explore interactive web-based programs that specialize in helping you quit. GET MEDICINE AND USE IT CORRECTLY Medicines can help you stop smoking and decrease the urge to smoke. Combining medicine with the above behavioral methods and support can greatly increase your chances of successfully quitting smoking.  Nicotine replacement therapy helps deliver nicotine to your body without the negative effects and risks of smoking. Nicotine replacement therapy includes nicotine gum, lozenges, inhalers, nasal sprays, and skin patches. Some may be available over-the-counter and others require a prescription.  Antidepressant medicine helps people abstain from smoking, but how this works is unknown. This medicine is available by prescription.  Nicotinic receptor partial agonist medicine simulates the effect of nicotine in your brain. This medicine is available by prescription. Ask your caregiver for advice about which medicines to use and how to use them based on your health history. Your caregiver will tell you what side effects to look out for if you choose to be on a medicine or therapy. Carefully read the information on the package. Do not use any other product containing nicotine while using a nicotine replacement product.  RELAPSE OR DIFFICULT SITUATIONS Most relapses occur within the first 3 months after quitting. Do not be discouraged if you start smoking again. Remember, most people try several times before finally quitting. You may have symptoms  of withdrawal because your body is used to nicotine. You may crave cigarettes, be irritable, feel very hungry, cough often, get headaches, or have difficulty concentrating. The withdrawal symptoms are only temporary. They are strongest when you first quit, but they will go away within 10 14 days. To reduce the chances of relapse, try to:  Avoid drinking alcohol. Drinking lowers your chances of successfully quitting.  Reduce the amount of caffeine you consume. Once you quit smoking, the amount of caffeine in your body increases and can give you symptoms, such as a rapid heartbeat, sweating, and anxiety.  Avoid smokers because they can make you want to smoke.  Do not let weight gain distract you. Many smokers will gain weight when they quit, usually less than 10 pounds. Eat a healthy diet and stay active. You can always lose the weight gained after you quit.  Find ways to improve your mood other than smoking. FOR MORE INFORMATION  www.smokefree.gov  Document  Released: 05/12/2001 Document Revised: 11/17/2011 Document Reviewed: 08/27/2011 [facility] Patient Information 2014 [place].     Fall Prevention and Home Safety Falls cause injuries and can affect all age groups. It is possible to use preventive measures to significantly decrease the likelihood of falls. There are many simple measures which can make your home safer and prevent falls. OUTDOORS  Repair cracks and edges of walkways and driveways.  Remove high doorway thresholds.  Trim shrubbery on the main path into your home.  Have good outside lighting.  Clear walkways of tools, rocks, debris, and clutter.  Check that handrails are not broken and are securely fastened. Both sides of steps should have handrails.  Have leaves, snow, and ice cleared regularly.  Use sand or salt on walkways during winter months.  In the garage, clean up grease or oil spills. BATHROOM  Install night lights.  Install grab bars by the  toilet and in the tub and shower.  Use non-skid mats or decals in the tub or shower.  Place a plastic non-slip stool in the shower to sit on, if needed.  Keep floors dry and clean up all water on the floor immediately.  Remove soap buildup in the tub or shower on a regular basis.  Secure bath mats with non-slip, double-sided rug tape.  Remove throw rugs and tripping hazards from the floors. BEDROOMS  Install night lights.  Make sure a bedside light is easy to reach.  Do not use oversized bedding.  Keep a telephone by your bedside.  Have a firm chair with side arms to use for getting dressed.  Remove throw rugs and tripping hazards from the floor. KITCHEN  Keep handles on pots and pans turned toward the center of the stove. Use back burners when possible.  Clean up spills quickly and allow time for drying.  Avoid walking on wet floors.  Avoid hot utensils and knives.  Position shelves so they are not too high or low.  Place commonly used objects within easy reach.  If necessary, use a sturdy step stool with a grab bar when reaching.  Keep electrical cables out of the way.  Do not use floor polish or wax that makes floors slippery. If you must use wax, use non-skid floor wax.  Remove throw rugs and tripping hazards from the floor. STAIRWAYS  Never leave objects on stairs.  Place handrails on both sides of stairways and use them. Fix any loose handrails. Make sure handrails on both sides of the stairways are as long as the stairs.  Check carpeting to make sure it is firmly attached along stairs. Make repairs to worn or loose carpet promptly.  Avoid placing throw rugs at the top or bottom of stairways, or properly secure the rug with carpet tape to prevent slippage. Get rid of throw rugs, if possible.  Have an electrician put in a light switch at the top and bottom of the stairs. OTHER FALL PREVENTION TIPS  Wear low-heel or rubber-soled shoes that are  supportive and fit well. Wear closed toe shoes.  When using a stepladder, make sure it is fully opened and both spreaders are firmly locked. Do not climb a closed stepladder.  Add color or contrast paint or tape to grab bars and handrails in your home. Place contrasting color strips on first and last steps.  Learn and use mobility aids as needed. Install an electrical emergency response system.  Turn on lights to avoid dark areas. Replace light bulbs that burn out  immediately. Get light switches that glow.  Arrange furniture to create clear pathways. Keep furniture in the same place.  Firmly attach carpet with non-skid or double-sided tape.  Eliminate uneven floor surfaces.  Select a carpet pattern that does not visually hide the edge of steps.  Be aware of all pets. OTHER HOME SAFETY TIPS  Set the water temperature for 120 F (48.8 C).  Keep emergency numbers on or near the telephone.  Keep smoke detectors on every level of the home and near sleeping areas. Document Released: 05/08/2002 Document Revised: 11/17/2011 Document Reviewed: 08/07/2011 [facility] Patient Information 2014 [place].

## 2013-06-28 NOTE — Progress Notes (Signed)
Patient ID: [name], female   DOB: 07/29/1935, 78 y.o.   MRN: [id number]  [facility] Current Height: Height: 4' 11.25" (150.5 cm)      Max Lifetime Height:  5\' 0"  Current Weight: Weight: 195 lb (88.451 kg)       Ethnicity:Caucasian    [id number] with osteopenia here today for recheck DEXA - no current medication therapy. Continues to smoke about 10 cigarettes per day. Interested in quitting but doesn't want medication therapy because no covered by her insurance.  Patient is also due recheck INR - chronic anticoagulation secondary to a fib.  Back Pain?  No       Kyphosis?  No Prior fracture?  No Med(s) for Osteoporosis/Osteopenia:  none Med(s) previously tried for Osteoporosis/Osteopenia:  none                                                             PMH: Age at menopause:  Surgical at age 53 Hysterectomy?  Yes Oophorectomy?  Removed 1 ovary  HRT? Yes - Former.  Type/duration:   Steroid Use?  No Thyroid med?  No History of cancer?  Yes - skin CA History of digestive disorders (ie Crohn's)?  Yes - on PPI daily Current or previous eating disorders?  No Last Vitamin D Result: 20.9 (06/06/2013) Last GFR Result:  52 (06/06/2013)   FH/SH: Family history of osteoporosis?  No Parent with history of hip fracture?  No Family history of breast cancer?  No Exercise?  No - very little Smoking?  Yes - 10 cigs per day Alcohol?  No    Calcium Assessment Calcium Intake  # of servings/day  Calcium mg  Milk (8 oz) 1  x  300  = 300mg   Yogurt (4 oz) 0 x  200 = 0  Cheese (1 oz) 1 x  200 = 200mg   Other Calcium sources   250mg   Ca supplement none = 0   Estimated calcium intake per day 750mg     DEXA Results Date of Test T-Score for AP Spine L1-L4 T-Score for Total Left Hip T-Score for Total Right Hip T-Score for Neck of Left HIp T-Score for Neck of Right Hip  06/28/2013 0.5 -0.1 0.2 -1.4 -1.1  09/10/2010 0.3 0.3 0.7 -1.2 -0.8  05/09/2008 0.5 0.6 0.7 -0.3 -0.6           FRAX 10 year estimate: Total FX risk:  10%  (consider medication if >/= 20%) Hip FX risk:  3%  (consider medication if >/= 3%)  INR was 2.4 today Assessment: Osteopenia Therapeutic anticoagulation Tobacco abuse  Recommendations: 1.  Discussed DEXA results and her increased fracture risk and the fact that smoking is a modifiable risk factor for fractures / osteoporosis.   2.  Patient is motivated to discontinue smoking though she does not want to medication thearpy.  Discussed setting s quit date and getting support person.  Also discussed tips to assist in smoking cesssation 3.  recommend calcium 1200mg  daily through supplementation or diet.  4.  recommend weight bearing exercise - 30 minutes at least 4 days  per week.   5.  Counseled and educated about fall risk and prevention.  Recheck DEXA:  2 years  Time spent counseling patient:  30 minutes  Cherre Robins, PharmD, CPP

## 2013-07-05 ENCOUNTER — Other Ambulatory Visit: Payer: Self-pay | Admitting: Family Medicine

## 2013-08-08 ENCOUNTER — Emergency Department (HOSPITAL_COMMUNITY): Payer: Medicare Other

## 2013-08-08 ENCOUNTER — Encounter (HOSPITAL_COMMUNITY): Payer: Self-pay | Admitting: Emergency Medicine

## 2013-08-08 ENCOUNTER — Inpatient Hospital Stay (HOSPITAL_COMMUNITY)
Admission: EM | Admit: 2013-08-08 | Discharge: 2013-08-11 | DRG: 872 | Disposition: A | Discharge status: Home / Self Care | Payer: Medicare Other | Attending: Internal Medicine | Admitting: Internal Medicine

## 2013-08-08 DIAGNOSIS — K219 Gastro-esophageal reflux disease without esophagitis: Secondary | ICD-10-CM | POA: Diagnosis present

## 2013-08-08 DIAGNOSIS — F411 Generalized anxiety disorder: Secondary | ICD-10-CM | POA: Diagnosis present

## 2013-08-08 DIAGNOSIS — I4891 Unspecified atrial fibrillation: Secondary | ICD-10-CM

## 2013-08-08 DIAGNOSIS — A419 Sepsis, unspecified organism: Principal | ICD-10-CM | POA: Diagnosis present

## 2013-08-08 DIAGNOSIS — R109 Unspecified abdominal pain: Secondary | ICD-10-CM

## 2013-08-08 DIAGNOSIS — Z85828 Personal history of other malignant neoplasm of skin: Secondary | ICD-10-CM

## 2013-08-08 DIAGNOSIS — K5900 Constipation, unspecified: Secondary | ICD-10-CM | POA: Diagnosis present

## 2013-08-08 DIAGNOSIS — L0390 Cellulitis, unspecified: Secondary | ICD-10-CM

## 2013-08-08 DIAGNOSIS — E871 Hypo-osmolality and hyponatremia: Secondary | ICD-10-CM

## 2013-08-08 DIAGNOSIS — I10 Essential (primary) hypertension: Secondary | ICD-10-CM | POA: Diagnosis present

## 2013-08-08 DIAGNOSIS — L03119 Cellulitis of unspecified part of limb: Secondary | ICD-10-CM

## 2013-08-08 DIAGNOSIS — H5440 Blindness, one eye, unspecified eye: Secondary | ICD-10-CM | POA: Diagnosis present

## 2013-08-08 DIAGNOSIS — Z7901 Long term (current) use of anticoagulants: Secondary | ICD-10-CM

## 2013-08-08 DIAGNOSIS — R6510 Systemic inflammatory response syndrome (SIRS) of non-infectious origin without acute organ dysfunction: Secondary | ICD-10-CM

## 2013-08-08 DIAGNOSIS — I509 Heart failure, unspecified: Secondary | ICD-10-CM | POA: Diagnosis present

## 2013-08-08 DIAGNOSIS — J4489 Other specified chronic obstructive pulmonary disease: Secondary | ICD-10-CM | POA: Diagnosis present

## 2013-08-08 DIAGNOSIS — I878 Other specified disorders of veins: Secondary | ICD-10-CM

## 2013-08-08 DIAGNOSIS — Z8249 Family history of ischemic heart disease and other diseases of the circulatory system: Secondary | ICD-10-CM

## 2013-08-08 DIAGNOSIS — K529 Noninfective gastroenteritis and colitis, unspecified: Secondary | ICD-10-CM

## 2013-08-08 DIAGNOSIS — K7689 Other specified diseases of liver: Secondary | ICD-10-CM | POA: Diagnosis present

## 2013-08-08 DIAGNOSIS — J449 Chronic obstructive pulmonary disease, unspecified: Secondary | ICD-10-CM | POA: Diagnosis present

## 2013-08-08 DIAGNOSIS — E119 Type 2 diabetes mellitus without complications: Secondary | ICD-10-CM

## 2013-08-08 DIAGNOSIS — D649 Anemia, unspecified: Secondary | ICD-10-CM

## 2013-08-08 DIAGNOSIS — J9610 Chronic respiratory failure, unspecified whether with hypoxia or hypercapnia: Secondary | ICD-10-CM | POA: Diagnosis present

## 2013-08-08 DIAGNOSIS — Z806 Family history of leukemia: Secondary | ICD-10-CM

## 2013-08-08 DIAGNOSIS — K5289 Other specified noninfective gastroenteritis and colitis: Secondary | ICD-10-CM

## 2013-08-08 DIAGNOSIS — E872 Acidosis, unspecified: Secondary | ICD-10-CM

## 2013-08-08 DIAGNOSIS — K7460 Unspecified cirrhosis of liver: Secondary | ICD-10-CM

## 2013-08-08 DIAGNOSIS — Z800 Family history of malignant neoplasm of digestive organs: Secondary | ICD-10-CM

## 2013-08-08 DIAGNOSIS — L02419 Cutaneous abscess of limb, unspecified: Secondary | ICD-10-CM | POA: Diagnosis present

## 2013-08-08 DIAGNOSIS — J441 Chronic obstructive pulmonary disease with (acute) exacerbation: Secondary | ICD-10-CM | POA: Diagnosis present

## 2013-08-08 DIAGNOSIS — M129 Arthropathy, unspecified: Secondary | ICD-10-CM | POA: Diagnosis present

## 2013-08-08 DIAGNOSIS — L0291 Cutaneous abscess, unspecified: Secondary | ICD-10-CM

## 2013-08-08 DIAGNOSIS — F17200 Nicotine dependence, unspecified, uncomplicated: Secondary | ICD-10-CM | POA: Diagnosis present

## 2013-08-08 HISTORY — DX: Unspecified intestinal obstruction, unspecified as to partial versus complete obstruction: K56.609

## 2013-08-08 LAB — COMPREHENSIVE METABOLIC PANEL
ALT: 15 U/L (ref 0–35)
AST: 28 U/L (ref 0–37)
Albumin: 2.7 g/dL — ABNORMAL LOW (ref 3.5–5.2)
Alkaline Phosphatase: 99 U/L (ref 39–117)
BUN: 11 mg/dL (ref 6–23)
CO2: 22 meq/L (ref 19–32)
Calcium: 9.4 mg/dL (ref 8.4–10.5)
Chloride: 91 mEq/L — ABNORMAL LOW (ref 96–112)
Creatinine, Ser: 0.85 mg/dL (ref 0.50–1.10)
GFR calc non Af Amer: 65 mL/min — ABNORMAL LOW (ref >90)
GFR, EST AFRICAN AMERICAN: 75 mL/min — AB (ref >90)
GLUCOSE: 171 mg/dL — AB (ref 70–99)
Potassium: 4.9 mEq/L (ref 3.7–5.3)
Sodium: 126 mEq/L — ABNORMAL LOW (ref 137–147)
TOTAL PROTEIN: 7.3 g/dL (ref 6.0–8.3)
Total Bilirubin: 1.3 mg/dL — ABNORMAL HIGH (ref 0.3–1.2)

## 2013-08-08 LAB — URINALYSIS, ROUTINE W REFLEX MICROSCOPIC
Bilirubin Urine: NEGATIVE
Glucose, UA: NEGATIVE mg/dL
Hgb urine dipstick: NEGATIVE
Ketones, ur: NEGATIVE mg/dL
Leukocytes, UA: NEGATIVE
Nitrite: NEGATIVE
PROTEIN: NEGATIVE mg/dL
Specific Gravity, Urine: <1.005 — ABNORMAL LOW (ref 1.005–1.030)
UROBILINOGEN UA: 0.2 mg/dL (ref 0.0–1.0)
pH: 5.5 (ref 5.0–8.0)

## 2013-08-08 LAB — LIPASE, BLOOD: Lipase: 58 U/L (ref 11–59)

## 2013-08-08 LAB — CBC WITH DIFFERENTIAL/PLATELET
Basophils Absolute: 0 10*3/uL (ref 0.0–0.1)
Basophils Relative: 1 % (ref 0–1)
EOS ABS: 0 10*3/uL (ref 0.0–0.7)
EOS PCT: 0 % (ref 0–5)
HEMATOCRIT: 36.3 % (ref 36.0–46.0)
Hemoglobin: 11.7 g/dL — ABNORMAL LOW (ref 12.0–15.0)
LYMPHS ABS: 1 10*3/uL (ref 0.7–4.0)
LYMPHS PCT: 13 % (ref 12–46)
MCH: 25.6 pg — AB (ref 26.0–34.0)
MCHC: 32.2 g/dL (ref 30.0–36.0)
MCV: 79.4 fL (ref 78.0–100.0)
MONO ABS: 0.7 10*3/uL (ref 0.1–1.0)
MONOS PCT: 9 % (ref 3–12)
Neutro Abs: 6 10*3/uL (ref 1.7–7.7)
Neutrophils Relative %: 78 % — ABNORMAL HIGH (ref 43–77)
Platelets: 227 10*3/uL (ref 150–400)
RBC: 4.57 MIL/uL (ref 3.87–5.11)
RDW: 17.3 % — ABNORMAL HIGH (ref 11.5–15.5)
WBC: 7.6 10*3/uL (ref 4.0–10.5)

## 2013-08-08 LAB — GLUCOSE, CAPILLARY: Glucose-Capillary: 82 mg/dL (ref 70–99)

## 2013-08-08 LAB — LACTIC ACID, PLASMA: LACTIC ACID, VENOUS: 3.6 mmol/L — AB (ref 0.5–2.2)

## 2013-08-08 LAB — I-STAT CG4 LACTIC ACID, ED: Lactic Acid, Venous: 3.2 mmol/L — ABNORMAL HIGH (ref 0.5–2.2)

## 2013-08-08 MED ORDER — MORPHINE SULFATE 4 MG/ML IJ SOLN
4.0000 mg | Freq: Once | INTRAMUSCULAR | Status: AC
  Administered 2013-08-08: 4 mg via INTRAVENOUS
  Filled 2013-08-08: qty 1

## 2013-08-08 MED ORDER — IOHEXOL 300 MG/ML  SOLN
50.0000 mL | Freq: Once | INTRAMUSCULAR | Status: AC | PRN
  Administered 2013-08-08: 50 mL via ORAL

## 2013-08-08 MED ORDER — ONDANSETRON HCL 4 MG PO TABS: 4.0000 mg | ORAL_TABLET | Freq: Four times a day (QID) | ORAL | Status: DC

## 2013-08-08 MED ORDER — GABAPENTIN 100 MG PO CAPS
100.0000 mg | ORAL_CAPSULE | Freq: Every day | ORAL | Status: DC
  Administered 2013-08-09 – 2013-08-10 (×2): 100 mg via ORAL
  Filled 2013-08-08 (×2): qty 1

## 2013-08-08 MED ORDER — ALPRAZOLAM 0.25 MG PO TABS
0.2500 mg | ORAL_TABLET | Freq: Every day | ORAL | Status: DC
  Administered 2013-08-09: 0.5 mg via ORAL
  Administered 2013-08-10: 0.25 mg via ORAL
  Administered 2013-08-11: 0.5 mg via ORAL
  Filled 2013-08-08: qty 1
  Filled 2013-08-08 (×2): qty 2

## 2013-08-08 MED ORDER — MESALAMINE ER 250 MG PO CPCR
ORAL_CAPSULE | ORAL | Status: AC
  Filled 2013-08-08: qty 4

## 2013-08-08 MED ORDER — INSULIN ASPART 100 UNIT/ML ~~LOC~~ SOLN
0.0000 [IU] | Freq: Three times a day (TID) | SUBCUTANEOUS | Status: DC
  Administered 2013-08-10: 1 [IU] via SUBCUTANEOUS
  Administered 2013-08-10: 2 [IU] via SUBCUTANEOUS
  Administered 2013-08-11: 1 [IU] via SUBCUTANEOUS

## 2013-08-08 MED ORDER — IOHEXOL 300 MG/ML  SOLN
100.0000 mL | Freq: Once | INTRAMUSCULAR | Status: AC | PRN
  Administered 2013-08-08: 100 mL via INTRAVENOUS

## 2013-08-08 MED ORDER — SODIUM CHLORIDE 0.9 % IV SOLN
INTRAVENOUS | Status: DC
  Administered 2013-08-08 – 2013-08-09 (×4): via INTRAVENOUS

## 2013-08-08 MED ORDER — ONDANSETRON HCL 4 MG/2ML IJ SOLN
4.0000 mg | Freq: Once | INTRAMUSCULAR | Status: AC
  Administered 2013-08-08: 4 mg via INTRAVENOUS
  Filled 2013-08-08: qty 2

## 2013-08-08 MED ORDER — MORPHINE SULFATE 2 MG/ML IJ SOLN: 2.0000 mg | INTRAMUSCULAR | Status: DC | PRN

## 2013-08-08 MED ORDER — VANCOMYCIN HCL IN DEXTROSE 1-5 GM/200ML-% IV SOLN
1000.0000 mg | Freq: Two times a day (BID) | INTRAVENOUS | Status: DC
  Administered 2013-08-09 – 2013-08-11 (×5): 1000 mg via INTRAVENOUS
  Filled 2013-08-08 (×12): qty 200

## 2013-08-08 MED ORDER — ONDANSETRON HCL 4 MG/2ML IJ SOLN
4.0000 mg | Freq: Four times a day (QID) | INTRAMUSCULAR | Status: DC | PRN
  Administered 2013-08-10: 4 mg via INTRAVENOUS
  Filled 2013-08-08: qty 2

## 2013-08-08 MED ORDER — VANCOMYCIN HCL IN DEXTROSE 1-5 GM/200ML-% IV SOLN
INTRAVENOUS | Status: AC
  Filled 2013-08-08: qty 200

## 2013-08-08 MED ORDER — ONDANSETRON HCL 4 MG PO TABS: 4.0000 mg | ORAL_TABLET | Freq: Four times a day (QID) | ORAL | Status: DC | PRN

## 2013-08-08 MED ORDER — DILTIAZEM HCL ER COATED BEADS 180 MG PO CP24
180.0000 mg | ORAL_CAPSULE | Freq: Every day | ORAL | Status: DC
  Administered 2013-08-09 – 2013-08-11 (×3): 180 mg via ORAL
  Filled 2013-08-08 (×4): qty 1

## 2013-08-08 MED ORDER — MESALAMINE ER 250 MG PO CPCR
1000.0000 mg | ORAL_CAPSULE | Freq: Three times a day (TID) | ORAL | Status: DC
  Administered 2013-08-09 – 2013-08-11 (×7): 1000 mg via ORAL
  Filled 2013-08-08 (×17): qty 4

## 2013-08-08 MED ORDER — PROMETHAZINE HCL 25 MG RE SUPP: 25.0000 mg | Freq: Four times a day (QID) | RECTAL | Status: DC | PRN

## 2013-08-08 MED ORDER — ONDANSETRON HCL 4 MG/2ML IJ SOLN: 4.0000 mg | Freq: Three times a day (TID) | INTRAMUSCULAR | Status: AC | PRN

## 2013-08-08 MED ORDER — SODIUM CHLORIDE 0.9 % IV SOLN: INTRAVENOUS | Status: DC

## 2013-08-08 NOTE — ED Notes (Signed)
Attempted to inject lactulose via rectal tube, unknown amt of lactulose returned.  Pt unable to hold meds in.

## 2013-08-08 NOTE — ED Provider Notes (Addendum)
CSN: [id number]     Arrival date & time 08/08/13  1201 History  This chart was scribed for Orpah Greek, MD by Roxan Diesel, ED scribe.  This patient was seen in room APA04/[facility]04 and the patient's care was started at 2:42 PM.   Chief Complaint  Patient presents with  . Abdominal Pain    The history is provided by the patient. No language interpreter was used.    HPI Comments: [name] is a 78 y.o. female with h/o SBO, Crohn's disease, CHF, COPD, A-fib, DM, and HTN who presents to the Emergency Department complaining of constant generalized abdominal pain that began yesterday morning.  Pt states her pain feels similar to prior SBO.  She reports that last week she was having some diarrhea and since then she has become constipated.  She is unsure of her last BM.  She also notes some associated abdominal distension and vomiting.  She admits to h/o hernia but denies increased pain to that area.   Past Medical History  Diagnosis Date  . Hypertension     x 5 years  . Paroxysmal atrial fibrillation   . Anxiety   . Crohn's disease   . 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 No clear documentation of this  . Diabetes mellitus   . Neuromuscular disorder     periph neuropathy  . Shortness of breath   . Arthritis   . GERD (gastroesophageal reflux disease)   . Blood transfusion   . COPD (chronic obstructive pulmonary disease)   . Cirrhosis     non aloholic   . Pleural effusion 02/13/2012  . RVH (right ventricular hypertrophy) 02/13/2012  . Anemia 02/13/2012  . Chronic respiratory failure 02/15/2012    With hypoxia and hypercapnia.  . Anasarca 01/2012    Ejection fraction 60-65%  . CHF (congestive heart failure)   . Small bowel obstruction     Past Surgical History  Procedure Laterality Date  . Basal cell carcinoma excision    . Abdominal surgery      multiple  . Vesicovaginal fistula closure w/ tah    . Cancers resected    . Appendectomy     . Abdominal hysterectomy    . Tubal ligation    . Cholecystectomy    . Colon surgery      hx bleeding ulcer  . Mass excision  06/23/2011    Procedure: EXCISION MASS;  Surgeon: Tennis Must, MD;  Location: [place];  Service: Orthopedics;  Laterality: Left;  left hand excision mass with acell placement  . Left hand  06/23/11    Removed Squamous cell , and also did graft  . Mass excision  11/02/2011    Procedure: EXCISION MASS;  Surgeon: Tennis Must, MD;  Location: [place];  Service: Orthopedics;  Laterality: Left;  EXCISION MASS LEFT HAND with full thickness skin graft from left upper arm  . Colonoscopy with esophagogastroduodenoscopy (egd) N/A 08/01/2012    Procedure: COLONOSCOPY WITH ESOPHAGOGASTRODUODENOSCOPY (EGD);  Surgeon: Rogene Houston, MD;  [facility];  Service: Endoscopy;  Laterality: N/A;  730  . Balloon dilation N/A 08/01/2012    Procedure: BALLOON DILATION;  Surgeon: Rogene Houston, MD;  [facility];  Service: Endoscopy;  Laterality: N/A;  Venia Minks dilation N/A 08/01/2012    Procedure: Venia Minks DILATION;  Surgeon: Rogene Houston, MD;  [facility];  Service: Endoscopy;  Laterality: N/A;  .  Savory dilation N/A 08/01/2012    Procedure: SAVORY DILATION;  Surgeon: Rogene Houston, MD;  [facility];  Service: Endoscopy;  Laterality: N/A;    Family History  Problem Relation Age of Onset  . Coronary artery disease Father   . Heart attack Father   . Coronary artery disease Mother   . Coronary artery disease Brother   . Heart attack Brother   . Pancreatic cancer Brother   . Heart attack Brother   . Leukemia Daughter   . Healthy Daughter   . Colon cancer Neg Hx     History  Substance Use Topics  . Smoking status: Current Every Day Smoker -- 0.50 packs/day for 40 years    Types: Cigarettes    Start date: 06/02/1963  . Smokeless tobacco: Never Used  . Alcohol Use: No    OB History   Grav Para  Term Preterm Abortions TAB SAB Ect Mult Living                   Review of Systems  Gastrointestinal: Positive for vomiting, abdominal pain and constipation.  All other systems reviewed and are negative.      Allergies  Sulfonamide derivatives  Home Medications   Current Outpatient Rx  Name  Route  Sig  Dispense  Refill  . acetaminophen (TYLENOL) 500 MG tablet   Oral   Take 500 mg by mouth every 8 (eight) hours as needed. Pain         . ALPRAZolam (XANAX) 0.5 MG tablet   Oral   Take 0.25-0.5 mg by mouth daily.         Marland Kitchen diltiazem (CARDIZEM CD) 180 MG 24 hr capsule   Oral   Take 180 mg by mouth daily.         . furosemide (LASIX) 20 MG tablet   Oral   Take 20 mg by mouth daily.         Marland Kitchen gabapentin (NEURONTIN) 100 MG capsule   Oral   Take 100 mg by mouth at bedtime.         . lansoprazole (PREVACID) 30 MG capsule   Oral   Take 30 mg by mouth daily as needed (for acid reflux).          . mesalamine (PENTASA) 500 MG CR capsule   Oral   Take 2 capsules (1,000 mg total) by mouth 3 (three) times daily.   180 capsule   2   . metFORMIN (GLUCOPHAGE) 1000 MG tablet   Oral   Take 1,000 mg by mouth 2 (two) times daily with a meal.         . Multiple Vitamin (MULTIVITAMIN) tablet   Oral   Take 1 tablet by mouth daily.           . potassium chloride (K-DUR) 10 MEQ tablet   Oral   Take 10 mEq by mouth every morning.         Marland Kitchen spironolactone (ALDACTONE) 50 MG tablet   Oral   Take 50 mg by mouth 2 (two) times daily.         . vitamin C (ASCORBIC ACID) 500 MG tablet   Oral   Take 500 mg by mouth daily.         Marland Kitchen warfarin (COUMADIN) 1 MG tablet   Oral   Take 0.5-1 mg by mouth daily. Patient takes 1mg  on Tues.and Thurs. And 0.5mg  all other days         .  ferrous sulfate 325 (65 FE) MG tablet   Oral   Take 325 mg by mouth. Patient takes this medication 3 times per week          BP 129/52  Pulse 72  Temp(Src) 98.4 F (36.9 C)  (Oral)  Resp 18  Ht 5' (1.524 m)  Wt 184 lb (83.462 kg)  BMI 35.94 kg/m2  SpO2 99%  Physical Exam  Nursing note and vitals reviewed. Constitutional: She is oriented to person, place, and time. She appears well-developed and well-nourished. No distress.  HENT:  Head: Normocephalic and atraumatic.  Right Ear: Hearing normal.  Left Ear: Hearing normal.  Nose: Nose normal.  Mouth/Throat: Oropharynx is clear and moist and mucous membranes are normal.  Eyes: Conjunctivae and EOM are normal. Pupils are equal, round, and reactive to light.  Neck: Normal range of motion. Neck supple.  Cardiovascular: Regular rhythm, S1 normal and S2 normal.  Exam reveals no gallop and no friction rub.   No murmur heard. Pulmonary/Chest: Effort normal and breath sounds normal. No respiratory distress. She exhibits no tenderness.  Abdominal: Soft. Normal appearance. She exhibits distension. She exhibits no mass. There is no hepatosplenomegaly. There is tenderness (diffusely). There is no rebound, no guarding, no tenderness at McBurney's point and negative Murphy's sign. No hernia.  Bowel sounds slightly high-pitched but present  Musculoskeletal: Normal range of motion.  Neurological: She is alert and oriented to person, place, and time. She has normal strength. No cranial nerve deficit or sensory deficit. Coordination normal. GCS eye subscore is 4. GCS verbal subscore is 5. GCS motor subscore is 6.  Skin: Skin is warm, dry and intact. No rash noted. No cyanosis.  Psychiatric: She has a normal mood and affect. Her speech is normal and behavior is normal. Thought content normal.    ED Course  Procedures (including critical care time)  DIAGNOSTIC STUDIES: Oxygen Saturation is 99% on room air, normal by my interpretation.    COORDINATION OF CARE: 2:46 PM-Discussed treatment plan which includes abdomen x-ray with pt at bedside and pt agreed to plan.     Labs Review Labs Reviewed  CBC WITH DIFFERENTIAL -  Abnormal; Notable for the following:    Hemoglobin 11.7 (*)    MCH 25.6 (*)    RDW 17.3 (*)    Neutrophils Relative % 78 (*)    All other components within normal limits  COMPREHENSIVE METABOLIC PANEL - Abnormal; Notable for the following:    Sodium 126 (*)    Chloride 91 (*)    Glucose, Bld 171 (*)    Albumin 2.7 (*)    Total Bilirubin 1.3 (*)    GFR calc non Af Amer 65 (*)    GFR calc Af Amer 75 (*)    All other components within normal limits  LACTIC ACID, PLASMA - Abnormal; Notable for the following:    Lactic Acid, Venous 3.6 (*)    All other components within normal limits  URINALYSIS, ROUTINE W REFLEX MICROSCOPIC - Abnormal; Notable for the following:    Specific Gravity, Urine <1.005 (*)    All other components within normal limits  LIPASE, BLOOD    Imaging Review Ct Abdomen Pelvis W Contrast  08/08/2013   CLINICAL DATA Diffuse abdominal pain  EXAM CT ABDOMEN AND PELVIS WITH CONTRAST  TECHNIQUE Multidetector CT imaging of the abdomen and pelvis was performed using the standard protocol following bolus administration of intravenous contrast.  CONTRAST 4mL OMNIPAQUE IOHEXOL 300 MG/ML SOLN, 190mL OMNIPAQUE IOHEXOL  300 MG/ML SOLN  COMPARISON 03/04/2013  FINDINGS BODY WALL: There is a circumscribed dermal lesion in the medial lower left chest wall, most likely a dermal inclusion cyst. Skin thickening and subcutaneous reticulation in the lower abdominal wall is stable from prior.  LOWER CHEST: Small bilateral pleural effusions.  ABDOMEN/PELVIS:  Liver: Lobulated liver contour consistent with cirrhosis. No evidence of mass. Small volume ascites stable from prior. Portal venous hypertension with large splenorenal shunt.  Biliary: Cholecystectomy.  Stable mild common bile duct enlargement.  Pancreas: Unchanged coarse calcification in the pancreatic uncinate.  Spleen: Unchanged high-density or hypervascularity and anterior spleen, 1 cm in diameter.  Adrenals: Unremarkable.  Kidneys and  ureters: No hydronephrosis or stone.  Bladder: Unremarkable.  Reproductive: Hysterectomy. 4 mm cyst in the left ovary is not significantly changed over 13 months.  Bowel: Suspected extended right hemicolectomy. At the ileocolic anastomosis in the central to left abdomen, there is circumferential bowel wall thickening. This was the site of previous bowel obstruction.  Retroperitoneum: Diffuse mesenteric and small bowel adenopathy edema which is likely reactive.  Peritoneum: Small volume ascites stable from prior.  Vascular: Portal hypertension, as above.  OSSEOUS: No acute abnormalities.  IMPRESSION 1. Suspect low grade enteritis at the entero colonic anastomosis in this patient with history of Crohn's disease. 2. Cirrhosis with portal hypertension. 3. Small bilateral pleural effusions.  SIGNATURE  Electronically Signed   By: Jorje Guild M.D.   On: 08/08/2013 16:51   Dg Abd Acute W/chest  08/08/2013   CLINICAL DATA Abdomen pain, nausea, vomiting  EXAM ACUTE ABDOMEN SERIES (ABDOMEN 2 VIEW & CHEST 1 VIEW)  COMPARISON March 05, 2013, February 09, 2012  FINDINGS There is no evidence of dilated bowel loops or free intraperitoneal air. No radiopaque calculi or other significant radiographic abnormality is seen. Surgical clips are identified in the abdomen. There is prominence of right infrahilar region unchanged compared to prior chest x-ray of September 2013. There is a small left pleural effusion chronic unchanged compared to prior exam 2013. There is no focal pneumonia.  IMPRESSION No bowel obstruction or free air.  No acute cardiopulmonary disease.  SIGNATURE  Electronically Signed   By: Abelardo Diesel M.D.   On: 08/08/2013 15:48     EKG Interpretation None      MDM   Final diagnoses:  None    Patient presents to the ER for evaluation of nausea, vomiting and abdominal pain. Patient reports a previous history of small bowel obstruction and feels like this might be similar. She does report that she  had diarrhea last week and then did not have any problems afterwards. She thought she may be constipated, so she used an enema. I suspect that she was not constipated, but simply had no stool because of the diarrhea. She is experiencing cramping pain intermittently here in the ER with nausea, but this has been controlled with fluids and Zofran. She is improved. Blood work is unremarkable, other than chronic elevation of bilirubin, mildly elevated glucose, hyponatremia. Hyponatremia is slightly worse than has been previously, but she does have a history of chronically low sodium. A CAT scan was performed and does not show any evidence of obstruction. There is "low grade enteritis at the enterocolonic anastomosis". Significance of this is unclear, there is no sign of obstruction.  Patient's initial lactic acid was elevated at 3.6. After management here in the ER, repeat was slightly lower, and 3.2, but did not clear. She also has the moderate hyponatremia and therefore  I discussed the patient with the hospitalist, Doctor [place]. He did agree that the patient was reasonable for observation in the hospital for continued hydration. At this point I do not feel that the patient has any acute surgical process in her abdomen which will require surgical evaluation or intervention.   I personally performed the services described in this documentation, which was scribed in my presence. The recorded information has been reviewed and is accurate.    Orpah Greek, MD 08/08/13 [id number]  Orpah Greek, MD 08/09/13 [phone]

## 2013-08-08 NOTE — ED Notes (Signed)
Hospitalist at bedside 

## 2013-08-08 NOTE — ED Notes (Signed)
Pt ambulated in hall to bathroom with minimal assistance.   Reports mild nausea with ambulation.

## 2013-08-08 NOTE — ED Notes (Signed)
Pt c/o abd pain with n/v, constipation that started yesterday, has hx of SBO and states that the pain is the same. Unsure of last bowel movement, was having problems with diarrhea last week then became constipated, used enema last night with very little results.

## 2013-08-08 NOTE — ED Notes (Signed)
Assisted to restroom with standby . Also given warm blanket.

## 2013-08-08 NOTE — ED Notes (Signed)
Assisted pt. To BR w/ stand-by assist.

## 2013-08-08 NOTE — H&P (Signed)
PCP:   Redge Gainer, MD   Chief Complaint:  Abdominal pain  HPI: 78 year old female who   has a past medical history of Hypertension; Paroxysmal atrial fibrillation; Anxiety; Crohn's disease; Blindness of left eye; Diabetes mellitus; Neuromuscular disorder; Shortness of breath; Arthritis; GERD (gastroesophageal reflux disease); Blood transfusion; COPD (chronic obstructive pulmonary disease); Cirrhosis; Pleural effusion (02/13/2012); RVH (right ventricular hypertrophy) (02/13/2012); Anemia (02/13/2012); Chronic respiratory failure (02/15/2012); Anasarca (01/2012); CHF (congestive heart failure); and Small bowel obstruction. Today presented to the ED with chief complaint of abdominal pain, nausea and vomiting which started 2 days ago. Patient has a history of Crohn's disease with ileocolonic and anorectal strictures, status post surgery with ileocolonic anastomosis, recent small bowel obstruction at the level of ileocolonic  Anastomosis. Patient is followed by Dr. Laural Golden , GI as outpatient. Patient usually has diarrhea but did not have BM over the past 2 days, also she has not been able to pass gas per rectum. She denies fever, no chest pain or shortness of breath. She denies dysuria but has been feeling cold and shivering since she came to the hospital.  In the ED CT scan abdomen showed suspect low grade enteritis at the entero colonic anastomosis, Cirrhosis with postural hypertension. She has lactic acidosis with lactate of 3.6 which improved to 3.20, sodium of 126.  Allergies:   Allergies  Allergen Reactions  . Sulfonamide Derivatives Other (See Comments)    Felt funny      Past Medical History  Diagnosis Date  . Hypertension     x 5 years  . Paroxysmal atrial fibrillation   . Anxiety   . Crohn's disease   . 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 No clear documentation of this  . Diabetes mellitus   . Neuromuscular disorder     periph neuropathy  . Shortness of  breath   . Arthritis   . GERD (gastroesophageal reflux disease)   . Blood transfusion   . COPD (chronic obstructive pulmonary disease)   . Cirrhosis     non aloholic   . Pleural effusion 02/13/2012  . RVH (right ventricular hypertrophy) 02/13/2012  . Anemia 02/13/2012  . Chronic respiratory failure 02/15/2012    With hypoxia and hypercapnia.  . Anasarca 01/2012    Ejection fraction 60-65%  . CHF (congestive heart failure)   . Small bowel obstruction     Past Surgical History  Procedure Laterality Date  . Basal cell carcinoma excision    . Abdominal surgery      multiple  . Vesicovaginal fistula closure w/ tah    . Cancers resected    . Appendectomy    . Abdominal hysterectomy    . Tubal ligation    . Cholecystectomy    . Colon surgery      hx bleeding ulcer  . Mass excision  06/23/2011    Procedure: EXCISION MASS;  Surgeon: Tennis Must, MD;  Location: [place];  Service: Orthopedics;  Laterality: Left;  left hand excision mass with acell placement  . Left hand  06/23/11    Removed Squamous cell , and also did graft  . Mass excision  11/02/2011    Procedure: EXCISION MASS;  Surgeon: Tennis Must, MD;  Location: [place];  Service: Orthopedics;  Laterality: Left;  EXCISION MASS LEFT HAND with full thickness skin graft from left upper arm  . Colonoscopy with esophagogastroduodenoscopy (egd) N/A 08/01/2012    Procedure: COLONOSCOPY WITH ESOPHAGOGASTRODUODENOSCOPY (EGD);  Surgeon: Rogene Houston, MD;  [facility];  Service: Endoscopy;  Laterality: N/A;  730  . Balloon dilation N/A 08/01/2012    Procedure: BALLOON DILATION;  Surgeon: Rogene Houston, MD;  [facility];  Service: Endoscopy;  Laterality: N/A;  Venia Minks dilation N/A 08/01/2012    Procedure: Venia Minks DILATION;  Surgeon: Rogene Houston, MD;  [facility];  Service: Endoscopy;  Laterality: N/A;  . Savory dilation N/A 08/01/2012    Procedure: SAVORY DILATION;   Surgeon: Rogene Houston, MD;  [facility];  Service: Endoscopy;  Laterality: N/A;    Prior to Admission medications   Medication Sig Start Date End Date Taking? Authorizing Provider  acetaminophen (TYLENOL) 500 MG tablet Take 500 mg by mouth every 8 (eight) hours as needed. Pain   Yes Historical Provider, MD  ALPRAZolam (XANAX) 0.5 MG tablet Take 0.25-0.5 mg by mouth daily.   Yes Historical Provider, MD  diltiazem (CARDIZEM CD) 180 MG 24 hr capsule Take 180 mg by mouth daily.   Yes Historical Provider, MD  furosemide (LASIX) 20 MG tablet Take 20 mg by mouth daily.   Yes Historical Provider, MD  gabapentin (NEURONTIN) 100 MG capsule Take 100 mg by mouth at bedtime.   Yes Historical Provider, MD  lansoprazole (PREVACID) 30 MG capsule Take 30 mg by mouth daily as needed (for acid reflux).    Yes Historical Provider, MD  mesalamine (PENTASA) 500 MG CR capsule Take 2 capsules (1,000 mg total) by mouth 3 (three) times daily. 03/28/13  Yes Rogene Houston, MD  metFORMIN (GLUCOPHAGE) 1000 MG tablet Take 1,000 mg by mouth 2 (two) times daily with a meal.   Yes Historical Provider, MD  Multiple Vitamin (MULTIVITAMIN) tablet Take 1 tablet by mouth daily.     Yes Historical Provider, MD  potassium chloride (K-DUR) 10 MEQ tablet Take 10 mEq by mouth every morning.   Yes Historical Provider, MD  spironolactone (ALDACTONE) 50 MG tablet Take 50 mg by mouth 2 (two) times daily.   Yes Historical Provider, MD  vitamin C (ASCORBIC ACID) 500 MG tablet Take 500 mg by mouth daily.   Yes Historical Provider, MD  warfarin (COUMADIN) 1 MG tablet Take 0.5-1 mg by mouth daily. Patient takes 51m on Tues.and Thurs. And 0.586mall other days   Yes Historical Provider, MD  ferrous sulfate 325 (65 FE) MG tablet Take 325 mg by mouth. Patient takes this medication 3 times per week    Historical Provider, MD    Social History:  reports that she has been smoking Cigarettes.  She started smoking about 50 years ago. She  has a 20 pack-year smoking history. She has never used smokeless tobacco. She reports that she does not drink alcohol or use illicit drugs.  Family History  Problem Relation Age of Onset  . Coronary artery disease Father   . Heart attack Father   . Coronary artery disease Mother   . Coronary artery disease Brother   . Heart attack Brother   . Pancreatic cancer Brother   . Heart attack Brother   . Leukemia Daughter   . Healthy Daughter   . Colon cancer Neg Hx      All the positives are listed in BOLD  Review of Systems:  HEENT: Headache, blurred vision, runny nose, sore throat Neck: Hypothyroidism, hyperthyroidism,,lymphadenopathy Chest : Shortness of breath, history of COPD, Asthma Heart : Chest pain, history of coronary arterey disease, atrial fibrillation GI:  Nausea, vomiting, diarrhea, constipation, GERD GU: Dysuria, urgency, frequency of urination, hematuria Neuro: Stroke, seizures, syncope Psych: Depression, anxiety, hallucinations   Physical Exam: Blood pressure 136/51, pulse 80, temperature 98.4 F (36.9 C), temperature source Oral, resp. rate 20, height 5' (1.524 m), weight 83.462 kg (184 lb), SpO2 97.00%. Constitutional:   Patient is a well-developed and well-nourished female* in no acute distress and cooperative with exam. Head: Normocephalic and atraumatic Mouth: Mucus membranes moist Eyes: PERRL, EOMI, conjunctivae normal Neck: Supple, No Thyromegaly Cardiovascular: RRR, S1 normal, S2 normal Pulmonary/Chest: CTAB, no wheezes, rales, or rhonchi Abdominal: Soft. Non-tender, non-distended, bowel sounds are normal, no masses, organomegaly, or guarding present.  Neurological: A&O x3, Strenght is normal and symmetric bilaterally, cranial nerve II-XII are grossly intact, no focal motor deficit, sensory intact to light touch bilaterally.  Extremities : Mild Erythema noted in both the lower extremities, left lower extremities warm to touch as compared to right lower  extremity   Labs on Admission:  Results for orders placed during the hospital encounter of 08/08/13 (from the past 48 hour(s))  CBC WITH DIFFERENTIAL     Status: Abnormal   Collection Time    08/08/13  3:10 PM      Result Value Ref Range   WBC 7.6  4.0 - 10.5 K/uL   RBC 4.57  3.87 - 5.11 MIL/uL   Hemoglobin 11.7 (*) 12.0 - 15.0 g/dL   HCT 36.3  36.0 - 46.0 %   MCV 79.4  78.0 - 100.0 fL   MCH 25.6 (*) 26.0 - 34.0 pg   MCHC 32.2  30.0 - 36.0 g/dL   RDW 17.3 (*) 11.5 - 15.5 %   Platelets 227  150 - 400 K/uL   Neutrophils Relative % 78 (*) 43 - 77 %   Neutro Abs 6.0  1.7 - 7.7 K/uL   Lymphocytes Relative 13  12 - 46 %   Lymphs Abs 1.0  0.7 - 4.0 K/uL   Monocytes Relative 9  3 - 12 %   Monocytes Absolute 0.7  0.1 - 1.0 K/uL   Eosinophils Relative 0  0 - 5 %   Eosinophils Absolute 0.0  0.0 - 0.7 K/uL   Basophils Relative 1  0 - 1 %   Basophils Absolute 0.0  0.0 - 0.1 K/uL  COMPREHENSIVE METABOLIC PANEL     Status: Abnormal   Collection Time    08/08/13  3:10 PM      Result Value Ref Range   Sodium 126 (*) 137 - 147 mEq/L   Potassium 4.9  3.7 - 5.3 mEq/L   Chloride 91 (*) 96 - 112 mEq/L   CO2 22  19 - 32 mEq/L   Glucose, Bld 171 (*) 70 - 99 mg/dL   BUN 11  6 - 23 mg/dL   Creatinine, Ser 0.85  0.50 - 1.10 mg/dL   Calcium 9.4  8.4 - 10.5 mg/dL   Total Protein 7.3  6.0 - 8.3 g/dL   Albumin 2.7 (*) 3.5 - 5.2 g/dL   AST 28  0 - 37 U/L   ALT 15  0 - 35 U/L   Alkaline Phosphatase 99  39 - 117 U/L   Total Bilirubin 1.3 (*) 0.3 - 1.2 mg/dL   GFR calc non Af Amer 65 (*) >90 mL/min   GFR calc Af Amer 75 (*) >90 mL/min   Comment: (NOTE)     The eGFR has been calculated using the CKD EPI equation.  This calculation has not been validated in all clinical situations.     eGFR's persistently <90 mL/min signify possible Chronic Kidney     Disease.  LACTIC ACID, PLASMA     Status: Abnormal   Collection Time    08/08/13  3:10 PM      Result Value Ref Range   Lactic Acid, Venous 3.6  (*) 0.5 - 2.2 mmol/L  LIPASE, BLOOD     Status: None   Collection Time    08/08/13  3:10 PM      Result Value Ref Range   Lipase 58  11 - 59 U/L  URINALYSIS, ROUTINE W REFLEX MICROSCOPIC     Status: Abnormal   Collection Time    08/08/13  5:08 PM      Result Value Ref Range   Color, Urine YELLOW  YELLOW   APPearance CLEAR  CLEAR   Specific Gravity, Urine <1.005 (*) 1.005 - 1.030   pH 5.5  5.0 - 8.0   Glucose, UA NEGATIVE  NEGATIVE mg/dL   Hgb urine dipstick NEGATIVE  NEGATIVE   Bilirubin Urine NEGATIVE  NEGATIVE   Ketones, ur NEGATIVE  NEGATIVE mg/dL   Protein, ur NEGATIVE  NEGATIVE mg/dL   Urobilinogen, UA 0.2  0.0 - 1.0 mg/dL   Nitrite NEGATIVE  NEGATIVE   Leukocytes, UA NEGATIVE  NEGATIVE   Comment: MICROSCOPIC NOT DONE ON URINES WITH NEGATIVE PROTEIN, BLOOD, LEUKOCYTES, NITRITE, OR GLUCOSE <1000 mg/dL.  I-STAT CG4 LACTIC ACID, ED     Status: Abnormal   Collection Time    08/08/13  7:55 PM      Result Value Ref Range   Lactic Acid, Venous 3.20 (*) 0.5 - 2.2 mmol/L    Radiological Exams on Admission: Ct Abdomen Pelvis W Contrast  08/08/2013   CLINICAL DATA Diffuse abdominal pain  EXAM CT ABDOMEN AND PELVIS WITH CONTRAST  TECHNIQUE Multidetector CT imaging of the abdomen and pelvis was performed using the standard protocol following bolus administration of intravenous contrast.  CONTRAST 86m OMNIPAQUE IOHEXOL 300 MG/ML SOLN, 1040mOMNIPAQUE IOHEXOL 300 MG/ML SOLN  COMPARISON 03/04/2013  FINDINGS BODY WALL: There is a circumscribed dermal lesion in the medial lower left chest wall, most likely a dermal inclusion cyst. Skin thickening and subcutaneous reticulation in the lower abdominal wall is stable from prior.  LOWER CHEST: Small bilateral pleural effusions.  ABDOMEN/PELVIS:  Liver: Lobulated liver contour consistent with cirrhosis. No evidence of mass. Small volume ascites stable from prior. Portal venous hypertension with large splenorenal shunt.  Biliary: Cholecystectomy.   Stable mild common bile duct enlargement.  Pancreas: Unchanged coarse calcification in the pancreatic uncinate.  Spleen: Unchanged high-density or hypervascularity and anterior spleen, 1 cm in diameter.  Adrenals: Unremarkable.  Kidneys and ureters: No hydronephrosis or stone.  Bladder: Unremarkable.  Reproductive: Hysterectomy. 4 mm cyst in the left ovary is not significantly changed over 13 months.  Bowel: Suspected extended right hemicolectomy. At the ileocolic anastomosis in the central to left abdomen, there is circumferential bowel wall thickening. This was the site of previous bowel obstruction.  Retroperitoneum: Diffuse mesenteric and small bowel adenopathy edema which is likely reactive.  Peritoneum: Small volume ascites stable from prior.  Vascular: Portal hypertension, as above.  OSSEOUS: No acute abnormalities.  IMPRESSION 1. Suspect low grade enteritis at the entero colonic anastomosis in this patient with history of Crohn's disease. 2. Cirrhosis with portal hypertension. 3. Small bilateral pleural effusions.  SIGNATURE  Electronically Signed   By: JoGilford Silvius.  On: 08/08/2013 16:51   Dg Abd Acute W/chest  08/08/2013   CLINICAL DATA Abdomen pain, nausea, vomiting  EXAM ACUTE ABDOMEN SERIES (ABDOMEN 2 VIEW & CHEST 1 VIEW)  COMPARISON March 05, 2013, February 09, 2012  FINDINGS There is no evidence of dilated bowel loops or free intraperitoneal air. No radiopaque calculi or other significant radiographic abnormality is seen. Surgical clips are identified in the abdomen. There is prominence of right infrahilar region unchanged compared to prior chest x-ray of September 2013. There is a small left pleural effusion chronic unchanged compared to prior exam 2013. There is no focal pneumonia.  IMPRESSION No bowel obstruction or free air.  No acute cardiopulmonary disease.  SIGNATURE  Electronically Signed   By: Abelardo Diesel M.D.   On: 08/08/2013 15:48    Assessment/Plan Active Problems:    Abdominal pain   Cellulitis   ? Cellulitis Patient has? cellulitis of left lower extremity, will start the patient on vancomycin per pharmacy protocol. We'll also obtain ultrasound of the lower extremities to rule out DVT. The patient has been taking Coumadin anticoagulation for atrial fibrillation.  Lactic acidosis Patient has mild lactic acidosis, CT abdomen does not show any perforation. Likely due to combination of dehydration and infection. We'll continue the IV fluids and antibiotics and repeat lactic acid in the a.m. Will also hold metformin  Abdominal pain Patient has a history of Crohn's disease, CT scan showed entritis at the level of ileocolic junction, will get GI consult in the morning. No sign symptoms of perforation.  Nausea vomiting Patient has no SBO per CT scan, likely nausea vomiting is due to infectious process. We'll keep her n.p.o. Zofran when necessary for nausea and vomiting.  Diabetes mellitus Will hold metformin, start sliding scale insulin  Atrial fibrillation Heart rate is controlled, we'll get pharmacy to dose Coumadin. Continue diltiazem  Hyponatremia Likely combination of nausea vomiting, along with Lasix Will hold Lasix at this time, also obtain urine and serum osmolality Will check serum sodium in the morning  NAFLD Patient has history cirrhosis due to nonalcoholic fatty liver disease. Follow liver enzymes  Code status: patient is partial code, CPR only no intubation and mechanical ventilation.  Family discussion: No family at bedside, discussed with patient in detail   Time Spent on Admission: 75 minutes  [facility] Pager: [phone] 08/08/2013, 8:36 PM  If 7PM-7AM, please contact night-coverage  [email or web address]  Password [email or web address]

## 2013-08-09 ENCOUNTER — Observation Stay (HOSPITAL_COMMUNITY): Payer: Medicare Other

## 2013-08-09 DIAGNOSIS — E872 Acidosis, unspecified: Secondary | ICD-10-CM

## 2013-08-09 DIAGNOSIS — R6510 Systemic inflammatory response syndrome (SIRS) of non-infectious origin without acute organ dysfunction: Secondary | ICD-10-CM | POA: Diagnosis present

## 2013-08-09 LAB — COMPREHENSIVE METABOLIC PANEL
ALT: 18 U/L (ref 0–35)
AST: 31 U/L (ref 0–37)
Albumin: 2.3 g/dL — ABNORMAL LOW (ref 3.5–5.2)
Alkaline Phosphatase: 80 U/L (ref 39–117)
BILIRUBIN TOTAL: 1.7 mg/dL — AB (ref 0.3–1.2)
BUN: 10 mg/dL (ref 6–23)
CO2: 25 meq/L (ref 19–32)
CREATININE: 0.96 mg/dL (ref 0.50–1.10)
Calcium: 9 mg/dL (ref 8.4–10.5)
Chloride: 96 mEq/L (ref 96–112)
GFR calc Af Amer: 65 mL/min — ABNORMAL LOW (ref >90)
GFR calc non Af Amer: 56 mL/min — ABNORMAL LOW (ref >90)
GLUCOSE: 80 mg/dL (ref 70–99)
Potassium: 5.1 mEq/L (ref 3.7–5.3)
Sodium: 128 mEq/L — ABNORMAL LOW (ref 137–147)
Total Protein: 6.2 g/dL (ref 6.0–8.3)

## 2013-08-09 LAB — CBC
HEMATOCRIT: 28.5 % — AB (ref 36.0–46.0)
Hemoglobin: 9.4 g/dL — ABNORMAL LOW (ref 12.0–15.0)
MCH: 26 pg (ref 26.0–34.0)
MCHC: 33 g/dL (ref 30.0–36.0)
MCV: 78.9 fL (ref 78.0–100.0)
Platelets: 155 10*3/uL (ref 150–400)
RBC: 3.61 MIL/uL — ABNORMAL LOW (ref 3.87–5.11)
RDW: 17.1 % — ABNORMAL HIGH (ref 11.5–15.5)
WBC: 19.3 10*3/uL — ABNORMAL HIGH (ref 4.0–10.5)

## 2013-08-09 LAB — GLUCOSE, CAPILLARY
GLUCOSE-CAPILLARY: 80 mg/dL (ref 70–99)
GLUCOSE-CAPILLARY: 92 mg/dL (ref 70–99)
Glucose-Capillary: 80 mg/dL (ref 70–99)
Glucose-Capillary: 94 mg/dL (ref 70–99)
Glucose-Capillary: 109 mg/dL — ABNORMAL HIGH (ref 70–99)

## 2013-08-09 LAB — PROTIME-INR
INR: 2.4 — AB (ref 0.00–1.49)
Prothrombin Time: 25.4 seconds — ABNORMAL HIGH (ref 11.6–15.2)

## 2013-08-09 LAB — OSMOLALITY: Osmolality: 270 mOsm/kg — ABNORMAL LOW (ref 275–300)

## 2013-08-09 LAB — LACTIC ACID, PLASMA: LACTIC ACID, VENOUS: 2.7 mmol/L — AB (ref 0.5–2.2)

## 2013-08-09 MED ORDER — WARFARIN - PHARMACIST DOSING INPATIENT
Status: DC
  Administered 2013-08-09 – 2013-08-10 (×2)

## 2013-08-09 MED ORDER — WARFARIN SODIUM 1 MG PO TABS
1.0000 mg | ORAL_TABLET | Freq: Once | ORAL | Status: AC
  Administered 2013-08-09: 1 mg via ORAL
  Filled 2013-08-09: qty 1

## 2013-08-09 NOTE — Progress Notes (Signed)
ANTIBIOTIC CONSULT NOTE - INITIAL  Pharmacy Consult for Vancomycin Indication: cellulitis  Allergies  Allergen Reactions  . Sulfonamide Derivatives Other (See Comments)    Felt funny   Patient Measurements: Height: 5' (152.4 cm) Weight: 191 lb 8 oz (86.864 kg) IBW/kg (Calculated) : 45.5  Vital Signs: BP: 97/46 mmHg (03/11 1006) Pulse Rate: 136 (03/11 1006) Intake/Output from previous day:   Intake/Output from this shift:    Labs:  Recent Labs  08/08/13 1510 08/09/13 0625  WBC 7.6 19.3*  HGB 11.7* 9.4*  PLT 227 155  CREATININE 0.85 0.96   Estimated Creatinine Clearance: 48.9 ml/min (by C-G formula based on Cr of 0.96). No results found for this basename: VANCOTROUGH, VANCOPEAK, VANCORANDOM, GENTTROUGH, GENTPEAK, GENTRANDOM, TOBRATROUGH, TOBRAPEAK, TOBRARND, AMIKACINPEAK, AMIKACINTROU, AMIKACIN,  in the last 72 hours   Microbiology: No results found for this or any previous visit (from the past 720 hour(s)).  Medical History: Past Medical History  Diagnosis Date  . Hypertension     x 5 years  . Paroxysmal atrial fibrillation   . Anxiety   . Crohn's disease   . 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 No clear documentation of this  . Diabetes mellitus   . Neuromuscular disorder     periph neuropathy  . Shortness of breath   . Arthritis   . GERD (gastroesophageal reflux disease)   . Blood transfusion   . COPD (chronic obstructive pulmonary disease)   . Cirrhosis     non aloholic   . Pleural effusion 02/13/2012  . RVH (right ventricular hypertrophy) 02/13/2012  . Anemia 02/13/2012  . Chronic respiratory failure 02/15/2012    With hypoxia and hypercapnia.  . Anasarca 01/2012    Ejection fraction 60-65%  . CHF (congestive heart failure)   . Small bowel obstruction    Assessment: 78yo female admitted with cellulitis of LLE.  Renal fxn is at baseline with Estimated Creatinine Clearance: 48.9 ml/min (by C-G formula based on Cr of  0.96). Normalized clcr estimated at ~ 55-22ml/min. Vancomycin started last night on admission with 1gm dose. Pt is currently afebrile with elevated WBC.  Vancomycin 3/10 >>  Goal of Therapy:  Vancomycin trough level 10-15 mcg/ml  Plan:   Vancomycin 1gm IV q12hrs  Check trough at steady state  Monitor labs, renal fxn, and cultures  Hart Robinsons A 08/09/2013,11:03 AM

## 2013-08-09 NOTE — Progress Notes (Signed)
ANTICOAGULATION CONSULT NOTE - Initial Consult  Pharmacy Consult for Coumadin (chronic Rx PTA) Indication: atrial fibrillation  Allergies  Allergen Reactions  . Sulfonamide Derivatives Other (See Comments)    Felt funny   Patient Measurements: Height: 5' (152.4 cm) Weight: 191 lb 8 oz (86.864 kg) IBW/kg (Calculated) : 45.5  Vital Signs: BP: 97/46 mmHg (03/11 1006) Pulse Rate: 136 (03/11 1006)  Labs:  Recent Labs  08/08/13 1510 08/09/13 0625  HGB 11.7* 9.4*  HCT 36.3 28.5*  PLT 227 155  LABPROT  --  25.4*  INR  --  2.40*  CREATININE 0.85 0.96   Estimated Creatinine Clearance: 48.9 ml/min (by C-G formula based on Cr of 0.96).  Medical History: Past Medical History  Diagnosis Date  . Hypertension     x 5 years  . Paroxysmal atrial fibrillation   . Anxiety   . Crohn's disease   . 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 No clear documentation of this  . Diabetes mellitus   . Neuromuscular disorder     periph neuropathy  . Shortness of breath   . Arthritis   . GERD (gastroesophageal reflux disease)   . Blood transfusion   . COPD (chronic obstructive pulmonary disease)   . Cirrhosis     non aloholic   . Pleural effusion 02/13/2012  . RVH (right ventricular hypertrophy) 02/13/2012  . Anemia 02/13/2012  . Chronic respiratory failure 02/15/2012    With hypoxia and hypercapnia.  . Anasarca 01/2012    Ejection fraction 60-65%  . CHF (congestive heart failure)   . Small bowel obstruction    Medications:  Prescriptions prior to admission  Medication Sig Dispense Refill  . acetaminophen (TYLENOL) 500 MG tablet Take 500 mg by mouth every 8 (eight) hours as needed. Pain      . ALPRAZolam (XANAX) 0.5 MG tablet Take 0.25-0.5 mg by mouth daily.      Marland Kitchen diltiazem (CARDIZEM CD) 180 MG 24 hr capsule Take 180 mg by mouth daily.      . furosemide (LASIX) 20 MG tablet Take 20 mg by mouth daily.      Marland Kitchen gabapentin (NEURONTIN) 100 MG capsule Take 100 mg by mouth  at bedtime.      . lansoprazole (PREVACID) 30 MG capsule Take 30 mg by mouth daily as needed (for acid reflux).       . mesalamine (PENTASA) 500 MG CR capsule Take 2 capsules (1,000 mg total) by mouth 3 (three) times daily.  180 capsule  2  . metFORMIN (GLUCOPHAGE) 1000 MG tablet Take 1,000 mg by mouth 2 (two) times daily with a meal.      . Multiple Vitamin (MULTIVITAMIN) tablet Take 1 tablet by mouth daily.        . potassium chloride (K-DUR) 10 MEQ tablet Take 10 mEq by mouth every morning.      Marland Kitchen spironolactone (ALDACTONE) 50 MG tablet Take 50 mg by mouth 2 (two) times daily.      . vitamin C (ASCORBIC ACID) 500 MG tablet Take 500 mg by mouth daily.      Marland Kitchen warfarin (COUMADIN) 1 MG tablet Take 0.5-1 mg by mouth daily. Patient takes 1mg  on Tues.and Thurs. And 0.5mg  all other days      . ferrous sulfate 325 (65 FE) MG tablet Take 325 mg by mouth. Patient takes this medication 3 times per week       Assessment: 78yo female on chronic Coumadin PTA for afib.  Home dose listed above.  INR is therapeutic on admission.    Goal of Therapy:  INR 2-3 Monitor platelets by anticoagulation protocol: Yes   Plan:   Coumadin 1mg  po today x 1 (home dose)  INR daily.  [place] Crane, Shantae Vantol A 08/09/2013,11:00 AM

## 2013-08-09 NOTE — Care Management Note (Signed)
    Page 1 of 1   08/09/2013     4:11:06 PM   CARE MANAGEMENT NOTE 08/09/2013  Patient:  [name], [name]   Account Number:  [id number]  Date Initiated:  08/09/2013  Documentation initiated by:  Claretha Cooper  Subjective/Objective Assessment:   Pt lives at home alone. Daughter lives nearby. States [employer] is trying to arrange someone to come during home.     Action/Plan:   Pt will try to find the CM name at [facility].   Anticipated DC Date:     Anticipated DC Plan:        DC Planning Services  CM consult      Choice offered to / List presented to:             Status of service:  In process, will continue to follow Medicare Important Message given?   (If response is "NO", the following Medicare IM given date fields will be blank) Date Medicare IM given:   Date Additional Medicare IM given:    Discharge Disposition:    Per UR Regulation:    If discussed at Long Length of Stay Meetings, dates discussed:    Comments:  08/09/13 Claretha Cooper RN BSN CM

## 2013-08-09 NOTE — Progress Notes (Signed)
UR completed. Patient changed to inpatient- requiring IVF and IV antibiotics.  

## 2013-08-09 NOTE — Progress Notes (Addendum)
[facility] PROGRESS NOTE  [name] [id number] DOB: 1936/04/23 DOA: 08/08/2013 PCP: Redge Gainer, MD  Assessment/Plan: Sepsis due  to Bilateral LE cellulitis ( left >right) Continue empiric vancomycin. Monitor WBC (elevated this morning), and temperature curve Doppler lower extremity negative for DVT  Lactic acidosis Secondary to sepsis. Improving with hydration. Hold metformin   Abdominal pain CT scan showed enteritis at the level of ileocolic junction. No further abdominal pain after she had bowel movement today. Lactic acidosis improving. She had associated nausea and vomiting on presentation which has now resolved. Tolerating advanced diet  Diabetes mellitus For the metformin continue sliding scale insulin  Hyponatremia Possibly perianal and being on Lasix. Improving this a.m.  Paroxysmal A. fib HR elevated with minimal exertion. Now improved. continue cardizem. on chronic Coumadin with therapeutic INR  NAFLD Monitor liver enzymes  Code Status: partial Family Communication: none at bedside Disposition Plan: currently inapatient   Consultants:  NONE  Procedures:  none  Antibiotics:  IV vancomycin  HPI/Subjective: Patient seen and examined this am. Had BM this am and felt better. No further abdominal pain. Admission H&P reviewed  Objective: Filed Vitals:   08/09/13 1100  BP: 111/43  Pulse: 94  Temp: 98.9 F (37.2 C)  Resp: 20    Intake/Output Summary (Last 24 hours) at 08/09/13 1901 Last data filed at 08/09/13 1820  Gross per 24 hour  Intake    320 ml  Output      0 ml  Net    320 ml   Filed Weights   08/08/13 1233 08/08/13 2142 08/09/13 [id number]  Weight: 83.462 kg (184 lb) 85.821 kg (189 lb 3.2 oz) 86.864 kg (191 lb 8 oz)    Exam:   General:  Elderly female in no acute distress  HEENT: No pallor, moist oral mucosa  Chest: Clear to auscultation bilaterally, no added sounds  CVS: Normal S1-S2, no murmurs or  gallop  Abdomen: Soft, nontender, nondistended, bowel sounds present  Extremities: Bilateral leg edema with increased warmth (left greater than right)  CNS: AAO x3      Data Reviewed: Basic Metabolic Panel:  Recent Labs Lab 08/08/13 1510 08/09/13 0625  NA 126* 128*  K 4.9 5.1  CL 91* 96  CO2 22 25  GLUCOSE 171* 80  BUN 11 10  CREATININE 0.85 0.96  CALCIUM 9.4 9.0   Liver Function Tests:  Recent Labs Lab 08/08/13 1510 08/09/13 0625  AST 28 31  ALT 15 18  ALKPHOS 99 80  BILITOT 1.3* 1.7*  PROT 7.3 6.2  ALBUMIN 2.7* 2.3*    Recent Labs Lab 08/08/13 1510  LIPASE 58   No results found for this basename: AMMONIA,  in the last 168 hours CBC:  Recent Labs Lab 08/08/13 1510 08/09/13 0625  WBC 7.6 19.3*  NEUTROABS 6.0  --   HGB 11.7* 9.4*  HCT 36.3 28.5*  MCV 79.4 78.9  PLT 227 155   Cardiac Enzymes: No results found for this basename: CKTOTAL, CKMB, CKMBINDEX, TROPONINI,  in the last 168 hours BNP (last 3 results) No results found for this basename: PROBNP,  in the last 8760 hours CBG:  Recent Labs Lab 08/08/13 2350 08/09/13 0456 08/09/13 0747 08/09/13 1146 08/09/13 1635  GLUCAP 82 80 80 94 92    No results found for this or any previous visit (from the past 240 hour(s)).   Studies: Ct Abdomen Pelvis W Contrast  08/08/2013   CLINICAL DATA Diffuse abdominal pain  EXAM CT ABDOMEN AND  PELVIS WITH CONTRAST  TECHNIQUE Multidetector CT imaging of the abdomen and pelvis was performed using the standard protocol following bolus administration of intravenous contrast.  CONTRAST 8mL OMNIPAQUE IOHEXOL 300 MG/ML SOLN, 136mL OMNIPAQUE IOHEXOL 300 MG/ML SOLN  COMPARISON 03/04/2013  FINDINGS BODY WALL: There is a circumscribed dermal lesion in the medial lower left chest wall, most likely a dermal inclusion cyst. Skin thickening and subcutaneous reticulation in the lower abdominal wall is stable from prior.  LOWER CHEST: Small bilateral pleural effusions.   ABDOMEN/PELVIS:  Liver: Lobulated liver contour consistent with cirrhosis. No evidence of mass. Small volume ascites stable from prior. Portal venous hypertension with large splenorenal shunt.  Biliary: Cholecystectomy.  Stable mild common bile duct enlargement.  Pancreas: Unchanged coarse calcification in the pancreatic uncinate.  Spleen: Unchanged high-density or hypervascularity and anterior spleen, 1 cm in diameter.  Adrenals: Unremarkable.  Kidneys and ureters: No hydronephrosis or stone.  Bladder: Unremarkable.  Reproductive: Hysterectomy. 4 mm cyst in the left ovary is not significantly changed over 13 months.  Bowel: Suspected extended right hemicolectomy. At the ileocolic anastomosis in the central to left abdomen, there is circumferential bowel wall thickening. This was the site of previous bowel obstruction.  Retroperitoneum: Diffuse mesenteric and small bowel adenopathy edema which is likely reactive.  Peritoneum: Small volume ascites stable from prior.  Vascular: Portal hypertension, as above.  OSSEOUS: No acute abnormalities.  IMPRESSION 1. Suspect low grade enteritis at the entero colonic anastomosis in this patient with history of Crohn's disease. 2. Cirrhosis with portal hypertension. 3. Small bilateral pleural effusions.  SIGNATURE  Electronically Signed   By: Jorje Guild M.D.   On: 08/08/2013 16:51   US Venous Img Lower Bilateral  08/09/2013   CLINICAL DATA Swelling.  EXAM BILATERAL LOWER EXTREMITY VENOUS DOPPLER ULTRASOUND  TECHNIQUE Gray-scale sonography with graded compression, as well as color Doppler and duplex ultrasound, were performed to evaluate the deep venous system from the level of the common femoral vein through the popliteal and proximal calf veins. Spectral Doppler was utilized to evaluate flow at rest and with distal augmentation maneuvers.  COMPARISON None.  FINDINGS Thrombus within deep veins:  None visualized.  Compressibility of deep veins:  Normal.  Duplex waveform  respiratory phasicity:  Normal.  Duplex waveform response to augmentation:  Normal.  Venous reflux:  None visualized.  Other findings:  None visualized.  IMPRESSION Unremarkable lower extremity venous ultrasound.  No evidence of DVT.  SIGNATURE  Electronically Signed   By: Rolm Baptise M.D.   On: 08/09/2013 13:54   Dg Abd Acute W/chest  08/08/2013   CLINICAL DATA Abdomen pain, nausea, vomiting  EXAM ACUTE ABDOMEN SERIES (ABDOMEN 2 VIEW & CHEST 1 VIEW)  COMPARISON March 05, 2013, February 09, 2012  FINDINGS There is no evidence of dilated bowel loops or free intraperitoneal air. No radiopaque calculi or other significant radiographic abnormality is seen. Surgical clips are identified in the abdomen. There is prominence of right infrahilar region unchanged compared to prior chest x-ray of September 2013. There is a small left pleural effusion chronic unchanged compared to prior exam 2013. There is no focal pneumonia.  IMPRESSION No bowel obstruction or free air.  No acute cardiopulmonary disease.  SIGNATURE  Electronically Signed   By: Abelardo Diesel M.D.   On: 08/08/2013 15:48    Scheduled Meds: . ALPRAZolam  0.25-0.5 mg Oral Daily  . diltiazem  180 mg Oral Daily  . gabapentin  100 mg Oral QHS  . insulin aspart  0-9 Units Subcutaneous TID WC  . mesalamine  1,000 mg Oral TID  . vancomycin  1,000 mg Intravenous Q12H  . Warfarin - Pharmacist Dosing Inpatient   Does not apply Q24H   Continuous Infusions: . sodium chloride 75 mL/hr at 08/09/13 1817      Time spent: 25 minutes    Nolia Tschantz  [facility] Pager [phone]. If 7PM-7AM, please contact night-coverage at [email or web address], password [facility] 08/09/2013, 7:01 PM  LOS: 1 day

## 2013-08-10 ENCOUNTER — Other Ambulatory Visit: Payer: Self-pay | Admitting: *Deleted

## 2013-08-10 DIAGNOSIS — D649 Anemia, unspecified: Secondary | ICD-10-CM

## 2013-08-10 DIAGNOSIS — E119 Type 2 diabetes mellitus without complications: Secondary | ICD-10-CM

## 2013-08-10 DIAGNOSIS — I872 Venous insufficiency (chronic) (peripheral): Secondary | ICD-10-CM

## 2013-08-10 DIAGNOSIS — E871 Hypo-osmolality and hyponatremia: Secondary | ICD-10-CM

## 2013-08-10 LAB — BASIC METABOLIC PANEL
BUN: 10 mg/dL (ref 6–23)
CALCIUM: 9 mg/dL (ref 8.4–10.5)
CO2: 26 mEq/L (ref 19–32)
Chloride: 99 mEq/L (ref 96–112)
Creatinine, Ser: 0.84 mg/dL (ref 0.50–1.10)
GFR calc Af Amer: 76 mL/min — ABNORMAL LOW (ref >90)
GFR, EST NON AFRICAN AMERICAN: 66 mL/min — AB (ref >90)
GLUCOSE: 92 mg/dL (ref 70–99)
POTASSIUM: 4.8 meq/L (ref 3.7–5.3)
Sodium: 131 mEq/L — ABNORMAL LOW (ref 137–147)

## 2013-08-10 LAB — GLUCOSE, CAPILLARY
GLUCOSE-CAPILLARY: 142 mg/dL — AB (ref 70–99)
GLUCOSE-CAPILLARY: 150 mg/dL — AB (ref 70–99)
Glucose-Capillary: 88 mg/dL (ref 70–99)
Glucose-Capillary: 110 mg/dL — ABNORMAL HIGH (ref 70–99)
Glucose-Capillary: 114 mg/dL — ABNORMAL HIGH (ref 70–99)

## 2013-08-10 LAB — CBC
HEMATOCRIT: 29.2 % — AB (ref 36.0–46.0)
Hemoglobin: 9.5 g/dL — ABNORMAL LOW (ref 12.0–15.0)
MCH: 26.2 pg (ref 26.0–34.0)
MCHC: 32.5 g/dL (ref 30.0–36.0)
MCV: 80.4 fL (ref 78.0–100.0)
Platelets: 154 10*3/uL (ref 150–400)
RBC: 3.63 MIL/uL — ABNORMAL LOW (ref 3.87–5.11)
RDW: 17.5 % — AB (ref 11.5–15.5)
WBC: 8.5 10*3/uL (ref 4.0–10.5)

## 2013-08-10 LAB — LACTIC ACID, PLASMA: Lactic Acid, Venous: 1.4 mmol/L (ref 0.5–2.2)

## 2013-08-10 LAB — PROTIME-INR
INR: 2.57 — ABNORMAL HIGH (ref 0.00–1.49)
Prothrombin Time: 26.7 seconds — ABNORMAL HIGH (ref 11.6–15.2)

## 2013-08-10 LAB — OSMOLALITY, URINE: OSMOLALITY UR: 303 mosm/kg — AB (ref 390–1090)

## 2013-08-10 MED ORDER — WARFARIN SODIUM 1 MG PO TABS
1.0000 mg | ORAL_TABLET | Freq: Once | ORAL | Status: AC
Start: 2013-08-10 — End: 2013-08-10
  Administered 2013-08-10: 1 mg via ORAL
  Filled 2013-08-10: qty 1

## 2013-08-10 NOTE — Progress Notes (Signed)
[facility] PROGRESS NOTE  [name] YIR:[id number] DOB: 06/14/1935 DOA: 08/08/2013 PCP: Redge Gainer, MD  Assessment/Plan: Sepsis due  to Bilateral LE cellulitis ( left >right) Continue empiric vancomycin. Clinically improving. Leukocytosis resolved.   Lactic acidosis Secondary to sepsis. Resolved with hydration. Hold metformin   Abdominal pain CT scan showed enteritis at the level of ileocolic junction. No further abdominal pain after she had bowel movement today. Lactic acidosis improving. She had associated nausea and vomiting on presentation which has now resolved. Tolerating a liquid diet.  advanced to regular.  Diabetes mellitus hold metformin.  continue sliding scale insulin  Hyponatremia Possibly prerenal and being on Lasix. Improving in a.m. labs  Paroxysmal A. fib HR elevated with minimal exertion. Now improved. continue cardizem. on chronic Coumadin with therapeutic INR  NAFLD Monitor liver enzymes  Code Status: partial Family Communication: none at bedside Disposition Plan: Will obtain PT eval . Pt lives by herself. May benefit from short term skilled nursing facility   Consultants:  NONE  Procedures:  none  Antibiotics:  IV vancomycin  HPI/Subjective: Patient seen and examined this am. Feels overall better.  Objective: Filed Vitals:   08/10/13 0540  BP: 117/61  Pulse: 109  Temp: 98.4 F (36.9 C)  Resp: 11    Intake/Output Summary (Last 24 hours) at 08/10/13 1306 Last data filed at 08/10/13 1159  Gross per 24 hour  Intake 1603.75 ml  Output    300 ml  Net 1303.75 ml   Filed Weights   08/08/13 1233 08/08/13 2142 08/09/13 5009  Weight: 83.462 kg (184 lb) 85.821 kg (189 lb 3.2 oz) 86.864 kg (191 lb 8 oz)    Exam:   General:  Elderly female in no acute distress  HEENT: No pallor, moist oral mucosa  Chest: Clear to auscultation bilaterally, no added sounds  CVS: Normal S1-S2, no murmurs or gallop  Abdomen: Soft,  nontender, nondistended, bowel sounds present  Extremities: Bilateral leg edema with increased warmth (left greater than right), impriving from yesterday  CNS: AAO x3      Data Reviewed: Basic Metabolic Panel:  Recent Labs Lab 08/08/13 1510 08/09/13 0625 08/10/13 0634  NA 126* 128* 131*  K 4.9 5.1 4.8  CL 91* 96 99  CO2 22 25 26   GLUCOSE 171* 80 92  BUN 11 10 10   CREATININE 0.85 0.96 0.84  CALCIUM 9.4 9.0 9.0   Liver Function Tests:  Recent Labs Lab 08/08/13 1510 08/09/13 0625  AST 28 31  ALT 15 18  ALKPHOS 99 80  BILITOT 1.3* 1.7*  PROT 7.3 6.2  ALBUMIN 2.7* 2.3*    Recent Labs Lab 08/08/13 1510  LIPASE 58   No results found for this basename: AMMONIA,  in the last 168 hours CBC:  Recent Labs Lab 08/08/13 1510 08/09/13 0625 08/10/13 0634  WBC 7.6 19.3* 8.5  NEUTROABS 6.0  --   --   HGB 11.7* 9.4* 9.5*  HCT 36.3 28.5* 29.2*  MCV 79.4 78.9 80.4  PLT 227 155 154   Cardiac Enzymes: No results found for this basename: CKTOTAL, CKMB, CKMBINDEX, TROPONINI,  in the last 168 hours BNP (last 3 results) No results found for this basename: PROBNP,  in the last 8760 hours CBG:  Recent Labs Lab 08/09/13 1635 08/09/13 2038 08/10/13 0743 08/10/13 0926 08/10/13 1150  GLUCAP 92 109* 88 142* 150*    No results found for this or any previous visit (from the past 240 hour(s)).   Studies: Ct Abdomen Pelvis  W Contrast  08/08/2013   CLINICAL DATA Diffuse abdominal pain  EXAM CT ABDOMEN AND PELVIS WITH CONTRAST  TECHNIQUE Multidetector CT imaging of the abdomen and pelvis was performed using the standard protocol following bolus administration of intravenous contrast.  CONTRAST 2mL OMNIPAQUE IOHEXOL 300 MG/ML SOLN, 132mL OMNIPAQUE IOHEXOL 300 MG/ML SOLN  COMPARISON 03/04/2013  FINDINGS BODY WALL: There is a circumscribed dermal lesion in the medial lower left chest wall, most likely a dermal inclusion cyst. Skin thickening and subcutaneous reticulation in the  lower abdominal wall is stable from prior.  LOWER CHEST: Small bilateral pleural effusions.  ABDOMEN/PELVIS:  Liver: Lobulated liver contour consistent with cirrhosis. No evidence of mass. Small volume ascites stable from prior. Portal venous hypertension with large splenorenal shunt.  Biliary: Cholecystectomy.  Stable mild common bile duct enlargement.  Pancreas: Unchanged coarse calcification in the pancreatic uncinate.  Spleen: Unchanged high-density or hypervascularity and anterior spleen, 1 cm in diameter.  Adrenals: Unremarkable.  Kidneys and ureters: No hydronephrosis or stone.  Bladder: Unremarkable.  Reproductive: Hysterectomy. 4 mm cyst in the left ovary is not significantly changed over 13 months.  Bowel: Suspected extended right hemicolectomy. At the ileocolic anastomosis in the central to left abdomen, there is circumferential bowel wall thickening. This was the site of previous bowel obstruction.  Retroperitoneum: Diffuse mesenteric and small bowel adenopathy edema which is likely reactive.  Peritoneum: Small volume ascites stable from prior.  Vascular: Portal hypertension, as above.  OSSEOUS: No acute abnormalities.  IMPRESSION 1. Suspect low grade enteritis at the entero colonic anastomosis in this patient with history of Crohn's disease. 2. Cirrhosis with portal hypertension. 3. Small bilateral pleural effusions.  SIGNATURE  Electronically Signed   By: Jorje Guild M.D.   On: 08/08/2013 16:51   US Venous Img Lower Bilateral  08/09/2013   CLINICAL DATA Swelling.  EXAM BILATERAL LOWER EXTREMITY VENOUS DOPPLER ULTRASOUND  TECHNIQUE Gray-scale sonography with graded compression, as well as color Doppler and duplex ultrasound, were performed to evaluate the deep venous system from the level of the common femoral vein through the popliteal and proximal calf veins. Spectral Doppler was utilized to evaluate flow at rest and with distal augmentation maneuvers.  COMPARISON None.  FINDINGS Thrombus  within deep veins:  None visualized.  Compressibility of deep veins:  Normal.  Duplex waveform respiratory phasicity:  Normal.  Duplex waveform response to augmentation:  Normal.  Venous reflux:  None visualized.  Other findings:  None visualized.  IMPRESSION Unremarkable lower extremity venous ultrasound.  No evidence of DVT.  SIGNATURE  Electronically Signed   By: Rolm Baptise M.D.   On: 08/09/2013 13:54   Dg Abd Acute W/chest  08/08/2013   CLINICAL DATA Abdomen pain, nausea, vomiting  EXAM ACUTE ABDOMEN SERIES (ABDOMEN 2 VIEW & CHEST 1 VIEW)  COMPARISON March 05, 2013, February 09, 2012  FINDINGS There is no evidence of dilated bowel loops or free intraperitoneal air. No radiopaque calculi or other significant radiographic abnormality is seen. Surgical clips are identified in the abdomen. There is prominence of right infrahilar region unchanged compared to prior chest x-ray of September 2013. There is a small left pleural effusion chronic unchanged compared to prior exam 2013. There is no focal pneumonia.  IMPRESSION No bowel obstruction or free air.  No acute cardiopulmonary disease.  SIGNATURE  Electronically Signed   By: Abelardo Diesel M.D.   On: 08/08/2013 15:48    Scheduled Meds: . ALPRAZolam  0.25-0.5 mg Oral Daily  . diltiazem  180 mg Oral Daily  . gabapentin  100 mg Oral QHS  . insulin aspart  0-9 Units Subcutaneous TID WC  . mesalamine  1,000 mg Oral TID  . vancomycin  1,000 mg Intravenous Q12H  . warfarin  1 mg Oral Once  . Warfarin - Pharmacist Dosing Inpatient   Does not apply Q24H   Continuous Infusions: . sodium chloride 75 mL/hr at 08/09/13 1817      Time spent: 25 minutes    Kahmari Herard  [facility] Pager [phone]. If 7PM-7AM, please contact night-coverage at [email or web address], password [facility] 08/10/2013, 1:06 PM  LOS: 2 days

## 2013-08-10 NOTE — Progress Notes (Signed)
Bartholomew for Coumadin (chronic Rx PTA) Indication: atrial fibrillation  Allergies  Allergen Reactions  . Sulfonamide Derivatives Other (See Comments)    Felt funny   Patient Measurements: Height: 5' (152.4 cm) Weight: 191 lb 8 oz (86.864 kg) IBW/kg (Calculated) : 45.5  Vital Signs: Temp: 98.4 F (36.9 C) (03/12 0540) Temp src: Oral (03/12 0540) BP: 117/61 mmHg (03/12 0540) Pulse Rate: 109 (03/12 0540)  Labs:  Recent Labs  08/08/13 1510 08/09/13 0625 08/10/13 0634  HGB 11.7* 9.4* 9.5*  HCT 36.3 28.5* 29.2*  PLT 227 155 154  LABPROT  --  25.4* 26.7*  INR  --  2.40* 2.57*  CREATININE 0.85 0.96 0.84   Estimated Creatinine Clearance: 55.9 ml/min (by C-G formula based on Cr of 0.84).  Medical History: Past Medical History  Diagnosis Date  . Hypertension     x 5 years  . Paroxysmal atrial fibrillation   . Anxiety   . Crohn's disease   . 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 No clear documentation of this  . Diabetes mellitus   . Neuromuscular disorder     periph neuropathy  . Shortness of breath   . Arthritis   . GERD (gastroesophageal reflux disease)   . Blood transfusion   . COPD (chronic obstructive pulmonary disease)   . Cirrhosis     non aloholic   . Pleural effusion 02/13/2012  . RVH (right ventricular hypertrophy) 02/13/2012  . Anemia 02/13/2012  . Chronic respiratory failure 02/15/2012    With hypoxia and hypercapnia.  . Anasarca 01/2012    Ejection fraction 60-65%  . CHF (congestive heart failure)   . Small bowel obstruction    Medications:  Prescriptions prior to admission  Medication Sig Dispense Refill  . acetaminophen (TYLENOL) 500 MG tablet Take 500 mg by mouth every 8 (eight) hours as needed. Pain      . ALPRAZolam (XANAX) 0.5 MG tablet Take 0.25-0.5 mg by mouth daily.      Marland Kitchen diltiazem (CARDIZEM CD) 180 MG 24 hr capsule Take 180 mg by mouth daily.      . furosemide (LASIX) 20 MG  tablet Take 20 mg by mouth daily.      Marland Kitchen gabapentin (NEURONTIN) 100 MG capsule Take 100 mg by mouth at bedtime.      . lansoprazole (PREVACID) 30 MG capsule Take 30 mg by mouth daily as needed (for acid reflux).       . mesalamine (PENTASA) 500 MG CR capsule Take 2 capsules (1,000 mg total) by mouth 3 (three) times daily.  180 capsule  2  . metFORMIN (GLUCOPHAGE) 1000 MG tablet Take 1,000 mg by mouth 2 (two) times daily with a meal.      . Multiple Vitamin (MULTIVITAMIN) tablet Take 1 tablet by mouth daily.        . potassium chloride (K-DUR) 10 MEQ tablet Take 10 mEq by mouth every morning.      Marland Kitchen spironolactone (ALDACTONE) 50 MG tablet Take 50 mg by mouth 2 (two) times daily.      . vitamin C (ASCORBIC ACID) 500 MG tablet Take 500 mg by mouth daily.      Marland Kitchen warfarin (COUMADIN) 1 MG tablet Take 0.5-1 mg by mouth daily. Patient takes 1mg  on Tues.and Thurs. And 0.5mg  all other days      . ferrous sulfate 325 (65 FE) MG tablet Take 325 mg by mouth. Patient takes this medication 3 times  per week       Assessment: 78yo female on chronic Coumadin PTA for afib.  Home dose listed above.  INR is therapeutic.  Goal of Therapy:  INR 2-3 Monitor platelets by anticoagulation protocol: Yes   Plan:   Coumadin 1mg  po today x 1 (home dose)  INR daily.  Hart Robinsons A 08/10/2013,9:04 AM

## 2013-08-10 NOTE — Evaluation (Signed)
Physical Therapy Evaluation Patient Details Name: [name] MRN: [id number] DOB: 20-Dec-1935 Today's Date: 08/10/2013 Time: 2595-6387 PT Time Calculation (min): 28 min  PT Assessment / Plan / Recommendation History of Present Illness  Pt is a 78 year old female with multiple medical problems to include Crohn's Disease, DM, COPD, cirrhosis, CHF and is blind in the left eye.  She developed abdominal pain and is found to have sepsis due to LLE cellulits.  She lives alone with son nearby.  She is generally independent at home.and occasionally uses a cane or walkr "on the days that I feel weak".  Clinical Impression   Pt was seen for evaluation.  She is found to be mildly deconditioned and currently needs a walker for gait.  Her gait with a walker is stable for functional distances.  She has quite a few stairs into her home and we will plan to see her again to ensure stability on those.  I would recommend HHPT at d/c due to generalized deconditioning.   PT Assessment  Patient needs continued PT services    Follow Up Recommendations  Home health PT               Equipment Recommendations  None recommended by PT         Frequency Min 3X/week    Precautions / Restrictions Precautions Precautions: Fall Restrictions Weight Bearing Restrictions: No         Mobility  Bed Mobility Overal bed mobility: Modified Independent Transfers Overall transfer level: Modified independent Equipment used: None Ambulation/Gait Ambulation/Gait assistance: Modified independent (Device/Increase time) Ambulation Distance (Feet): 175 Feet Assistive device: Rolling walker (2 wheeled) Gait Pattern/deviations: WFL(Within Functional Limits) Gait velocity interpretation: Below normal speed for age/gender General Gait Details: gait with a walker is stable...she does currently need a walker for gait due to decontioning         PT Diagnosis: Difficulty walking;Generalized weakness  PT Problem  List: Decreased activity tolerance;Decreased mobility;Obesity PT Treatment Interventions: Gait training;Functional mobility training;Therapeutic exercise     PT Goals(Current goals can be found in the care plan section) Acute Rehab PT Goals Patient Stated Goal: none stated PT Goal Formulation: With patient Time For Goal Achievement: 08/24/13 Potential to Achieve Goals: Good  Visit Information  Last PT Received On: 08/10/13 History of Present Illness: Pt is a 78 year old female with multiple medical problems to include Crohn's Disease, DM, COPD, cirrhosis, CHF and is blind in the left eye.  She developed abdominal pain and is found to have sepsis due to LLE cellulits.  She lives alone with son nearby.  She is generally independent at home.and occasionally uses a cane or walkr "on the days that I feel weak".       Prior [place] expects to be discharged to:: Private residence Living Arrangements: Alone Available Help at Discharge: Family;Available PRN/intermittently Type of Home: House Home Access: Stairs to enter [employer] of Steps: 5 Entrance Stairs-Rails: Right Home Layout: One level Home Equipment: Walker - 2 wheels;Cane - single point;Grab bars - tub/shower Prior Function Level of Independence: Independent with assistive device(s) Comments: uses a cane or walker when feeling weak Communication Communication: No difficulties    Cognition  Cognition Arousal/Alertness: Awake/alert Behavior During Therapy: WFL for tasks assessed/performed Overall Cognitive Status: Within Functional Limits for tasks assessed    Extremity/Trunk Assessment Lower Extremity Assessment Lower Extremity Assessment: Overall WFL for tasks assessed   Balance Balance Overall balance assessment: No apparent balance deficits (  not formally assessed)  End of Session PT - End of Session Equipment Utilized During Treatment: Gait belt Activity Tolerance: Patient  tolerated treatment well Patient left: in chair;with call bell/phone within reach;with chair alarm set  GP     Sable Feil 08/10/2013, 4:16 PM

## 2013-08-11 LAB — GLUCOSE, CAPILLARY: Glucose-Capillary: 129 mg/dL — ABNORMAL HIGH (ref 70–99)

## 2013-08-11 LAB — PROTIME-INR
INR: 2.37 — ABNORMAL HIGH (ref 0.00–1.49)
Prothrombin Time: 25.1 seconds — ABNORMAL HIGH (ref 11.6–15.2)

## 2013-08-11 LAB — VANCOMYCIN, TROUGH: Vancomycin Tr: 20.3 ug/mL — ABNORMAL HIGH (ref 10.0–20.0)

## 2013-08-11 MED ORDER — WARFARIN SODIUM 1 MG PO TABS: 0.5000 mg | ORAL_TABLET | ORAL | Status: DC

## 2013-08-11 MED ORDER — DOXYCYCLINE HYCLATE 100 MG PO TABS: 100.0000 mg | ORAL_TABLET | Freq: Two times a day (BID) | ORAL | Status: DC

## 2013-08-11 MED ORDER — WARFARIN SODIUM 1 MG PO TABS: 1.0000 mg | ORAL_TABLET | ORAL | Status: DC

## 2013-08-11 NOTE — Progress Notes (Signed)
08/10/13 2353  Vitals  BP ! 130/58 mmHg  MAP (mmHg) 76  BP Location Left arm  BP Method Automatic  Patient Position, if appropriate Lying  Pulse Rate 88  Pulse Rate Source Radial  Received alert from telemetry monitor that patient was reading Vtach on the monitor. Patient was resting in room with no complaints of chest pain. Patient stated she wanted to rest. VS were within normal limits. Checked tele strip did not notice Vtach. Patient is now in AFib which is her baseline. Will continue to monitor patient.

## 2013-08-11 NOTE — Discharge Instructions (Signed)
Cellulitis °Cellulitis is an infection of the skin and the tissue under the skin. The infected area is usually red and tender. This happens most often in the arms and lower legs. °HOME CARE  °· Take your antibiotic medicine as told. Finish the medicine even if you start to feel better. °· Keep the infected arm or leg raised (elevated). °· Put a warm cloth on the area up to 4 times per day. °· Only take medicines as told by your doctor. °· Keep all doctor visits as told. °GET HELP RIGHT AWAY IF:  °· You have a fever. °· You feel very sleepy. °· You throw up (vomit) or have watery poop (diarrhea). °· You feel sick and have muscle aches and pains. °· You see red streaks on the skin coming from the infected area. °· Your red area gets bigger or turns a dark color. °· Your bone or joint under the infected area is painful after the skin heals. °· Your infection comes back in the same area or different area. °· You have a puffy (swollen) bump in the infected area. °· You have new symptoms. °MAKE SURE YOU:  °· Understand these instructions. °· Will watch your condition. °· Will get help right away if you are not doing well or get worse. °Document Released: 11/04/2007 Document Revised: 11/17/2011 Document Reviewed: 08/03/2011 °[employer]® Patient Information ©2014 [employer]. ° °

## 2013-08-11 NOTE — Discharge Summary (Addendum)
Physician Discharge Summary  [name] WNI:[id number] DOB: 1935-06-30 DOA: 08/08/2013  PCP: Redge Gainer, MD  Admit date: 08/08/2013 Discharge date: 08/11/2013  Time spent: 40 minutes  Recommendations for Outpatient Follow-up:  1. Home with [facility] and PT  Discharge Diagnoses:  Principal Problem:   sepsis  Active Problems:   Cellulitis of leg   Atrial fibrillation   COPD    Anticoagulant long-term use   Anemia   Abdominal pain   Hyponatremia   Discharge Condition: Fair  Diet recommendation: cardiac  Filed Weights   08/08/13 1233 08/08/13 2142 08/09/13 3818  Weight: 83.462 kg (184 lb) 85.821 kg (189 lb 3.2 oz) 86.864 kg (191 lb 8 oz)    History of present illness:  Sepsis due to Bilateral LE cellulitis ( left >right)  Patient placed on empiric vancomycin. Clinically improving. Leukocytosis resolved.  I will discharge her on oral doxycycline 100 mg twice a day to complete a 10 day course.  Lactic acidosis  Secondary to sepsis. Resolved with hydration. Resume metformin upon discharge  Abdominal pain  CT scan showed enteritis at the level of ileocolic junction. No further abdominal pain after she had bowel movement today. Lactic acidosis resolved.  She had associated nausea and vomiting on presentation which has now resolved. Tolerating advanced diet. -she  has underlying Crohn's disease. Continue mesalamine   Diabetes mellitus  Stable. Resume metformin.  Hyponatremia  Possibly prerenal and being on Lasix.  Improved in a.m. labs   Paroxysmal A. fib  HR stable. continue cardizem. on chronic Coumadin with therapeutic INR   NAFLD  stable liver enzymes  Code Status: partial  Family Communication: Spoke with daughter over the phone Disposition Plan: Patient lives by herself. Her daughter lives nearby and takes care of her. Seen by physical therapy and recommended outpatient PT. We'll arrange for home health PT and visiting nurse.  Consultants:   GI Procedures:  none Antibiotics:  IV vancomycin(3/10-3/13)   Discharge Exam: Filed Vitals:   08/11/13 0544  BP: 111/62  Pulse: 86  Temp: 97.6 F (36.4 C)  Resp: 12   General: Elderly female in no acute distress  HEENT: No pallor, moist oral mucosa  Chest: Clear to auscultation bilaterally, no added sounds  CVS: Normal S1-S2, no murmurs or gallop  Abdomen: Soft, nontender, nondistended, bowel sounds present  Extremities: Bilateral leg edema and erythema (left >right), or warmth and much improved from previous days CNS: AAO x3    Discharge Instructions   Future Appointments Provider Department Dept Phone   09/27/2013 8:30 AM Chipper Herb, MD [place] [phone]   11/27/2013 3:00 PM Rogene Houston, MD [place] [phone]       Medication List         acetaminophen 500 MG tablet  Commonly known as:  TYLENOL  Take 500 mg by mouth every 8 (eight) hours as needed. Pain     ALPRAZolam 0.5 MG tablet  Commonly known as:  XANAX  Take 0.25-0.5 mg by mouth daily.     diltiazem 180 MG 24 hr capsule  Commonly known as:  CARDIZEM CD  Take 180 mg by mouth daily.     doxycycline 100 MG tablet  Commonly known as:  VIBRA-TABS  Take 1 tablet (100 mg total) by mouth 2 (two) times daily. (until 3/21)     ferrous sulfate 325 (65 FE) MG tablet  Take 325 mg by mouth. Patient takes this medication 3 times per week  furosemide 20 MG tablet  Commonly known as:  LASIX  Take 20 mg by mouth daily.     gabapentin 100 MG capsule  Commonly known as:  NEURONTIN  Take 100 mg by mouth at bedtime.     lansoprazole 30 MG capsule  Commonly known as:  PREVACID  Take 30 mg by mouth daily as needed (for acid reflux).     mesalamine 500 MG CR capsule  Commonly known as:  PENTASA  Take 2 capsules (1,000 mg total) by mouth 3 (three) times daily.     metFORMIN 1000 MG tablet  Commonly known as:  GLUCOPHAGE  Take 1,000 mg by mouth  2 (two) times daily with a meal.     multivitamin tablet  Take 1 tablet by mouth daily.     potassium chloride 10 MEQ tablet  Commonly known as:  K-DUR  Take 10 mEq by mouth every morning.     spironolactone 50 MG tablet  Commonly known as:  ALDACTONE  Take 50 mg by mouth 2 (two) times daily.     vitamin C 500 MG tablet  Commonly known as:  ASCORBIC ACID  Take 500 mg by mouth daily.     warfarin 1 MG tablet  Commonly known as:  COUMADIN  Take 0.5-1 mg by mouth daily. Patient takes 1mg  on Tues.and Thurs. And 0.5mg  all other days       Allergies  Allergen Reactions  . Sulfonamide Derivatives Other (See Comments)    Felt funny       Follow-up Information   Follow up with Redge Gainer, MD. Schedule an appointment as soon as possible for a visit in 1 week.   Specialty:  Family Medicine   Contact information:   [address] [place] [address] [phone]        The results of significant diagnostics from this hospitalization (including imaging, microbiology, ancillary and laboratory) are listed below for reference.    Significant Diagnostic Studies: Ct Abdomen Pelvis W Contrast  08/08/2013   CLINICAL DATA Diffuse abdominal pain  EXAM CT ABDOMEN AND PELVIS WITH CONTRAST  TECHNIQUE Multidetector CT imaging of the abdomen and pelvis was performed using the standard protocol following bolus administration of intravenous contrast.  CONTRAST 25mL OMNIPAQUE IOHEXOL 300 MG/ML SOLN, 149mL OMNIPAQUE IOHEXOL 300 MG/ML SOLN  COMPARISON 03/04/2013  FINDINGS BODY WALL: There is a circumscribed dermal lesion in the medial lower left chest wall, most likely a dermal inclusion cyst. Skin thickening and subcutaneous reticulation in the lower abdominal wall is stable from prior.  LOWER CHEST: Small bilateral pleural effusions.  ABDOMEN/PELVIS:  Liver: Lobulated liver contour consistent with cirrhosis. No evidence of mass. Small volume ascites stable from prior. Portal venous hypertension  with large splenorenal shunt.  Biliary: Cholecystectomy.  Stable mild common bile duct enlargement.  Pancreas: Unchanged coarse calcification in the pancreatic uncinate.  Spleen: Unchanged high-density or hypervascularity and anterior spleen, 1 cm in diameter.  Adrenals: Unremarkable.  Kidneys and ureters: No hydronephrosis or stone.  Bladder: Unremarkable.  Reproductive: Hysterectomy. 4 mm cyst in the left ovary is not significantly changed over 13 months.  Bowel: Suspected extended right hemicolectomy. At the ileocolic anastomosis in the central to left abdomen, there is circumferential bowel wall thickening. This was the site of previous bowel obstruction.  Retroperitoneum: Diffuse mesenteric and small bowel adenopathy edema which is likely reactive.  Peritoneum: Small volume ascites stable from prior.  Vascular: Portal hypertension, as above.  OSSEOUS: No acute abnormalities.  IMPRESSION 1.  Suspect low grade enteritis at the entero colonic anastomosis in this patient with history of Crohn's disease. 2. Cirrhosis with portal hypertension. 3. Small bilateral pleural effusions.  SIGNATURE  Electronically Signed   By: Jorje Guild M.D.   On: 08/08/2013 16:51   US Venous Img Lower Bilateral  08/09/2013   CLINICAL DATA Swelling.  EXAM BILATERAL LOWER EXTREMITY VENOUS DOPPLER ULTRASOUND  TECHNIQUE Gray-scale sonography with graded compression, as well as color Doppler and duplex ultrasound, were performed to evaluate the deep venous system from the level of the common femoral vein through the popliteal and proximal calf veins. Spectral Doppler was utilized to evaluate flow at rest and with distal augmentation maneuvers.  COMPARISON None.  FINDINGS Thrombus within deep veins:  None visualized.  Compressibility of deep veins:  Normal.  Duplex waveform respiratory phasicity:  Normal.  Duplex waveform response to augmentation:  Normal.  Venous reflux:  None visualized.  Other findings:  None visualized.  IMPRESSION  Unremarkable lower extremity venous ultrasound.  No evidence of DVT.  SIGNATURE  Electronically Signed   By: Rolm Baptise M.D.   On: 08/09/2013 13:54   Dg Abd Acute W/chest  08/08/2013   CLINICAL DATA Abdomen pain, nausea, vomiting  EXAM ACUTE ABDOMEN SERIES (ABDOMEN 2 VIEW & CHEST 1 VIEW)  COMPARISON March 05, 2013, February 09, 2012  FINDINGS There is no evidence of dilated bowel loops or free intraperitoneal air. No radiopaque calculi or other significant radiographic abnormality is seen. Surgical clips are identified in the abdomen. There is prominence of right infrahilar region unchanged compared to prior chest x-ray of September 2013. There is a small left pleural effusion chronic unchanged compared to prior exam 2013. There is no focal pneumonia.  IMPRESSION No bowel obstruction or free air.  No acute cardiopulmonary disease.  SIGNATURE  Electronically Signed   By: Abelardo Diesel M.D.   On: 08/08/2013 15:48    Microbiology: No results found for this or any previous visit (from the past 240 hour(s)).   Labs: Basic Metabolic Panel:  Recent Labs Lab 08/08/13 1510 08/09/13 0625 08/10/13 0634  NA 126* 128* 131*  K 4.9 5.1 4.8  CL 91* 96 99  CO2 22 25 26   GLUCOSE 171* 80 92  BUN 11 10 10   CREATININE 0.85 0.96 0.84  CALCIUM 9.4 9.0 9.0   Liver Function Tests:  Recent Labs Lab 08/08/13 1510 08/09/13 0625  AST 28 31  ALT 15 18  ALKPHOS 99 80  BILITOT 1.3* 1.7*  PROT 7.3 6.2  ALBUMIN 2.7* 2.3*    Recent Labs Lab 08/08/13 1510  LIPASE 58   No results found for this basename: AMMONIA,  in the last 168 hours CBC:  Recent Labs Lab 08/08/13 1510 08/09/13 0625 08/10/13 0634  WBC 7.6 19.3* 8.5  NEUTROABS 6.0  --   --   HGB 11.7* 9.4* 9.5*  HCT 36.3 28.5* 29.2*  MCV 79.4 78.9 80.4  PLT 227 155 154   Cardiac Enzymes: No results found for this basename: CKTOTAL, CKMB, CKMBINDEX, TROPONINI,  in the last 168 hours BNP: BNP (last 3 results) No results found for this  basename: PROBNP,  in the last 8760 hours CBG:  Recent Labs Lab 08/10/13 0926 08/10/13 1150 08/10/13 1647 08/10/13 2147 08/11/13 0730  GLUCAP 142* 150* 110* 114* 129*       Signed:  Daleena Rotter  [facility] 08/11/2013, 10:55 AM

## 2013-08-11 NOTE — Progress Notes (Signed)
[place] for Coumadin (chronic Rx PTA) Indication: atrial fibrillation  Allergies  Allergen Reactions  . Sulfonamide Derivatives Other (See Comments)    Felt funny   Patient Measurements: Height: 5' (152.4 cm) Weight: 191 lb 8 oz (86.864 kg) IBW/kg (Calculated) : 45.5  Vital Signs: Temp: 97.6 F (36.4 C) (03/13 0544) Temp src: Oral (03/13 0544) BP: 111/62 mmHg (03/13 0544) Pulse Rate: 86 (03/13 0544)  Labs:  Recent Labs  08/08/13 1510 08/09/13 0625 08/10/13 0634 08/11/13 0500  HGB 11.7* 9.4* 9.5*  --   HCT 36.3 28.5* 29.2*  --   PLT 227 155 154  --   LABPROT  --  25.4* 26.7* 25.1*  INR  --  2.40* 2.57* 2.37*  CREATININE 0.85 0.96 0.84  --    Estimated Creatinine Clearance: 55.9 ml/min (by C-G formula based on Cr of 0.84).  Medical History: Past Medical History  Diagnosis Date  . Hypertension     x 5 years  . Paroxysmal atrial fibrillation   . Anxiety   . Crohn's disease   . 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 No clear documentation of this  . Diabetes mellitus   . Neuromuscular disorder     periph neuropathy  . Shortness of breath   . Arthritis   . GERD (gastroesophageal reflux disease)   . Blood transfusion   . COPD (chronic obstructive pulmonary disease)   . Cirrhosis     non aloholic   . Pleural effusion 02/13/2012  . RVH (right ventricular hypertrophy) 02/13/2012  . Anemia 02/13/2012  . Chronic respiratory failure 02/15/2012    With hypoxia and hypercapnia.  . Anasarca 01/2012    Ejection fraction 60-65%  . CHF (congestive heart failure)   . Small bowel obstruction    Medications:  Prescriptions prior to admission  Medication Sig Dispense Refill  . acetaminophen (TYLENOL) 500 MG tablet Take 500 mg by mouth every 8 (eight) hours as needed. Pain      . ALPRAZolam (XANAX) 0.5 MG tablet Take 0.25-0.5 mg by mouth daily.      Marland Kitchen diltiazem (CARDIZEM CD) 180 MG 24 hr capsule Take 180 mg by  mouth daily.      . furosemide (LASIX) 20 MG tablet Take 20 mg by mouth daily.      Marland Kitchen gabapentin (NEURONTIN) 100 MG capsule Take 100 mg by mouth at bedtime.      . lansoprazole (PREVACID) 30 MG capsule Take 30 mg by mouth daily as needed (for acid reflux).       . mesalamine (PENTASA) 500 MG CR capsule Take 2 capsules (1,000 mg total) by mouth 3 (three) times daily.  180 capsule  2  . metFORMIN (GLUCOPHAGE) 1000 MG tablet Take 1,000 mg by mouth 2 (two) times daily with a meal.      . Multiple Vitamin (MULTIVITAMIN) tablet Take 1 tablet by mouth daily.        . potassium chloride (K-DUR) 10 MEQ tablet Take 10 mEq by mouth every morning.      Marland Kitchen spironolactone (ALDACTONE) 50 MG tablet Take 50 mg by mouth 2 (two) times daily.      . vitamin C (ASCORBIC ACID) 500 MG tablet Take 500 mg by mouth daily.      Marland Kitchen warfarin (COUMADIN) 1 MG tablet Take 0.5-1 mg by mouth daily. Patient takes 1mg  on Tues.and Thurs. And 0.5mg  all other days      . ferrous sulfate  325 (65 FE) MG tablet Take 325 mg by mouth. Patient takes this medication 3 times per week       Assessment: 78yo female on chronic Coumadin PTA for afib.  Home dose listed above.  INR is therapeutic x 3 consecutive days and appears relatively stable.  Goal of Therapy:  INR 2-3 Monitor platelets by anticoagulation protocol: Yes   Plan:   Coumadin 0.5mg  po daily except 1mg  on Tue-Thur (home dose)  INR on Mon-Wed-Fri  Hart Robinsons A 08/11/2013,11:01 AM

## 2013-08-11 NOTE — Consult Note (Signed)
Reason for Consult: Crohn's Referring Physician: Hospitalist.  [name] is an 78 y.o. female.  HPI: Admitted thru the ED 08/08/2013 with c/o nausea, vomiting and abdominal pain. She actually thought she may have had a SBO. Since admission there has been no further N or V. She did have a BM during the night. She says stool was small and formed. This morning she denies having any abdominal pain. She is passing flatus.  There was no fever associated with her symptoms.  She is sitting up tolerating her breakfast.  She says she has been taking her Mesalamine on a regular basis.   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 Both of these strictures were diagnosed in 2011 at [facility] in [facility] and most recently at this facility in March 2014. She was noted to have ulcers distal to ileocolonic anastomosis and few erosions proximal to anastomosis. Stricture was 10 mm and dilated to 13.5 mm. Distal rectal stricture was dilated to 15 mm. She also had EGD at the time of colonoscopy revealing soft stricture at GE junction which was dilated by passing 56 [place] Maloney dilator. There was no evidence of varices or peptic ulcer disease.  Patient also has cirrhosis secondary to NAFLD. She had normal AFP on 01/24/2013 and CT from this admission did not show any suspicious lesions in her liver.       Past Medical History  Diagnosis Date  . Hypertension     x 5 years  . Paroxysmal atrial fibrillation   . Anxiety   . Crohn's disease   . 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 No clear documentation of this  . Diabetes mellitus   . Neuromuscular disorder     periph neuropathy  . Shortness of breath   . Arthritis   . GERD (gastroesophageal reflux disease)   . Blood transfusion   . COPD (chronic obstructive pulmonary disease)   . Cirrhosis     non aloholic   . Pleural effusion 02/13/2012  . RVH (right ventricular  hypertrophy) 02/13/2012  . Anemia 02/13/2012  . Chronic respiratory failure 02/15/2012    With hypoxia and hypercapnia.  . Anasarca 01/2012    Ejection fraction 60-65%  . CHF (congestive heart failure)   . Small bowel obstruction     Past Surgical History  Procedure Laterality Date  . Basal cell carcinoma excision    . Abdominal surgery      multiple  . Vesicovaginal fistula closure w/ tah    . Cancers resected    . Appendectomy    . Abdominal hysterectomy    . Tubal ligation    . Cholecystectomy    . Colon surgery      hx bleeding ulcer  . Mass excision  06/23/2011    Procedure: EXCISION MASS;  Surgeon: Tennis Must, MD;  Location: [place];  Service: Orthopedics;  Laterality: Left;  left hand excision mass with acell placement  . Left hand  06/23/11    Removed Squamous cell , and also did graft  . Mass excision  11/02/2011    Procedure: EXCISION MASS;  Surgeon: Tennis Must, MD;  Location: [place];  Service: Orthopedics;  Laterality: Left;  EXCISION MASS LEFT HAND with full thickness skin graft from left upper arm  . Colonoscopy with esophagogastroduodenoscopy (egd) N/A 08/01/2012    Procedure: COLONOSCOPY WITH ESOPHAGOGASTRODUODENOSCOPY (EGD);  Surgeon: Rogene Houston, MD;  [facility];  Service: Endoscopy;  Laterality: N/A;  730  . Balloon dilation N/A 08/01/2012    Procedure: BALLOON DILATION;  Surgeon: Rogene Houston, MD;  [facility];  Service: Endoscopy;  Laterality: N/A;  Venia Minks dilation N/A 08/01/2012    Procedure: Venia Minks DILATION;  Surgeon: Rogene Houston, MD;  [facility];  Service: Endoscopy;  Laterality: N/A;  . Savory dilation N/A 08/01/2012    Procedure: SAVORY DILATION;  Surgeon: Rogene Houston, MD;  [facility];  Service: Endoscopy;  Laterality: N/A;    Family History  Problem Relation Age of Onset  . Coronary artery disease Father   . Heart attack Father   . Coronary artery  disease Mother   . Coronary artery disease Brother   . Heart attack Brother   . Pancreatic cancer Brother   . Heart attack Brother   . Leukemia Daughter   . Healthy Daughter   . Colon cancer Neg Hx     Social History:  reports that she has been smoking Cigarettes.  She started smoking about 50 years ago. She has a 20 pack-year smoking history. She has never used smokeless tobacco. She reports that she does not drink alcohol or use illicit drugs.  Allergies:  Allergies  Allergen Reactions  . Sulfonamide Derivatives Other (See Comments)    Felt funny    Medications: I have reviewed the patient's current medications.  Results for orders placed during the hospital encounter of 08/08/13 (from the past 48 hour(s))  LACTIC ACID, PLASMA     Status: Abnormal   Collection Time    08/09/13  9:47 AM      Result Value Ref Range   Lactic Acid, Venous 2.7 (*) 0.5 - 2.2 mmol/L  GLUCOSE, CAPILLARY     Status: None   Collection Time    08/09/13 11:46 AM      Result Value Ref Range   Glucose-Capillary 94  70 - 99 mg/dL  GLUCOSE, CAPILLARY     Status: None   Collection Time    08/09/13  4:35 PM      Result Value Ref Range   Glucose-Capillary 92  70 - 99 mg/dL   Comment 1 Notify RN     Comment 2 Documented in Chart    OSMOLALITY, URINE     Status: Abnormal   Collection Time    08/09/13  5:50 PM      Result Value Ref Range   Osmolality, Ur 303 (*) 390 - 1090 mOsm/kg   Comment: Performed at [facility], CAPILLARY     Status: Abnormal   Collection Time    08/09/13  8:38 PM      Result Value Ref Range   Glucose-Capillary 109 (*) 70 - 99 mg/dL   Comment 1 Notify RN     Comment 2 Documented in Chart    PROTIME-INR     Status: Abnormal   Collection Time    08/10/13  6:34 AM      Result Value Ref Range   Prothrombin Time 26.7 (*) 11.6 - 15.2 seconds   INR 2.57 (*) 0.00 - 1.49  CBC     Status: Abnormal   Collection Time    08/10/13  6:34 AM      Result Value Ref Range    WBC 8.5  4.0 - 10.5 K/uL   RBC 3.63 (*) 3.87 - 5.11 MIL/uL   Hemoglobin 9.5 (*) 12.0 -  15.0 g/dL   HCT 29.2 (*) 36.0 - 46.0 %   MCV 80.4  78.0 - 100.0 fL   MCH 26.2  26.0 - 34.0 pg   MCHC 32.5  30.0 - 36.0 g/dL   RDW 17.5 (*) 11.5 - 15.5 %   Platelets 154  150 - 400 K/uL  BASIC METABOLIC PANEL     Status: Abnormal   Collection Time    08/10/13  6:34 AM      Result Value Ref Range   Sodium 131 (*) 137 - 147 mEq/L   Potassium 4.8  3.7 - 5.3 mEq/L   Chloride 99  96 - 112 mEq/L   CO2 26  19 - 32 mEq/L   Glucose, Bld 92  70 - 99 mg/dL   BUN 10  6 - 23 mg/dL   Creatinine, Ser 0.84  0.50 - 1.10 mg/dL   Calcium 9.0  8.4 - 10.5 mg/dL   GFR calc non Af Amer 66 (*) >90 mL/min   GFR calc Af Amer 76 (*) >90 mL/min   Comment: (NOTE)     The eGFR has been calculated using the CKD EPI equation.     This calculation has not been validated in all clinical situations.     eGFR's persistently <90 mL/min signify possible Chronic Kidney     Disease.  LACTIC ACID, PLASMA     Status: None   Collection Time    08/10/13  6:36 AM      Result Value Ref Range   Lactic Acid, Venous 1.4  0.5 - 2.2 mmol/L  GLUCOSE, CAPILLARY     Status: None   Collection Time    08/10/13  7:43 AM      Result Value Ref Range   Glucose-Capillary 88  70 - 99 mg/dL  GLUCOSE, CAPILLARY     Status: Abnormal   Collection Time    08/10/13  9:26 AM      Result Value Ref Range   Glucose-Capillary 142 (*) 70 - 99 mg/dL   Comment 1 Notify RN    GLUCOSE, CAPILLARY     Status: Abnormal   Collection Time    08/10/13 11:50 AM      Result Value Ref Range   Glucose-Capillary 150 (*) 70 - 99 mg/dL  GLUCOSE, CAPILLARY     Status: Abnormal   Collection Time    08/10/13  4:47 PM      Result Value Ref Range   Glucose-Capillary 110 (*) 70 - 99 mg/dL   Comment 1 Notify RN    GLUCOSE, CAPILLARY     Status: Abnormal   Collection Time    08/10/13  9:47 PM      Result Value Ref Range   Glucose-Capillary 114 (*) 70 - 99 mg/dL    Comment 1 Notify RN     Comment 2 Documented in Chart    PROTIME-INR     Status: Abnormal   Collection Time    08/11/13  5:00 AM      Result Value Ref Range   Prothrombin Time 25.1 (*) 11.6 - 15.2 seconds   INR 2.37 (*) 0.00 - 1.49  GLUCOSE, CAPILLARY     Status: Abnormal   Collection Time    08/11/13  7:30 AM      Result Value Ref Range   Glucose-Capillary 129 (*) 70 - 99 mg/dL   Comment 1 Documented in Chart     Comment 2 Notify RN      US Venous  Img Lower Bilateral  08/09/2013   CLINICAL DATA Swelling.  EXAM BILATERAL LOWER EXTREMITY VENOUS DOPPLER ULTRASOUND  TECHNIQUE Gray-scale sonography with graded compression, as well as color Doppler and duplex ultrasound, were performed to evaluate the deep venous system from the level of the common femoral vein through the popliteal and proximal calf veins. Spectral Doppler was utilized to evaluate flow at rest and with distal augmentation maneuvers.  COMPARISON None.  FINDINGS Thrombus within deep veins:  None visualized.  Compressibility of deep veins:  Normal.  Duplex waveform respiratory phasicity:  Normal.  Duplex waveform response to augmentation:  Normal.  Venous reflux:  None visualized.  Other findings:  None visualized.  IMPRESSION Unremarkable lower extremity venous ultrasound.  No evidence of DVT.  SIGNATURE  Electronically Signed   By: Rolm Baptise M.D.   On: 08/09/2013 13:54     CT abdomen/pelvis: 08/09/2013 IMPRESSION  1. Suspect low grade enteritis at the entero colonic anastomosis in  this patient with history of Crohn's disease.  2. Cirrhosis with portal hypertension.  3. Small bilateral pleural effusions.    ROS Blood pressure 111/62, pulse 86, temperature 97.6 F (36.4 C), temperature source Oral, resp. rate 12, height 5' (1.524 m), weight 191 lb 8 oz (86.864 kg), SpO2 92.00%.   Physical Exam Patient is alert. Sitting up eating breakfast. Lung fields are clear. HR regular. Abdomen is soft. BS+. There is not  tenderness on palpatation.  2+ edema to lower extremities. Left lower extremity reddened. Cellulitis.   Assessment/Plan: Crohn's disease. Recent CT revealed low grade enteritis.  Patient feels much better today. Tolerating diet. No nausea or vomiting. No abdominal pain. Agree with Mesalamine 1,073m TID. Will continue to monitor.    SNolberto Hanlon3/13/2015, 9:46 AM     GI attending note; Patient interviewed and current and prior CT from October 2014 reviewed with Dr. BThornton Papas Current CT does not show any evidence of small bowel dilation proximal to anastomosis like she had back in October 2014. Wall thickening in the region of anastomosis appears to be chronic. At this point do not see any indication for steroids. Agree with DC plans and will see patient in the office in 2 weeks.

## 2013-08-14 ENCOUNTER — Ambulatory Visit (INDEPENDENT_AMBULATORY_CARE_PROVIDER_SITE_OTHER): Payer: Medicare Other | Admitting: Family Medicine

## 2013-08-14 ENCOUNTER — Encounter: Payer: Self-pay | Admitting: Family Medicine

## 2013-08-14 VITALS — BP 94/49 | HR 74 | Temp 98.1°F | Ht 59.0 in | Wt 194.0 lb

## 2013-08-14 DIAGNOSIS — R1012 Left upper quadrant pain: Secondary | ICD-10-CM

## 2013-08-14 LAB — POCT CBC
Granulocyte percent: 73.4 %G (ref 37–80)
HCT, POC: 32.3 % — AB (ref 37.7–47.9)
Hemoglobin: 9.9 g/dL — AB (ref 12.2–16.2)
Lymph, poc: 1.2 (ref 0.6–3.4)
MCH, POC: 24.1 pg — AB (ref 27–31.2)
MCHC: 30.6 g/dL — AB (ref 31.8–35.4)
MCV: 78.6 fL — AB (ref 80–97)
MPV: 6.8 fL (ref 0–99.8)
POC Granulocyte: 5.1 (ref 2–6.9)
POC LYMPH PERCENT: 16.9 %L (ref 10–50)
Platelet Count, POC: 252 10*3/uL (ref 142–424)
RBC: 4.1 M/uL (ref 4.04–5.48)
RDW, POC: 19.2 %
WBC: 6.9 10*3/uL (ref 4.6–10.2)

## 2013-08-14 MED ORDER — HYDROCODONE-ACETAMINOPHEN 5-325 MG PO TABS: ORAL_TABLET | ORAL | Status: DC

## 2013-08-14 NOTE — Progress Notes (Signed)
   Subjective:    Patient ID: [name], female    DOB: 05-01-1936, 78 y.o.   MRN: [id number]  HPI This 78 y.o. female presents for evaluation of abdominal pain.  She was recently hospitalized for Lower extremity cellulitis and SIRS.  She was started on IV abx's and discharged home on Doxycycline.  She has hx of NAFLD and cirrhosis and sees GI.  She was also dx with colitis and  Lactic acidosis.  She has been tolerating advanced diet and has been having BM.   Review of Systems C/o abdominal pain No chest pain, SOB, HA, dizziness, vision change, N/V, diarrhea, constipation, dysuria, urinary urgency or frequency, myalgias, arthralgias or rash.     Objective:   Physical Exam  Vital signs noted  Chronically ill appearing female.  HEENT - Head atraumatic Normocephalic                Eyes - PERRLA, Conjuctiva - clear Sclera- Clear EOMI Respiratory - Lungs CTA bilateral Cardiac - Irregular rate and rhythm  S1 and S2 without murmur GI - Abdomen soft Nontender and bowel sounds active x 4 Extremities - Anasarca edema w/o cellulitis Neuro - Grossly intact.  Results for orders placed in visit on 08/14/13  POCT CBC      Result Value Ref Range   WBC 6.9  4.6 - 10.2 K/uL   Lymph, poc 1.2  0.6 - 3.4   POC LYMPH PERCENT 16.9  10 - 50 %L   POC Granulocyte 5.1  2 - 6.9   Granulocyte percent 73.4  37 - 80 %G   RBC 4.1  4.04 - 5.48 M/uL   Hemoglobin 9.9 (*) 12.2 - 16.2 g/dL   HCT, POC 32.3 (*) 37.7 - 47.9 %   MCV 78.6 (*) 80 - 97 fL   MCH, POC 24.1 (*) 27 - 31.2 pg   MCHC 30.6 (*) 31.8 - 35.4 g/dL   RDW, POC 19.2     Platelet Count, POC 252.0  142 - 424 K/uL   MPV 6.8  0 - 99.8 fL      Assessment & Plan:  Abdominal pain, left upper quadrant - Plan: POCT CBC, Ambulatory referral to Gastroenterology Norco 5mg  one po qid prn #40  Anemia - Stable  Cellulitis - Improved and continue doxycycline.  Lysbeth Penner FNP

## 2013-08-16 ENCOUNTER — Other Ambulatory Visit: Payer: Self-pay | Admitting: Family Medicine

## 2013-08-16 ENCOUNTER — Telehealth: Payer: Self-pay | Admitting: Family Medicine

## 2013-08-16 NOTE — Telephone Encounter (Signed)
Intermittent abdominal x 2 weeks. Has been evaluated at the hospital and patient was seen yesterday. She is anemic but hasn't noticed any blood in her stools. She has an appt with Dr. Laural Golden on 08/21/13. I don't think there is anything else we can evaluate here in the office. Encouraged her to go to the ED if her pain worsens. Keep appt with Dr. Laural Golden.

## 2013-08-17 ENCOUNTER — Encounter (HOSPITAL_COMMUNITY): Payer: Self-pay | Admitting: Emergency Medicine

## 2013-08-17 ENCOUNTER — Inpatient Hospital Stay (HOSPITAL_COMMUNITY)
Admission: EM | Admit: 2013-08-17 | Discharge: 2013-08-19 | DRG: 386 | Disposition: A | Discharge status: Home / Self Care | Payer: Medicare Other | Attending: Family Medicine | Admitting: Family Medicine

## 2013-08-17 ENCOUNTER — Emergency Department (HOSPITAL_COMMUNITY): Payer: Medicare Other

## 2013-08-17 DIAGNOSIS — Z8249 Family history of ischemic heart disease and other diseases of the circulatory system: Secondary | ICD-10-CM

## 2013-08-17 DIAGNOSIS — K5000 Crohn's disease of small intestine without complications: Principal | ICD-10-CM | POA: Diagnosis present

## 2013-08-17 DIAGNOSIS — E875 Hyperkalemia: Secondary | ICD-10-CM | POA: Diagnosis present

## 2013-08-17 DIAGNOSIS — I4891 Unspecified atrial fibrillation: Secondary | ICD-10-CM | POA: Diagnosis present

## 2013-08-17 DIAGNOSIS — H5440 Blindness, one eye, unspecified eye: Secondary | ICD-10-CM | POA: Diagnosis present

## 2013-08-17 DIAGNOSIS — E871 Hypo-osmolality and hyponatremia: Secondary | ICD-10-CM | POA: Diagnosis present

## 2013-08-17 DIAGNOSIS — F17200 Nicotine dependence, unspecified, uncomplicated: Secondary | ICD-10-CM | POA: Diagnosis present

## 2013-08-17 DIAGNOSIS — F411 Generalized anxiety disorder: Secondary | ICD-10-CM | POA: Diagnosis present

## 2013-08-17 DIAGNOSIS — J4489 Other specified chronic obstructive pulmonary disease: Secondary | ICD-10-CM | POA: Diagnosis present

## 2013-08-17 DIAGNOSIS — Z800 Family history of malignant neoplasm of digestive organs: Secondary | ICD-10-CM

## 2013-08-17 DIAGNOSIS — K7689 Other specified diseases of liver: Secondary | ICD-10-CM | POA: Diagnosis present

## 2013-08-17 DIAGNOSIS — E872 Acidosis, unspecified: Secondary | ICD-10-CM | POA: Diagnosis present

## 2013-08-17 DIAGNOSIS — Z79899 Other long term (current) drug therapy: Secondary | ICD-10-CM

## 2013-08-17 DIAGNOSIS — I509 Heart failure, unspecified: Secondary | ICD-10-CM | POA: Diagnosis present

## 2013-08-17 DIAGNOSIS — K5090 Crohn's disease, unspecified, without complications: Secondary | ICD-10-CM | POA: Diagnosis present

## 2013-08-17 DIAGNOSIS — K529 Noninfective gastroenteritis and colitis, unspecified: Secondary | ICD-10-CM | POA: Diagnosis present

## 2013-08-17 DIAGNOSIS — J449 Chronic obstructive pulmonary disease, unspecified: Secondary | ICD-10-CM | POA: Diagnosis present

## 2013-08-17 DIAGNOSIS — K5289 Other specified noninfective gastroenteritis and colitis: Secondary | ICD-10-CM

## 2013-08-17 DIAGNOSIS — J9610 Chronic respiratory failure, unspecified whether with hypoxia or hypercapnia: Secondary | ICD-10-CM | POA: Diagnosis present

## 2013-08-17 DIAGNOSIS — K56609 Unspecified intestinal obstruction, unspecified as to partial versus complete obstruction: Secondary | ICD-10-CM

## 2013-08-17 DIAGNOSIS — K5900 Constipation, unspecified: Secondary | ICD-10-CM | POA: Diagnosis present

## 2013-08-17 DIAGNOSIS — L03119 Cellulitis of unspecified part of limb: Secondary | ICD-10-CM

## 2013-08-17 DIAGNOSIS — L02419 Cutaneous abscess of limb, unspecified: Secondary | ICD-10-CM | POA: Diagnosis present

## 2013-08-17 DIAGNOSIS — Z85828 Personal history of other malignant neoplasm of skin: Secondary | ICD-10-CM

## 2013-08-17 DIAGNOSIS — D72819 Decreased white blood cell count, unspecified: Secondary | ICD-10-CM | POA: Diagnosis present

## 2013-08-17 DIAGNOSIS — K219 Gastro-esophageal reflux disease without esophagitis: Secondary | ICD-10-CM | POA: Diagnosis present

## 2013-08-17 DIAGNOSIS — I10 Essential (primary) hypertension: Secondary | ICD-10-CM | POA: Diagnosis present

## 2013-08-17 DIAGNOSIS — Z806 Family history of leukemia: Secondary | ICD-10-CM

## 2013-08-17 DIAGNOSIS — R7989 Other specified abnormal findings of blood chemistry: Secondary | ICD-10-CM | POA: Diagnosis present

## 2013-08-17 DIAGNOSIS — D638 Anemia in other chronic diseases classified elsewhere: Secondary | ICD-10-CM | POA: Diagnosis present

## 2013-08-17 DIAGNOSIS — M129 Arthropathy, unspecified: Secondary | ICD-10-CM | POA: Diagnosis present

## 2013-08-17 DIAGNOSIS — E119 Type 2 diabetes mellitus without complications: Secondary | ICD-10-CM | POA: Diagnosis present

## 2013-08-17 DIAGNOSIS — K7460 Unspecified cirrhosis of liver: Secondary | ICD-10-CM | POA: Diagnosis present

## 2013-08-17 DIAGNOSIS — Z7901 Long term (current) use of anticoagulants: Secondary | ICD-10-CM

## 2013-08-17 DIAGNOSIS — R109 Unspecified abdominal pain: Secondary | ICD-10-CM

## 2013-08-17 LAB — GLUCOSE, CAPILLARY: Glucose-Capillary: 94 mg/dL (ref 70–99)

## 2013-08-17 LAB — URINALYSIS, ROUTINE W REFLEX MICROSCOPIC
BILIRUBIN URINE: NEGATIVE
Glucose, UA: NEGATIVE mg/dL
Hgb urine dipstick: NEGATIVE
KETONES UR: NEGATIVE mg/dL
NITRITE: NEGATIVE
PH: 5.5 (ref 5.0–8.0)
PROTEIN: NEGATIVE mg/dL
Specific Gravity, Urine: <1.005 — ABNORMAL LOW (ref 1.005–1.030)
UROBILINOGEN UA: 0.2 mg/dL (ref 0.0–1.0)

## 2013-08-17 LAB — COMPREHENSIVE METABOLIC PANEL
ALT: 16 U/L (ref 0–35)
AST: 27 U/L (ref 0–37)
Albumin: 2.9 g/dL — ABNORMAL LOW (ref 3.5–5.2)
Alkaline Phosphatase: 90 U/L (ref 39–117)
BUN: 11 mg/dL (ref 6–23)
CO2: 26 meq/L (ref 19–32)
CREATININE: 1.09 mg/dL (ref 0.50–1.10)
Calcium: 9.5 mg/dL (ref 8.4–10.5)
Chloride: 91 mEq/L — ABNORMAL LOW (ref 96–112)
GFR, EST AFRICAN AMERICAN: 56 mL/min — AB (ref >90)
GFR, EST NON AFRICAN AMERICAN: 48 mL/min — AB (ref >90)
GLUCOSE: 124 mg/dL — AB (ref 70–99)
Potassium: 5.3 mEq/L (ref 3.7–5.3)
Sodium: 129 mEq/L — ABNORMAL LOW (ref 137–147)
TOTAL PROTEIN: 7.5 g/dL (ref 6.0–8.3)
Total Bilirubin: 1.4 mg/dL — ABNORMAL HIGH (ref 0.3–1.2)

## 2013-08-17 LAB — CBC WITH DIFFERENTIAL/PLATELET
Basophils Absolute: 0.1 10*3/uL (ref 0.0–0.1)
Basophils Relative: 1 % (ref 0–1)
EOS PCT: 1 % (ref 0–5)
Eosinophils Absolute: 0.1 10*3/uL (ref 0.0–0.7)
HCT: 33.1 % — ABNORMAL LOW (ref 36.0–46.0)
Hemoglobin: 10.3 g/dL — ABNORMAL LOW (ref 12.0–15.0)
LYMPHS ABS: 0.9 10*3/uL (ref 0.7–4.0)
LYMPHS PCT: 15 % (ref 12–46)
MCH: 25.2 pg — ABNORMAL LOW (ref 26.0–34.0)
MCHC: 31.1 g/dL (ref 30.0–36.0)
MCV: 81.1 fL (ref 78.0–100.0)
MONO ABS: 0.6 10*3/uL (ref 0.1–1.0)
Monocytes Relative: 10 % (ref 3–12)
Neutro Abs: 4.4 10*3/uL (ref 1.7–7.7)
Neutrophils Relative %: 73 % (ref 43–77)
Platelets: 235 10*3/uL (ref 150–400)
RBC: 4.08 MIL/uL (ref 3.87–5.11)
RDW: 17.9 % — ABNORMAL HIGH (ref 11.5–15.5)
WBC: 6.1 10*3/uL (ref 4.0–10.5)

## 2013-08-17 LAB — LACTIC ACID, PLASMA
LACTIC ACID, VENOUS: 5.1 mmol/L — AB (ref 0.5–2.2)
Lactic Acid, Venous: 3.7 mmol/L — ABNORMAL HIGH (ref 0.5–2.2)

## 2013-08-17 LAB — PROTIME-INR
INR: 2.77 — ABNORMAL HIGH (ref 0.00–1.49)
Prothrombin Time: 28.3 seconds — ABNORMAL HIGH (ref 11.6–15.2)

## 2013-08-17 LAB — URINE MICROSCOPIC-ADD ON

## 2013-08-17 LAB — CBG MONITORING, ED: GLUCOSE-CAPILLARY: 122 mg/dL — AB (ref 70–99)

## 2013-08-17 LAB — LIPASE, BLOOD: LIPASE: 74 U/L — AB (ref 11–59)

## 2013-08-17 MED ORDER — DILTIAZEM HCL ER COATED BEADS 180 MG PO CP24
180.0000 mg | ORAL_CAPSULE | Freq: Every day | ORAL | Status: DC
  Administered 2013-08-18 – 2013-08-19 (×2): 180 mg via ORAL
  Filled 2013-08-17 (×2): qty 1

## 2013-08-17 MED ORDER — IOHEXOL 300 MG/ML  SOLN
100.0000 mL | Freq: Once | INTRAMUSCULAR | Status: AC | PRN
  Administered 2013-08-17: 100 mL via INTRAVENOUS

## 2013-08-17 MED ORDER — METRONIDAZOLE IN NACL 5-0.79 MG/ML-% IV SOLN
500.0000 mg | Freq: Three times a day (TID) | INTRAVENOUS | Status: DC
  Administered 2013-08-17 – 2013-08-19 (×5): 500 mg via INTRAVENOUS
  Filled 2013-08-17 (×5): qty 100

## 2013-08-17 MED ORDER — MORPHINE SULFATE 2 MG/ML IJ SOLN: 1.0000 mg | INTRAMUSCULAR | Status: DC | PRN

## 2013-08-17 MED ORDER — CIPROFLOXACIN IN D5W 400 MG/200ML IV SOLN
400.0000 mg | Freq: Two times a day (BID) | INTRAVENOUS | Status: DC
  Administered 2013-08-17 – 2013-08-19 (×4): 400 mg via INTRAVENOUS
  Filled 2013-08-17 (×4): qty 200

## 2013-08-17 MED ORDER — PANTOPRAZOLE SODIUM 40 MG PO TBEC
40.0000 mg | DELAYED_RELEASE_TABLET | Freq: Every day | ORAL | Status: DC
  Administered 2013-08-18 – 2013-08-19 (×2): 40 mg via ORAL
  Filled 2013-08-17 (×2): qty 1

## 2013-08-17 MED ORDER — ONDANSETRON HCL 4 MG/2ML IJ SOLN
4.0000 mg | Freq: Four times a day (QID) | INTRAMUSCULAR | Status: DC | PRN
  Administered 2013-08-17: 4 mg via INTRAVENOUS
  Filled 2013-08-17: qty 2

## 2013-08-17 MED ORDER — WARFARIN SODIUM 1 MG PO TABS
0.5000 mg | ORAL_TABLET | Freq: Once | ORAL | Status: AC
  Administered 2013-08-17: 0.5 mg via ORAL
  Filled 2013-08-17: qty 1

## 2013-08-17 MED ORDER — ONDANSETRON HCL 4 MG PO TABS: 4.0000 mg | ORAL_TABLET | Freq: Four times a day (QID) | ORAL | Status: DC | PRN

## 2013-08-17 MED ORDER — VITAMIN C 500 MG PO TABS
500.0000 mg | ORAL_TABLET | Freq: Every day | ORAL | Status: DC
  Administered 2013-08-18 – 2013-08-19 (×2): 500 mg via ORAL
  Filled 2013-08-17 (×2): qty 1

## 2013-08-17 MED ORDER — FERROUS SULFATE 325 (65 FE) MG PO TABS
325.0000 mg | ORAL_TABLET | Freq: Every day | ORAL | Status: DC
  Administered 2013-08-18 – 2013-08-19 (×2): 325 mg via ORAL
  Filled 2013-08-17 (×2): qty 1

## 2013-08-17 MED ORDER — WARFARIN - PHARMACIST DOSING INPATIENT: Status: DC

## 2013-08-17 MED ORDER — ACETAMINOPHEN 325 MG PO TABS: 650.0000 mg | ORAL_TABLET | Freq: Four times a day (QID) | ORAL | Status: DC | PRN

## 2013-08-17 MED ORDER — IOHEXOL 300 MG/ML  SOLN
50.0000 mL | Freq: Once | INTRAMUSCULAR | Status: AC | PRN
  Administered 2013-08-17: 50 mL via ORAL

## 2013-08-17 MED ORDER — SODIUM CHLORIDE 0.9 % IV SOLN
Freq: Once | INTRAVENOUS | Status: AC
  Administered 2013-08-17: 1000 mL via INTRAVENOUS

## 2013-08-17 MED ORDER — GABAPENTIN 100 MG PO CAPS
100.0000 mg | ORAL_CAPSULE | Freq: Every day | ORAL | Status: DC
  Administered 2013-08-17 – 2013-08-18 (×2): 100 mg via ORAL
  Filled 2013-08-17 (×2): qty 1

## 2013-08-17 MED ORDER — PANTOPRAZOLE SODIUM 40 MG PO TBEC: 40.0000 mg | DELAYED_RELEASE_TABLET | Freq: Every day | ORAL | Status: DC

## 2013-08-17 MED ORDER — ALPRAZOLAM 0.25 MG PO TABS
0.2500 mg | ORAL_TABLET | Freq: Every day | ORAL | Status: DC
  Administered 2013-08-18 – 2013-08-19 (×2): 0.25 mg via ORAL
  Filled 2013-08-17 (×2): qty 1

## 2013-08-17 MED ORDER — ADULT MULTIVITAMIN W/MINERALS CH
1.0000 | ORAL_TABLET | Freq: Every day | ORAL | Status: DC
  Administered 2013-08-18 – 2013-08-19 (×2): 1 via ORAL
  Filled 2013-08-17 (×2): qty 1

## 2013-08-17 MED ORDER — MESALAMINE ER 250 MG PO CPCR
1000.0000 mg | ORAL_CAPSULE | Freq: Three times a day (TID) | ORAL | Status: DC
  Administered 2013-08-18 – 2013-08-19 (×5): 1000 mg via ORAL
  Filled 2013-08-17 (×12): qty 4

## 2013-08-17 MED ORDER — WARFARIN SODIUM 1 MG PO TABS
ORAL_TABLET | ORAL | Status: AC
  Filled 2013-08-17: qty 1

## 2013-08-17 MED ORDER — ONDANSETRON HCL 4 MG/2ML IJ SOLN
4.0000 mg | Freq: Once | INTRAMUSCULAR | Status: AC
  Administered 2013-08-17: 4 mg via INTRAVENOUS
  Filled 2013-08-17: qty 2

## 2013-08-17 MED ORDER — SODIUM CHLORIDE 0.9 % IV SOLN
INTRAVENOUS | Status: DC
  Administered 2013-08-17: 23:00:00 via INTRAVENOUS

## 2013-08-17 MED ORDER — INSULIN ASPART 100 UNIT/ML ~~LOC~~ SOLN
0.0000 [IU] | Freq: Three times a day (TID) | SUBCUTANEOUS | Status: DC
Start: 2013-08-18 — End: 2013-08-19
  Administered 2013-08-18: 2 [IU] via SUBCUTANEOUS
  Administered 2013-08-19: 5 [IU] via SUBCUTANEOUS
  Administered 2013-08-19: 2 [IU] via SUBCUTANEOUS

## 2013-08-17 MED ORDER — ACETAMINOPHEN 650 MG RE SUPP: 650.0000 mg | Freq: Four times a day (QID) | RECTAL | Status: DC | PRN

## 2013-08-17 MED ORDER — MESALAMINE ER 250 MG PO CPCR
ORAL_CAPSULE | ORAL | Status: AC
  Filled 2013-08-17: qty 4

## 2013-08-17 MED ORDER — PREDNISONE 20 MG PO TABS: 50.0000 mg | ORAL_TABLET | Freq: Every day | ORAL | Status: DC

## 2013-08-17 NOTE — ED Notes (Signed)
abd pain, vomiting for 3 weeks.Recent adm for 3 days last week.  No fever.  No diarrhea.today.

## 2013-08-17 NOTE — ED Provider Notes (Signed)
CSN: [id number]     Arrival date & time 08/17/13  1321 History   First MD Initiated Contact with Patient 08/17/13 1403     Chief Complaint  Patient presents with  . Abdominal Pain     (Consider location/radiation/quality/duration/timing/severity/associated sxs/prior Treatment) HPI Comments: Patient with history of Crohn's disease, small bowel obstruction in recent hospitalization for abdominal pain and extremity cellulitis presenting with worsening abdominal pain with nausea and vomiting. She describes the same pain as when she was admitted on March 10 this progressively worsening in her upper abdomen. She's not had a bowel movement in 2 days and is not passing gas. Denies any fever. No chest pain or shortness of breath. She still on antibiotics for her cellulitis.  Patient has a history of Crohn's disease with ileocolonic and anorectal strictures, status post surgery with ileocolonic anastomosis, recent small bowel obstruction at the level of ileocolonic  Anastomosis  The history is provided by the patient.    Past Medical History  Diagnosis Date  . Hypertension     x 5 years  . Paroxysmal atrial fibrillation   . Anxiety   . Crohn's disease   . 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 No clear documentation of this  . Diabetes mellitus   . Neuromuscular disorder     periph neuropathy  . Shortness of breath   . Arthritis   . GERD (gastroesophageal reflux disease)   . Blood transfusion   . COPD (chronic obstructive pulmonary disease)   . Cirrhosis     non aloholic   . Pleural effusion 02/13/2012  . RVH (right ventricular hypertrophy) 02/13/2012  . Anemia 02/13/2012  . Chronic respiratory failure 02/15/2012    With hypoxia and hypercapnia.  . Anasarca 01/2012    Ejection fraction 60-65%  . CHF (congestive heart failure)   . Small bowel obstruction    Past Surgical History  Procedure Laterality Date  . Basal cell carcinoma excision    . Abdominal surgery       multiple  . Vesicovaginal fistula closure w/ tah    . Cancers resected    . Appendectomy    . Abdominal hysterectomy    . Tubal ligation    . Cholecystectomy    . Colon surgery      hx bleeding ulcer  . Mass excision  06/23/2011    Procedure: EXCISION MASS;  Surgeon: Tennis Must, MD;  Location: [place];  Service: Orthopedics;  Laterality: Left;  left hand excision mass with acell placement  . Left hand  06/23/11    Removed Squamous cell , and also did graft  . Mass excision  11/02/2011    Procedure: EXCISION MASS;  Surgeon: Tennis Must, MD;  Location: [place];  Service: Orthopedics;  Laterality: Left;  EXCISION MASS LEFT HAND with full thickness skin graft from left upper arm  . Colonoscopy with esophagogastroduodenoscopy (egd) N/A 08/01/2012    Procedure: COLONOSCOPY WITH ESOPHAGOGASTRODUODENOSCOPY (EGD);  Surgeon: Rogene Houston, MD;  [facility];  Service: Endoscopy;  Laterality: N/A;  730  . Balloon dilation N/A 08/01/2012    Procedure: BALLOON DILATION;  Surgeon: Rogene Houston, MD;  [facility];  Service: Endoscopy;  Laterality: N/A;  Venia Minks dilation N/A 08/01/2012    Procedure: Venia Minks DILATION;  Surgeon: Rogene Houston, MD;  [facility];  Service: Endoscopy;  Laterality: N/A;  . Savory dilation N/A 08/01/2012  Procedure: SAVORY DILATION;  Surgeon: Rogene Houston, MD;  [facility];  Service: Endoscopy;  Laterality: N/A;   Family History  Problem Relation Age of Onset  . Coronary artery disease Father   . Heart attack Father   . Coronary artery disease Mother   . Coronary artery disease Brother   . Heart attack Brother   . Pancreatic cancer Brother   . Heart attack Brother   . Leukemia Daughter   . Healthy Daughter   . Colon cancer Neg Hx    History  Substance Use Topics  . Smoking status: Current Every Day Smoker -- 0.50 packs/day for 40 years    Types: Cigarettes    Start date:  06/02/1963  . Smokeless tobacco: Never Used  . Alcohol Use: No   OB History   Grav Para Term Preterm Abortions TAB SAB Ect Mult Living                 Review of Systems  Constitutional: Positive for activity change, appetite change and fatigue. Negative for fever.  Respiratory: Negative for cough, chest tightness and shortness of breath.   Cardiovascular: Negative for chest pain.  Gastrointestinal: Positive for nausea, vomiting, abdominal pain and constipation.  Genitourinary: Negative for dysuria, hematuria, vaginal bleeding and vaginal discharge.  Musculoskeletal: Negative for back pain.  Skin: Negative for pallor and rash.  Neurological: Negative for dizziness, weakness and headaches.  A complete 10 system review of systems was obtained and all systems are negative except as noted in the HPI and PMH.      Allergies  Sulfonamide derivatives  Home Medications   Current Outpatient Rx  Name  Route  Sig  Dispense  Refill  . acetaminophen (TYLENOL) 500 MG tablet   Oral   Take 500 mg by mouth every 8 (eight) hours as needed. Pain         . ALPRAZolam (XANAX) 0.5 MG tablet   Oral   Take 0.25-0.5 mg by mouth daily.         Marland Kitchen diltiazem (CARDIZEM CD) 180 MG 24 hr capsule   Oral   Take 180 mg by mouth daily.         Marland Kitchen doxycycline (VIBRA-TABS) 100 MG tablet   Oral   Take 1 tablet (100 mg total) by mouth 2 (two) times daily.   14 tablet   0     Cellulitis of leg   . ferrous sulfate 325 (65 FE) MG tablet   Oral   Take 325 mg by mouth. Patient takes this medication 3 times per week         . furosemide (LASIX) 20 MG tablet   Oral   Take 20 mg by mouth daily.         Marland Kitchen gabapentin (NEURONTIN) 100 MG capsule   Oral   Take 100 mg by mouth at bedtime.         Marland Kitchen HYDROcodone-acetaminophen (NORCO) 5-325 MG per tablet      1-2 tabs po q6 hours prn pain   40 tablet   0   . lansoprazole (PREVACID) 30 MG capsule   Oral   Take 30 mg by mouth daily as needed  (for acid reflux).          . mesalamine (PENTASA) 500 MG CR capsule   Oral   Take 2 capsules (1,000 mg total) by mouth 3 (three) times daily.   180 capsule   2   . metFORMIN (GLUCOPHAGE) 1000  MG tablet   Oral   Take 1,000 mg by mouth 2 (two) times daily with a meal.         . Multiple Vitamin (MULTIVITAMIN) tablet   Oral   Take 1 tablet by mouth daily.           . ondansetron (ZOFRAN) 4 MG tablet   Oral   Take 4 mg by mouth every 8 (eight) hours as needed for nausea or vomiting.         . potassium chloride (K-DUR) 10 MEQ tablet   Oral   Take 10 mEq by mouth every morning.         Marland Kitchen spironolactone (ALDACTONE) 50 MG tablet   Oral   Take 50 mg by mouth 2 (two) times daily.         . vitamin C (ASCORBIC ACID) 500 MG tablet   Oral   Take 500 mg by mouth daily.         Marland Kitchen warfarin (COUMADIN) 1 MG tablet   Oral   Take 0.5-1 mg by mouth daily. Patient takes 1mg  on Tues.and Thurs. And 0.5mg  all other days          BP 125/64  Pulse 67  Temp(Src) 97.9 F (36.6 C) (Oral)  Resp 20  Ht 4\' 11"  (1.499 m)  Wt 194 lb (87.998 kg)  BMI 39.16 kg/m2  SpO2 96% Physical Exam  Constitutional: She is oriented to person, place, and time. She appears well-developed and well-nourished. No distress.  HENT:  Head: Normocephalic and atraumatic.  Mouth/Throat: Oropharynx is clear and moist. No oropharyngeal exudate.  Eyes: Conjunctivae and EOM are normal. Pupils are equal, round, and reactive to light.  Neck: Normal range of motion. Neck supple.  Cardiovascular: Normal rate, regular rhythm and normal heart sounds.   No murmur heard. Pulmonary/Chest: Effort normal and breath sounds normal. No respiratory distress.  Abdominal: Soft. Bowel sounds are normal. There is tenderness. There is no rebound and no guarding.  Small periumbilical hernia, reducible.  Periumbilical and epigastric tenderness  Musculoskeletal: Normal range of motion. She exhibits edema.  Edematous lower  extremities with improving cellulitis per family  Neurological: She is alert and oriented to person, place, and time. No cranial nerve deficit. She exhibits normal muscle tone. Coordination normal.  Skin: Skin is warm.    ED Course  Procedures (including critical care time) Labs Review Labs Reviewed  CBC WITH DIFFERENTIAL - Abnormal; Notable for the following:    Hemoglobin 10.3 (*)    HCT 33.1 (*)    MCH 25.2 (*)    RDW 17.9 (*)    All other components within normal limits  COMPREHENSIVE METABOLIC PANEL - Abnormal; Notable for the following:    Sodium 129 (*)    Chloride 91 (*)    Glucose, Bld 124 (*)    Albumin 2.9 (*)    Total Bilirubin 1.4 (*)    GFR calc non Af Amer 48 (*)    GFR calc Af Amer 56 (*)    All other components within normal limits  LIPASE, BLOOD - Abnormal; Notable for the following:    Lipase 74 (*)    All other components within normal limits  LACTIC ACID, PLASMA - Abnormal; Notable for the following:    Lactic Acid, Venous 5.1 (*)    All other components within normal limits  PROTIME-INR - Abnormal; Notable for the following:    Prothrombin Time 28.3 (*)    INR 2.77 (*)  All other components within normal limits  CBG MONITORING, ED - Abnormal; Notable for the following:    Glucose-Capillary 122 (*)    All other components within normal limits  URINALYSIS, ROUTINE W REFLEX MICROSCOPIC   Imaging Review No results found.   EKG Interpretation None      MDM   Final diagnoses:  None  Worsening abdominal pain with vomiting. NO BM or flatus x 2days.  No fever.  Recent hospitalization for same.   Labs remarkable for lactate of 5.  Minimal tenderness to palpation of abdomen.  Vitals stable, patient does not appear toxic or septic.  IVF and symptom control given. Imaging will be obtained given her complex GI history. Exam is not consistent with ischemic colitis but that is possible.   Dr. Jeneen Rinks to follow up Xray and CT results, plan admission. BP  125/64  Pulse 67  Temp(Src) 97.9 F (36.6 C) (Oral)  Resp 20  Ht 4\' 11"  (1.499 m)  Wt 194 lb (87.998 kg)  BMI 39.16 kg/m2  SpO2 96%   Ezequiel Essex, MD 08/17/13 1710

## 2013-08-17 NOTE — H&P (Addendum)
[facility] History and Physical  [name] ZOX:[id number] DOB: 04-21-1936 DOA: 08/17/2013  Referring physician: Dr. Tanna Furry PCP: Redge Gainer, MD   Chief Complaint:  Abdominal pain for 4 days  HPI:  78 year old female with history of hypertension, paroxysmal A. fib on Coumadin, anxiety, Crohn's disease, diabetes mellitus, GERD, COPD, nonalcoholic fatty liver disease, anemia, history of CHF who was recently hospitalized for cellulitis of her left leg and discharged on a course of oral doxycycline. She had presented with abdominal pain with nausea and vomiting with nonspecific findings on CT scan of her abdomen. Her abdominal pain had resolved on the next day of admission and she was discharged home 6 days back.  Patient rep for 2 days upon discharge but again had worsening periumbilical pain radiating up to the epigastric area. She reports the pain to be similar to her previous presentation. Patient also reports severe nausea but no vomiting and poor appetite. Patient reports she had not had a bowel movement for last 3 days and had a normal BM after receiving contrast for CT abdomen in the ED. she denies any blood in stool. Patient denies headache, dizziness, fever, chills,  chest pain, palpitations, SOB, abdominal pain, bowel or urinary symptoms.   Course in the ED Patient's vitals were stable. She had mid abdominal tenderness on exam. It looks short of 10.3 which was at baseline. Chemistry showed hyponatremia with sodium of 129, potassium 5.3, chloride of 91, CO2 of 26, normal renal function. INR was 2.77 and glucose of 124. Her lactate acid was markedly elevated to 5.   CT scan of the abdomen and pelvis with contrast showed segmental wall thickening of the mid transverse colon concerning for colitis (Crohn's versus infection versus? Ischemia)  Review of Systems:  Constitutional: Denies fever, chills, diaphoresis,  poor appetite appetite change and fatigue.  HEENT: Denies  photophobia, eye pain, ear pain, congestion, sore throat, rhinorrhea, sneezing, mouth sores, trouble swallowing, neck pain,  Respiratory: Denies SOB, DOE, cough, chest tightness,  and wheezing.   Cardiovascular: Denies chest pain, palpitations and leg swelling.  Gastrointestinal:  nausea and abdominal pain Denies vomiting, diarrhea, constipation, blood in stool and abdominal distention.  Genitourinary: Denies dysuria, urgency, frequency, hematuria, flank pain and difficulty urinating.  Endocrine: Denies  polyuria, polydipsia. Musculoskeletal: Denies myalgias, back pain, joint swelling, arthralgias and gait problem.  Skin: Denies pallor, rash and wound.  Neurological: Denies dizziness, seizures, syncope, weakness, light-headedness, numbness and headaches.    Past Medical History  Diagnosis Date  . Hypertension     x 5 years  . Paroxysmal atrial fibrillation   . Anxiety   . Crohn's disease   . 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 No clear documentation of this  . Diabetes mellitus   . Neuromuscular disorder     periph neuropathy  . Shortness of breath   . Arthritis   . GERD (gastroesophageal reflux disease)   . Blood transfusion   . COPD (chronic obstructive pulmonary disease)   . Cirrhosis     non aloholic   . Pleural effusion 02/13/2012  . RVH (right ventricular hypertrophy) 02/13/2012  . Anemia 02/13/2012  . Chronic respiratory failure 02/15/2012    With hypoxia and hypercapnia.  . Anasarca 01/2012    Ejection fraction 60-65%  . CHF (congestive heart failure)   . Small bowel obstruction    Past Surgical History  Procedure Laterality Date  . Basal cell carcinoma excision    . Abdominal surgery  multiple  . Vesicovaginal fistula closure w/ tah    . Cancers resected    . Appendectomy    . Abdominal hysterectomy    . Tubal ligation    . Cholecystectomy    . Colon surgery      hx bleeding ulcer  . Mass excision  06/23/2011    Procedure: EXCISION  MASS;  Surgeon: Tennis Must, MD;  Location: [place];  Service: Orthopedics;  Laterality: Left;  left hand excision mass with acell placement  . Left hand  06/23/11    Removed Squamous cell , and also did graft  . Mass excision  11/02/2011    Procedure: EXCISION MASS;  Surgeon: Tennis Must, MD;  Location: [place];  Service: Orthopedics;  Laterality: Left;  EXCISION MASS LEFT HAND with full thickness skin graft from left upper arm  . Colonoscopy with esophagogastroduodenoscopy (egd) N/A 08/01/2012    Procedure: COLONOSCOPY WITH ESOPHAGOGASTRODUODENOSCOPY (EGD);  Surgeon: Rogene Houston, MD;  [facility];  Service: Endoscopy;  Laterality: N/A;  730  . Balloon dilation N/A 08/01/2012    Procedure: BALLOON DILATION;  Surgeon: Rogene Houston, MD;  [facility];  Service: Endoscopy;  Laterality: N/A;  Venia Minks dilation N/A 08/01/2012    Procedure: Venia Minks DILATION;  Surgeon: Rogene Houston, MD;  [facility];  Service: Endoscopy;  Laterality: N/A;  . Savory dilation N/A 08/01/2012    Procedure: SAVORY DILATION;  Surgeon: Rogene Houston, MD;  [facility];  Service: Endoscopy;  Laterality: N/A;   Social History:  reports that she has been smoking Cigarettes.  She started smoking about 50 years ago. She has a 20 pack-year smoking history. She has never used smokeless tobacco. She reports that she does not drink alcohol or use illicit drugs.  Allergies  Allergen Reactions  . Sulfonamide Derivatives Other (See Comments)    Felt funny    Family History  Problem Relation Age of Onset  . Coronary artery disease Father   . Heart attack Father   . Coronary artery disease Mother   . Coronary artery disease Brother   . Heart attack Brother   . Pancreatic cancer Brother   . Heart attack Brother   . Leukemia Daughter   . Healthy Daughter   . Colon cancer Neg Hx     Prior to Admission medications   Medication Sig Start  Date End Date Taking? Authorizing Provider  acetaminophen (TYLENOL) 500 MG tablet Take 500 mg by mouth every 8 (eight) hours as needed. Pain   Yes Historical Provider, MD  ALPRAZolam (XANAX) 0.5 MG tablet Take 0.25-0.5 mg by mouth daily.   Yes Historical Provider, MD  diltiazem (CARDIZEM CD) 180 MG 24 hr capsule Take 180 mg by mouth daily.   Yes Historical Provider, MD  doxycycline (VIBRA-TABS) 100 MG tablet Take 1 tablet (100 mg total) by mouth 2 (two) times daily. 08/11/13  Yes Kasson Lamere, MD  ferrous sulfate 325 (65 FE) MG tablet Take 325 mg by mouth. Patient takes this medication 3 times per week   Yes Historical Provider, MD  furosemide (LASIX) 20 MG tablet Take 20 mg by mouth daily.   Yes Historical Provider, MD  gabapentin (NEURONTIN) 100 MG capsule Take 100 mg by mouth at bedtime.   Yes Historical Provider, MD  HYDROcodone-acetaminophen [facility]) 5-325 MG per tablet 1-2 tabs po q6 hours prn pain 08/14/13  Yes Lysbeth Penner, FNP  lansoprazole (  PREVACID) 30 MG capsule Take 30 mg by mouth daily as needed (for acid reflux).    Yes Historical Provider, MD  mesalamine (PENTASA) 500 MG CR capsule Take 2 capsules (1,000 mg total) by mouth 3 (three) times daily. 03/28/13  Yes Rogene Houston, MD  metFORMIN (GLUCOPHAGE) 1000 MG tablet Take 1,000 mg by mouth 2 (two) times daily with a meal.   Yes Historical Provider, MD  Multiple Vitamin (MULTIVITAMIN) tablet Take 1 tablet by mouth daily.     Yes Historical Provider, MD  ondansetron (ZOFRAN) 4 MG tablet Take 4 mg by mouth every 8 (eight) hours as needed for nausea or vomiting.   Yes Historical Provider, MD  potassium chloride (K-DUR) 10 MEQ tablet Take 10 mEq by mouth every morning.   Yes Historical Provider, MD  spironolactone (ALDACTONE) 50 MG tablet Take 50 mg by mouth 2 (two) times daily.   Yes Historical Provider, MD  vitamin C (ASCORBIC ACID) 500 MG tablet Take 500 mg by mouth daily.   Yes Historical Provider, MD  warfarin (COUMADIN) 1 MG  tablet Take 0.5-1 mg by mouth daily. Patient takes 1mg  on Tues.and Thurs. And 0.5mg  all other days   Yes Historical Provider, MD     Physical Exam:  Filed Vitals:   08/17/13 1332 08/17/13 1623 08/17/13 1720 08/17/13 1800  BP: 116/53 125/64 101/48 127/58  Pulse: 65 67 58 70  Temp: 97.9 F (36.6 C)  98.6 F (37 C)   TempSrc: Oral  Oral   Resp: 20 20 19 18   Height: 4\' 11"  (1.499 m)     Weight: 87.998 kg (194 lb)     SpO2: 98% 96% 97% 95%    Constitutional: Vital signs reviewed.   elderly female  in no acute distress. HEENT: no pallor, no icterus, dry oral mucosa,  Cardiovascular: RRR, S1 normal, S2 normal, no MRG Chest: CTAB, no wheezes, rales, or rhonchi Abdominal: Soft. mild mid abdominal tenderness,  bowel sounds are normal, ventral hernia on exam, no masses, organomegaly, or guarding present.  Ext: warm, no edema, left leg cellulitis improved  Neurological: A&O x3, non focal  Labs on Admission:  Basic Metabolic Panel:  Recent Labs Lab 08/17/13 1412  NA 129*  K 5.3  CL 91*  CO2 26  GLUCOSE 124*  BUN 11  CREATININE 1.09  CALCIUM 9.5   Liver Function Tests:  Recent Labs Lab 08/17/13 1412  AST 27  ALT 16  ALKPHOS 90  BILITOT 1.4*  PROT 7.5  ALBUMIN 2.9*    Recent Labs Lab 08/17/13 1412  LIPASE 74*   No results found for this basename: AMMONIA,  in the last 168 hours CBC:  Recent Labs Lab 08/14/13 1055 08/17/13 1412  WBC 6.9 6.1  NEUTROABS  --  4.4  HGB 9.9* 10.3*  HCT 32.3* 33.1*  MCV 78.6* 81.1  PLT  --  235   Cardiac Enzymes: No results found for this basename: CKTOTAL, CKMB, CKMBINDEX, TROPONINI,  in the last 168 hours BNP: No components found with this basename: POCBNP,  CBG:  Recent Labs Lab 08/10/13 2147 08/11/13 0730 08/17/13 1337  GLUCAP 114* 129* 122*    Radiological Exams on Admission: Ct Abdomen Pelvis W Contrast  08/17/2013   CLINICAL DATA:  Vomiting for 3 weeks, abdominal pain, history Crohn's disease, diabetes,  hypertension, CHF, atrial fibrillation, COPD, cirrhosis  EXAM: CT ABDOMEN AND PELVIS WITH CONTRAST  TECHNIQUE: Multidetector CT imaging of the abdomen and pelvis was performed using the standard protocol following  bolus administration of intravenous contrast. Sagittal and coronal MPR images reconstructed from axial data set.  CONTRAST:  131mL OMNIPAQUE IOHEXOL 300 MG/ML SOLN. Dilute oral contrast.  COMPARISON:  08/08/2013  FINDINGS: Small bibasilar pleural effusions greater on right with adjacent compressive atelectasis of the lower lobes.  Paraesophageal varices.  Cirrhotic appearing liver with ascites.  Perisplenic collaterals noted secondary to spontaneous splenorenal shunt.  Additional collaterals in anterior wall and periumbilical region (caput medusa).  Single stable calcification at pancreatic head.  No additional focal abnormalities of the liver, spleen, pancreas, kidneys, or adrenal glands.  Scattered surgical clips in upper abdomen post ileocolic resection.  Bowel wall thickening of mid transverse colon question related to Crohn's disease, similar distribution to prior study.  Scattered normal sized to minimally enlarged mediastinal and retroperitoneal lymph nodes.  Stomach and small bowel loops normal appearance.  Left ovarian cyst 4.3 x 4.0 cm.  No additional mass, adenopathy, free air or hernia.  No acute osseous findings.  Scattered soft tissue edema/anasarca.  IMPRESSION: Post ileocolic resection with segmental wall thickening of the mid transverse colon, question related to Crohn's disease or other colitis such as infection or ischemia ; tumor consider less likely due to the length of involvement but not entirely excluded.  Consider follow-up colonoscopy if this fails to resolve.  Nonspecific adenopathy.  Cirrhotic liver with upper abdominal collaterals, ascites, and a spontaneous splenorenal shunt.  Left ovarian cyst mildly increased in size since previous exam.   Electronically Signed   By: Lavonia Dana M.D.   On: 08/17/2013 17:04      Assessment/Plan Principal Problem:   Acute colitis Admit to MedSurg IV hydration with normal saline Pain control with when necessary morphine, supportive care with IV hydration and antiemetics -Serial abdominal exam. Monitor lactate. -will place on empiric cipro and flagyl. Monitor H&H. Will add empiric steroid for possible Crohn's colitis. GI consult in the morning   Active Problems: Crohn's disease Continue mesalamine. GI consult in a.m.   Hyponatremia Likely prerenal. Monitor with IV fluids. We'll discontinue Lasix  Paroxysmal A. fib Heart are stable. On chronic Coumadin with therapeutic INR. Resume Cardizem  NAFLD LFTs are stable    Diabetes mellitus Hold metformin. monitor fsg. continue SSI  Cellulitis of left leg On oral doxycycline on 10 3/21  Anemia, eye and deficiency Continue iron sulfate  Hyperkalemia Hold Aldactone and oral potassium  Diet: Clear liquids  DVT prophylaxis: On Coumadin   Code Status: full code Family Communication:  None at bedside Disposition Plan:  home once stable   Baley Lorimer, [place] [facility] Pager [phone]  Total time spent on admission :70 minutes  If 7PM-7AM, please contact night-coverage [email or web address] Password [email or web address] 08/17/2013, 8:22 PM

## 2013-08-17 NOTE — Telephone Encounter (Signed)
Metoprolol not on med list- who rx

## 2013-08-17 NOTE — Telephone Encounter (Signed)
Do not see on current med list. Please advise 

## 2013-08-17 NOTE — Progress Notes (Signed)
ANTICOAGULATION CONSULT NOTE - Initial Consult  Pharmacy Consult for Coumadin Indication: atrial fibrillation  Allergies  Allergen Reactions  . Sulfonamide Derivatives Other (See Comments)    Felt funny    Patient Measurements: Height: 4\' 11"  (149.9 cm) Weight: 194 lb (87.998 kg) IBW/kg (Calculated) : 43.2  Vital Signs: Temp: 98.6 F (37 C) (03/19 1720) Temp src: Oral (03/19 1720) BP: 127/58 mmHg (03/19 1800) Pulse Rate: 70 (03/19 1800)  Labs:  Recent Labs  08/17/13 1412  HGB 10.3*  HCT 33.1*  PLT 235  LABPROT 28.3*  INR 2.77*  CREATININE 1.09    Estimated Creatinine Clearance: 42.4 ml/min (by C-G formula based on Cr of 1.09).   Medical History: Past Medical History  Diagnosis Date  . Hypertension     x 5 years  . Paroxysmal atrial fibrillation   . Anxiety   . Crohn's disease   . 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 No clear documentation of this  . Diabetes mellitus   . Neuromuscular disorder     periph neuropathy  . Shortness of breath   . Arthritis   . GERD (gastroesophageal reflux disease)   . Blood transfusion   . COPD (chronic obstructive pulmonary disease)   . Cirrhosis     non aloholic   . Pleural effusion 02/13/2012  . RVH (right ventricular hypertrophy) 02/13/2012  . Anemia 02/13/2012  . Chronic respiratory failure 02/15/2012    With hypoxia and hypercapnia.  . Anasarca 01/2012    Ejection fraction 60-65%  . CHF (congestive heart failure)   . Small bowel obstruction     Medications:   (Not in a hospital admission)  Assessment: 78 yo F on chronic Coumadin for Afib.  He takes 1mg   On Tues & Thurs and 0.5mg  on Mon, Wed, Fri,Sat, & Sun.   INR is therapeutic on admission. No bleeding noted.    Goal of Therapy:  INR 2-3   Plan:  Coumadin 0.5mg  po x1 today Daily INR  Biagio Borg 08/17/2013,8:19 PM

## 2013-08-17 NOTE — ED Provider Notes (Signed)
Care assumed for Dr. Wyvonnia Dusky. I reviewed the results of the CT and the patient's findings. She has been started on IV  fluids here for the elevated Lactate. I discussed the case with the hospitalist, Dr. Lamona Curl.  Pt to be admitted.  Tanna Furry, MD 08/17/13 [phone]

## 2013-08-17 NOTE — ED Notes (Signed)
Patient assisted to bedside commode.  Advised the patient not to put toilet paper in bedside commode but patient failed to follow instructions.

## 2013-08-17 NOTE — ED Notes (Signed)
Pt had large loose BM. No obvious blood present.  Pt also urinated in commode but was unable to collect urine specimen.

## 2013-08-18 ENCOUNTER — Encounter (HOSPITAL_COMMUNITY): Payer: Self-pay

## 2013-08-18 DIAGNOSIS — E872 Acidosis, unspecified: Secondary | ICD-10-CM

## 2013-08-18 DIAGNOSIS — K5090 Crohn's disease, unspecified, without complications: Secondary | ICD-10-CM

## 2013-08-18 DIAGNOSIS — R112 Nausea with vomiting, unspecified: Secondary | ICD-10-CM

## 2013-08-18 DIAGNOSIS — R109 Unspecified abdominal pain: Secondary | ICD-10-CM

## 2013-08-18 DIAGNOSIS — Z7901 Long term (current) use of anticoagulants: Secondary | ICD-10-CM

## 2013-08-18 LAB — GLUCOSE, CAPILLARY
GLUCOSE-CAPILLARY: 68 mg/dL — AB (ref 70–99)
GLUCOSE-CAPILLARY: 86 mg/dL (ref 70–99)
GLUCOSE-CAPILLARY: 175 mg/dL — AB (ref 70–99)
Glucose-Capillary: 105 mg/dL — ABNORMAL HIGH (ref 70–99)
Glucose-Capillary: 123 mg/dL — ABNORMAL HIGH (ref 70–99)

## 2013-08-18 LAB — CLOSTRIDIUM DIFFICILE BY PCR: Toxigenic C. Difficile by PCR: NEGATIVE

## 2013-08-18 LAB — BASIC METABOLIC PANEL
BUN: 9 mg/dL (ref 6–23)
CHLORIDE: 96 meq/L (ref 96–112)
CO2: 30 meq/L (ref 19–32)
CREATININE: 1.02 mg/dL (ref 0.50–1.10)
Calcium: 8.8 mg/dL (ref 8.4–10.5)
GFR calc Af Amer: 60 mL/min — ABNORMAL LOW (ref >90)
GFR calc non Af Amer: 52 mL/min — ABNORMAL LOW (ref >90)
Glucose, Bld: 69 mg/dL — ABNORMAL LOW (ref 70–99)
POTASSIUM: 4.6 meq/L (ref 3.7–5.3)
Sodium: 131 mEq/L — ABNORMAL LOW (ref 137–147)

## 2013-08-18 LAB — LACTIC ACID, PLASMA: Lactic Acid, Venous: 1.3 mmol/L (ref 0.5–2.2)

## 2013-08-18 LAB — CBC
HCT: 26.4 % — ABNORMAL LOW (ref 36.0–46.0)
HEMOGLOBIN: 8.6 g/dL — AB (ref 12.0–15.0)
MCH: 26.1 pg (ref 26.0–34.0)
MCHC: 32.6 g/dL (ref 30.0–36.0)
MCV: 80 fL (ref 78.0–100.0)
Platelets: 173 10*3/uL (ref 150–400)
RBC: 3.3 MIL/uL — ABNORMAL LOW (ref 3.87–5.11)
RDW: 17.8 % — AB (ref 11.5–15.5)
WBC: 3.3 10*3/uL — ABNORMAL LOW (ref 4.0–10.5)

## 2013-08-18 LAB — C-REACTIVE PROTEIN: CRP: <0.5 mg/dL — ABNORMAL LOW (ref <0.60)

## 2013-08-18 LAB — PROTIME-INR
INR: 3.23 — AB (ref 0.00–1.49)
Prothrombin Time: 31.8 seconds — ABNORMAL HIGH (ref 11.6–15.2)

## 2013-08-18 LAB — OCCULT BLOOD X 1 CARD TO LAB, STOOL: FECAL OCCULT BLD: NEGATIVE

## 2013-08-18 MED ORDER — FUROSEMIDE 20 MG PO TABS
20.0000 mg | ORAL_TABLET | Freq: Every day | ORAL | Status: DC
  Administered 2013-08-18 – 2013-08-19 (×2): 20 mg via ORAL
  Filled 2013-08-18 (×2): qty 1

## 2013-08-18 MED ORDER — DEXTROSE 50 % IV SOLN
INTRAVENOUS | Status: AC
  Administered 2013-08-18: 25 mL
  Filled 2013-08-18: qty 50

## 2013-08-18 MED ORDER — DEXTROSE 50 % IV SOLN
25.0000 mL | Freq: Once | INTRAVENOUS | Status: AC | PRN
  Administered 2013-08-18: 25 mL via INTRAVENOUS

## 2013-08-18 MED ORDER — METHYLPREDNISOLONE SODIUM SUCC 40 MG IJ SOLR
30.0000 mg | Freq: Two times a day (BID) | INTRAMUSCULAR | Status: DC
  Administered 2013-08-18 – 2013-08-19 (×3): 30 mg via INTRAVENOUS
  Filled 2013-08-18 (×3): qty 1

## 2013-08-18 NOTE — Care Management Note (Signed)
    Page 1 of 1   08/18/2013     1:21:51 PM   CARE MANAGEMENT NOTE 08/18/2013  Patient:  [name], [name]   Account Number:  [id number]  Date Initiated:  08/18/2013  Documentation initiated by:  Claretha Cooper  Subjective/Objective Assessment:   Pt lives at home alone. Daughter lives nearby. Set up with [place] RN from recent discharge.     Action/Plan:   Anticipated DC Date:  08/19/2013   Anticipated DC Plan:  [place]  CM consult      [facility] Choice  HOME HEALTH  Resumption Of Svcs/PTA Provider   Choice offered to / List presented to:  C-1 Patient        [place] arranged  HH-1 RN      [place].   Status of service:  Completed, signed off Medicare Important Message given?   (If response is "NO", the following Medicare IM given date fields will be blank) Date Medicare IM given:   Date Additional Medicare IM given:    Discharge Disposition:    Per UR Regulation:    If discussed at Long Length of Stay Meetings, dates discussed:    Comments:  08/18/13 Claretha Cooper RN BSN CM

## 2013-08-18 NOTE — Progress Notes (Signed)
PROGRESS NOTE  [name] LKG:[id number] DOB: 1936-03-14 DOA: 08/17/2013 PCP: Redge Gainer, MD  Summary: 78 year old woman with history of Crohn's disease recently admitted for abdominal pain with nonspecific CT of the abdomen and pelvis, nausea and vomiting which resolved the next day and cellulitis of the left leg, discharged on doxycycline who presented to the emergency department with worsening periumbilical pain and epigastric pain, nausea but no vomiting and no bowel movement for 3 days. Admitted for acute colitis, GI consultation.  Assessment/Plan: 1. Abdominal pain, nausea, vomiting. Resolved. Possibly secondary to acute colitis although rapid resolution somewhat surprising. Multiple bowel movements after contrast given. 2. Lactic acidosis. Spontaneously resolved. Likely secondary to dehydration. 3. Hyponatremia. Improved with IV fluids. 4. Crohn's disease with history of ileocolonic and anorectal strictures, status post surgery with ileocolonic anastomosis. 5. Anemia of chronic disease. Appears close to baseline. 6. Recent diagnosis of cellulitis left lower extremity continue doxycycline. Appears to be resolved. 7. Diabetes mellitus. Mild hypoglycemia this morning. Advance diet. Monitor. Hold metformin 8. History of paroxysmal atrial fibrillation on warfarin. INR elevated. Hold warfarin. 9. Non-alcohol fatty liver disease, history of cirrhosis. Stable.   Acute issues appear to be resolved at this point. Advance diet. Continue to monitor. Continue empiric antibiotics. Follow-up GI recommendations.  Hold warfarin  Potentially home 3/21 continues to improve.  Code Status: full code DVT prophylaxis: warfarin Family Communication: none present Disposition Plan: home when improved  Murray Hodgkins, MD  [facility]  Pager [phone] If 7PM-7AM, please contact night-coverage at [email or web address], password [facility] 08/18/2013, 9:34 AM  LOS: 1 day    Consultants:  GI  Procedures:    Antibiotics:  Cipro 3/19 >>   Flagyl 3/19 >>   HPI/Subjective: Had several bowel movements after contrast with CT yesterday. Feeling better. No vomiting. Wants to advance diet. No abdominal pain. Left lower extremity is improving. Chronic lower extremity edema noted.   Objective: Filed Vitals:   08/17/13 1720 08/17/13 1800 08/17/13 2206 08/18/13 0454  BP: 101/48 127/58 129/51 93/65  Pulse: 58 70 69 84  Temp: 98.6 F (37 C)  97.5 F (36.4 C) 97.8 F (36.6 C)  TempSrc: Oral  Oral Oral  Resp: 19 18 20 20   Height:   4\' 11"  (1.499 m)   Weight:   88.4 kg (194 lb 14.2 oz)   SpO2: 97% 95% 97% 94%    Intake/Output Summary (Last 24 hours) at 08/18/13 0934 Last data filed at 08/18/13 0453  Gross per 24 hour  Intake      0 ml  Output    900 ml  Net   -900 ml     Filed Weights   08/17/13 1332 08/17/13 2206  Weight: 87.998 kg (194 lb) 88.4 kg (194 lb 14.2 oz)    Exam:   Afebrile, vital signs are stable. No hypoxia.  Gen. Appears calm and comfortable. Speech fluent and clear. Nontoxic.  Cardiovascular regular rate and rhythm. No murmur, rub or gallop. 2+ bilateral lower extremity edema.  Respiratory clear to auscultation bilaterally. No wheezes, rales or rhonchi. Normal respiratory effort.  Abdomen soft nontender and nondistended. Soft periumbilical hernia easily reducible. Nontender.  Psychiatric grossly normal mood and affect. Speech fluent and appropriate.  Skin chronic venous stasis changes bilateral lower extremities. No evidence of cellulitis.  Data Reviewed:  One episode of mild hypoglycemia this morning.  Kidney function appears normal. Sodium has improved. Lactic acid has normalized.  Hemoglobin 8.6. Blood cell count 3.3. Platelet count 73.   INR increased, 3.23.  CT of the abdomen and pelvis suggested the possibility of colitis although changes may be related to Crohn's disease.   Scheduled Meds: . ALPRAZolam   0.25-0.5 mg Oral Daily  . ciprofloxacin  400 mg Intravenous Q12H  . diltiazem  180 mg Oral Daily  . ferrous sulfate  325 mg Oral Q breakfast  . gabapentin  100 mg Oral QHS  . insulin aspart  0-15 Units Subcutaneous TID WC  . mesalamine  1,000 mg Oral TID  . metronidazole  500 mg Intravenous Q8H  . multivitamin with minerals  1 tablet Oral Daily  . pantoprazole  40 mg Oral Daily  . predniSONE  50 mg Oral Q breakfast  . vitamin C  500 mg Oral Daily  . Warfarin - Pharmacist Dosing Inpatient   Does not apply Q24H   Continuous Infusions: . sodium chloride 100 mL/hr at 08/17/13 2328    Principal Problem:   Acute colitis Active Problems:   DM   ANXIETY   HYPERTENSION, UNSPECIFIED   Atrial fibrillation   CROHN'S DISEASE   Abdominal pain   Hyponatremia   Elevated lactic acid level   Hyperkalemia   Time spent 25  minutes

## 2013-08-18 NOTE — Consult Note (Signed)
Referring Provider: No ref. provider found Primary Care Physician:  Redge Gainer, MD Primary Gastroenterologist:  Dr. Laural Golden  Reason for Consultation:  Nausea vomiting and abdominal pain in a patient with known Crohn's disease.  HPI:   Patient is 78 year old Caucasian female with history of stenosing Crohn's disease who presented to the emergency room yesterday with a three-day history of nausea vomiting and constipation. Patient was felt to have a relapse of her Crohn's disease. She was begun on IV fluids and IV Cipro and metronidazole and again to feel better after she started to have bowel movements. She was able to eat full liquids at lunch without difficulty. Patient denies hematemesis melena or rectal bleeding. Most of her pain was across upper abdomen centered in left upper quadrant. This afternoon she has mild pain. She did not experience fever or chills. She is finishing doxycycline which was begun last week or cellulitis. During that admission she was complaining of abdominal pain but abdominal pelvic CT did not show any new findings. She has lost 7 pounds in one week. She does not take NSAIDs. Patient was also hospitalized in October 2014 for small bowel obstruction secondary to relapse of Crohn's disease and responded to medical therapy. During that admission she had dilation of small bowel proximal to ileocolonic anastomosis. Patient's last colonoscopy with stricture dilation was in March 2014.   Past Medical History  Diagnosis Date  . Hypertension     x 5 years  . Paroxysmal atrial fibrillation   . Anxiety   . Crohn's disease   . 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 No clear documentation of this  . Diabetes mellitus   . Neuromuscular disorder     periph neuropathy  . Shortness of breath   . Arthritis   . GERD (gastroesophageal reflux disease)   . Blood transfusion   . COPD (chronic obstructive pulmonary disease)   . Cirrhosis     non aloholic   .  Pleural effusion 02/13/2012  . RVH (right ventricular hypertrophy) 02/13/2012  . Anemia 02/13/2012  . Chronic respiratory failure 02/15/2012    With hypoxia and hypercapnia.  . Anasarca 01/2012    Ejection fraction 60-65%  . CHF (congestive heart failure)   . Small bowel obstruction     Past Surgical History  Procedure Laterality Date  . Basal cell carcinoma excision    . Abdominal surgery      multiple  . Vesicovaginal fistula closure w/ tah    . Cancers resected    . Appendectomy    . Abdominal hysterectomy    . Tubal ligation    . Cholecystectomy    . Colon surgery      hx bleeding ulcer  . Mass excision  06/23/2011    Procedure: EXCISION MASS;  Surgeon: Tennis Must, MD;  Location: [facility];  Service: Orthopedics;  Laterality: Left;  left hand excision mass with acell placement  . Left hand  06/23/11    Removed Squamous cell , and also did graft  . Mass excision  11/02/2011    Procedure: EXCISION MASS;  Surgeon: Tennis Must, MD;  Location: [facility];  Service: Orthopedics;  Laterality: Left;  EXCISION MASS LEFT HAND with full thickness skin graft from left upper arm  . Colonoscopy with esophagogastroduodenoscopy (egd) N/A 08/01/2012    Procedure: COLONOSCOPY WITH ESOPHAGOGASTRODUODENOSCOPY (EGD);  Surgeon: Rogene Houston, MD;  [facility];  Service: Endoscopy;  Laterality: N/A;  730  . Balloon dilation N/A 08/01/2012    Procedure: BALLOON DILATION;  Surgeon: Rogene Houston, MD;  [facility];  Service: Endoscopy;  Laterality: N/A;  Venia Minks dilation N/A 08/01/2012    Procedure: Venia Minks DILATION;  Surgeon: Rogene Houston, MD;  [facility];  Service: Endoscopy;  Laterality: N/A;  . Savory dilation N/A 08/01/2012    Procedure: SAVORY DILATION;  Surgeon: Rogene Houston, MD;  [facility];  Service: Endoscopy;  Laterality: N/A;    Prior to Admission medications   Medication Sig Start Date End Date Taking?  Authorizing Provider  acetaminophen (TYLENOL) 500 MG tablet Take 500 mg by mouth every 8 (eight) hours as needed. Pain   Yes Historical Provider, MD  ALPRAZolam (XANAX) 0.5 MG tablet Take 0.25-0.5 mg by mouth daily.   Yes Historical Provider, MD  diltiazem (CARDIZEM CD) 180 MG 24 hr capsule Take 180 mg by mouth daily.   Yes Historical Provider, MD  doxycycline (VIBRA-TABS) 100 MG tablet Take 1 tablet (100 mg total) by mouth 2 (two) times daily. 08/11/13  Yes Nishant Dhungel, MD  ferrous sulfate 325 (65 FE) MG tablet Take 325 mg by mouth. Patient takes this medication 3 times per week   Yes Historical Provider, MD  furosemide (LASIX) 20 MG tablet Take 20 mg by mouth daily.   Yes Historical Provider, MD  gabapentin (NEURONTIN) 100 MG capsule Take 100 mg by mouth at bedtime.   Yes Historical Provider, MD  HYDROcodone-acetaminophen [facility]) 5-325 MG per tablet 1-2 tabs po q6 hours prn pain 08/14/13  Yes Lysbeth Penner, FNP  lansoprazole (PREVACID) 30 MG capsule Take 30 mg by mouth daily as needed (for acid reflux).    Yes Historical Provider, MD  mesalamine (PENTASA) 500 MG CR capsule Take 2 capsules (1,000 mg total) by mouth 3 (three) times daily. 03/28/13  Yes Rogene Houston, MD  metFORMIN (GLUCOPHAGE) 1000 MG tablet Take 1,000 mg by mouth 2 (two) times daily with a meal.   Yes Historical Provider, MD  Multiple Vitamin (MULTIVITAMIN) tablet Take 1 tablet by mouth daily.     Yes Historical Provider, MD  ondansetron (ZOFRAN) 4 MG tablet Take 4 mg by mouth every 8 (eight) hours as needed for nausea or vomiting.   Yes Historical Provider, MD  potassium chloride (K-DUR) 10 MEQ tablet Take 10 mEq by mouth every morning.   Yes Historical Provider, MD  spironolactone (ALDACTONE) 50 MG tablet Take 50 mg by mouth 2 (two) times daily.   Yes Historical Provider, MD  vitamin C (ASCORBIC ACID) 500 MG tablet Take 500 mg by mouth daily.   Yes Historical Provider, MD  warfarin (COUMADIN) 1 MG tablet Take 0.5-1 mg by  mouth daily. Patient takes 1mg  on Tues.and Thurs. And 0.5mg  all other days   Yes Historical Provider, MD    Current Facility-Administered Medications  Medication Dose Route Frequency Provider Last Rate Last Dose  . acetaminophen (TYLENOL) tablet 650 mg  650 mg Oral Q6H PRN Nishant Dhungel, MD       Or  . acetaminophen (TYLENOL) suppository 650 mg  650 mg Rectal Q6H PRN Nishant Dhungel, MD      . ALPRAZolam Duanne Moron) tablet 0.25-0.5 mg  0.25-0.5 mg Oral Daily Nishant Dhungel, MD   0.25 mg at 08/18/13 1143  . ciprofloxacin (CIPRO) IVPB 400 mg  400 mg Intravenous Q12H Nishant Dhungel, MD   400 mg at 08/17/13 2328  . diltiazem (CARDIZEM CD) 24 hr  capsule 180 mg  180 mg Oral Daily Nishant Dhungel, MD   180 mg at 08/18/13 1140  . ferrous sulfate tablet 325 mg  325 mg Oral Q breakfast Nishant Dhungel, MD   325 mg at 08/18/13 1143  . furosemide (LASIX) tablet 20 mg  20 mg Oral Daily Samuella Cota, MD   20 mg at 08/18/13 1140  . gabapentin (NEURONTIN) capsule 100 mg  100 mg Oral QHS Nishant Dhungel, MD   100 mg at 08/17/13 2328  . insulin aspart (novoLOG) injection 0-15 Units  0-15 Units Subcutaneous TID WC Nishant Dhungel, MD      . mesalamine (PENTASA) CR capsule 1,000 mg  1,000 mg Oral TID Nishant Dhungel, MD      . methylPREDNISolone sodium succinate (SOLU-MEDROL) 40 mg/mL injection 30 mg  30 mg Intravenous Q12H Hanah Moultry U Suetta Hoffmeister, MD      . metroNIDAZOLE (FLAGYL) IVPB 500 mg  500 mg Intravenous Q8H Nishant Dhungel, MD   500 mg at 08/18/13 1145  . multivitamin with minerals tablet 1 tablet  1 tablet Oral Daily Nishant Dhungel, MD   1 tablet at 08/18/13 1143  . ondansetron (ZOFRAN) tablet 4 mg  4 mg Oral Q6H PRN Nishant Dhungel, MD       Or  . ondansetron (ZOFRAN) injection 4 mg  4 mg Intravenous Q6H PRN Nishant Dhungel, MD   4 mg at 08/17/13 2330  . pantoprazole (PROTONIX) EC tablet 40 mg  40 mg Oral Daily Nishant Dhungel, MD   40 mg at 08/18/13 1142  . vitamin C (ASCORBIC ACID) tablet 500 mg  500  mg Oral Daily Nishant Dhungel, MD   500 mg at 08/18/13 1139  . Warfarin - Pharmacist Dosing Inpatient   Does not apply Q24H Lavonia Drafts [place], [facility]        Allergies as of 08/17/2013 - Review Complete 08/17/2013  Allergen Reaction Noted  . Sulfonamide derivatives Other (See Comments)     Family History  Problem Relation Age of Onset  . Coronary artery disease Father   . Heart attack Father   . Coronary artery disease Mother   . Coronary artery disease Brother   . Heart attack Brother   . Pancreatic cancer Brother   . Heart attack Brother   . Leukemia Daughter   . Healthy Daughter   . Colon cancer Neg Hx     History   Social History  . Marital Status: Widowed    Spouse Name: N/A    Number of Children: N/A  . Years of Education: N/A   Occupational History  . Not on file.   Social History Main Topics  . Smoking status: Current Every Day Smoker -- 0.50 packs/day for 40 years    Types: Cigarettes    Start date: 06/02/1963  . Smokeless tobacco: Never Used  . Alcohol Use: No  . Drug Use: No  . Sexual Activity: No   Other Topics Concern  . Not on file   Social History Narrative  . No narrative on file    Review of Systems: See HPI, otherwise normal ROS  Physical Exam: Temp:  [97.5 F (36.4 C)-98.6 F (37 C)] 97.8 F (36.6 C) (03/20 0454) Pulse Rate:  [58-84] 80 (03/20 1139) Resp:  [18-20] 20 (03/20 1139) BP: (93-129)/(48-65) 105/60 mmHg (03/20 1139) SpO2:  [92 %-98 %] 92 % (03/20 1139) Weight:  [194 lb (87.998 kg)-194 lb 14.2 oz (88.4 kg)] 194 lb 14.2 oz (88.4 kg) (03/19 2206) Last BM Date:  08/17/13 Pleasant well-developed well-nourished Caucasian female who is in NAD. Patient is alert and in no acute distress. Conjunctiva is pale and sclerae nonicteric. Oropharyngeal mucosa is normal. She has dentures in place. No neck masses or thyromegaly noted. Cardiac exam with irregular rhythm normal S1 and S2. No murmur or gallop noted. Lungs are clear to  auscultation. Abdomen is full with small umbilicus hernia. Bowel sounds are hyperactive. Abdomen is soft with mild tenderness below the left costal margin. Unlikely hernias completely reducible. No organomegaly or masses noted. She has nonpitting edema to lower extremities with dry scaly skin. She has keratosis over face neck and chest   03/19 0701 - 03/20 0700 In: -  Out: 900 [Urine:900] Intake/Output this shift:     Lab Results:  Recent Labs  08/17/13 1412 08/18/13 0549  WBC 6.1 3.3*  HGB 10.3* 8.6*  HCT 33.1* 26.4*  PLT 235 173   BMET  Recent Labs  08/17/13 1412 08/18/13 0549  NA 129* 131*  K 5.3 4.6  CL 91* 96  CO2 26 30  GLUCOSE 124* 69*  BUN 11 9  CREATININE 1.09 1.02  CALCIUM 9.5 8.8   LFT  Recent Labs  08/17/13 1412  PROT 7.5  ALBUMIN 2.9*  AST 27  ALT 16  ALKPHOS 90  BILITOT 1.4*   PT/INR  Recent Labs  08/17/13 1412 08/18/13 0549  LABPROT 28.3* 31.8*  INR 2.77* 3.23*     Studies/Results: Ct Abdomen Pelvis W Contrast  08/17/2013   CLINICAL DATA:  Vomiting for 3 weeks, abdominal pain, history Crohn's disease, diabetes, hypertension, CHF, atrial fibrillation, COPD, cirrhosis  EXAM: CT ABDOMEN AND PELVIS WITH CONTRAST  TECHNIQUE: Multidetector CT imaging of the abdomen and pelvis was performed using the standard protocol following bolus administration of intravenous contrast. Sagittal and coronal MPR images reconstructed from axial data set.  CONTRAST:  170mL OMNIPAQUE IOHEXOL 300 MG/ML SOLN. Dilute oral contrast.  COMPARISON:  08/08/2013  FINDINGS: Small bibasilar pleural effusions greater on right with adjacent compressive atelectasis of the lower lobes.  Paraesophageal varices.  Cirrhotic appearing liver with ascites.  Perisplenic collaterals noted secondary to spontaneous splenorenal shunt.  Additional collaterals in anterior wall and periumbilical region (caput medusa).  Single stable calcification at pancreatic head.  No additional focal  abnormalities of the liver, spleen, pancreas, kidneys, or adrenal glands.  Scattered surgical clips in upper abdomen post ileocolic resection.  Bowel wall thickening of mid transverse colon question related to Crohn's disease, similar distribution to prior study.  Scattered normal sized to minimally enlarged mediastinal and retroperitoneal lymph nodes.  Stomach and small bowel loops normal appearance.  Left ovarian cyst 4.3 x 4.0 cm.  No additional mass, adenopathy, free air or hernia.  No acute osseous findings.  Scattered soft tissue edema/anasarca.  IMPRESSION: Post ileocolic resection with segmental wall thickening of the mid transverse colon, question related to Crohn's disease or other colitis such as infection or ischemia ; tumor consider less likely due to the length of involvement but not entirely excluded.  Consider follow-up colonoscopy if this fails to resolve.  Nonspecific adenopathy.  Cirrhotic liver with upper abdominal collaterals, ascites, and a spontaneous splenorenal shunt.  Left ovarian cyst mildly increased in size since previous exam.   Electronically Signed   By: Lavonia Dana M.D.   On: 08/17/2013 17:04    Assessment; Patient is 79 year old Caucasian female with history of Crohn's disease who presents with abdominal pain nausea vomiting and obstipation of 3 days duration. Patient has known  anastomotic ileocolonic stricture which has been dilated a few times most recently one year ago. She also has anorectal stricture which was dilated one year ago. Patient's acute symptoms have resolved with supportive therapy and she is tolerating full liquids. She has stricture with inflammatory changes at anastomosis but small bowel appears to be less dilated compared to study from October 2014 and not much different from the study from last admission. She will benefit from few weeks of steroid therapy. Given that she is diabetic glucose levels left to be monitored closely. If symptoms relapse she will  need stricture dilation for which she'll have to be off anticoagulant. Regarding anemia, her hemoglobin was over 11 g in December 2004. It was 10.3 on admission and 8.6 today. There is no evidence of overt GI bleed. Patient may also benefit from low dose polyethylene glycol or stool softener. She has chronic GERD and her heartburn is well controlled with therapy. She also has cirrhosis secondary to NAFLD. Last AFP was in August 2014.   Recommendations; CRP on serum from this morning. AFP. Hemoccult x1. Solu-Medrol 30 mg IV every 12 hours. Will advance diet to low residue starting at breakfast tomorrow unless she has problems with full liquids. CBC in a.m.   LOS: 1 day   Dewayne Severe U  08/18/2013, 12:57 PM

## 2013-08-18 NOTE — Plan of Care (Signed)
Problem: Phase I Progression Outcomes Goal: Other Phase I Outcomes/Goals Outcome: Completed/Met Date Met:  08/18/13 COPD gold card education and booklet given to pt.  Pt verbalized understanding of all instructions

## 2013-08-18 NOTE — Care Management Note (Signed)
UR completed 

## 2013-08-18 NOTE — Progress Notes (Signed)
[facility] for Coumadin Indication: atrial fibrillation  Allergies  Allergen Reactions  . Sulfonamide Derivatives Other (See Comments)    Felt funny    Patient Measurements: Height: 4\' 11"  (149.9 cm) Weight: 194 lb 14.2 oz (88.4 kg) IBW/kg (Calculated) : 43.2  Vital Signs: Temp: 97.8 F (36.6 C) (03/20 0454) Temp src: Oral (03/20 0454) BP: 93/65 mmHg (03/20 0454) Pulse Rate: 84 (03/20 0454)  Labs:  Recent Labs  08/17/13 1412 08/18/13 0549  HGB 10.3* 8.6*  HCT 33.1* 26.4*  PLT 235 173  LABPROT 28.3* 31.8*  INR 2.77* 3.23*  CREATININE 1.09 1.02    Estimated Creatinine Clearance: 45.4 ml/min (by C-G formula based on Cr of 1.02).   Medical History: Past Medical History  Diagnosis Date  . Hypertension     x 5 years  . Paroxysmal atrial fibrillation   . Anxiety   . Crohn's disease   . 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 No clear documentation of this  . Diabetes mellitus   . Neuromuscular disorder     periph neuropathy  . Shortness of breath   . Arthritis   . GERD (gastroesophageal reflux disease)   . Blood transfusion   . COPD (chronic obstructive pulmonary disease)   . Cirrhosis     non aloholic   . Pleural effusion 02/13/2012  . RVH (right ventricular hypertrophy) 02/13/2012  . Anemia 02/13/2012  . Chronic respiratory failure 02/15/2012    With hypoxia and hypercapnia.  . Anasarca 01/2012    Ejection fraction 60-65%  . CHF (congestive heart failure)   . Small bowel obstruction     Medications:  Prescriptions prior to admission  Medication Sig Dispense Refill  . acetaminophen (TYLENOL) 500 MG tablet Take 500 mg by mouth every 8 (eight) hours as needed. Pain      . ALPRAZolam (XANAX) 0.5 MG tablet Take 0.25-0.5 mg by mouth daily.      Marland Kitchen diltiazem (CARDIZEM CD) 180 MG 24 hr capsule Take 180 mg by mouth daily.      Marland Kitchen doxycycline (VIBRA-TABS) 100 MG tablet Take 1 tablet (100 mg total) by mouth 2  (two) times daily.  14 tablet  0  . ferrous sulfate 325 (65 FE) MG tablet Take 325 mg by mouth. Patient takes this medication 3 times per week      . furosemide (LASIX) 20 MG tablet Take 20 mg by mouth daily.      Marland Kitchen gabapentin (NEURONTIN) 100 MG capsule Take 100 mg by mouth at bedtime.      Marland Kitchen HYDROcodone-acetaminophen (NORCO) 5-325 MG per tablet 1-2 tabs po q6 hours prn pain  40 tablet  0  . lansoprazole (PREVACID) 30 MG capsule Take 30 mg by mouth daily as needed (for acid reflux).       . mesalamine (PENTASA) 500 MG CR capsule Take 2 capsules (1,000 mg total) by mouth 3 (three) times daily.  180 capsule  2  . metFORMIN (GLUCOPHAGE) 1000 MG tablet Take 1,000 mg by mouth 2 (two) times daily with a meal.      . Multiple Vitamin (MULTIVITAMIN) tablet Take 1 tablet by mouth daily.        . ondansetron (ZOFRAN) 4 MG tablet Take 4 mg by mouth every 8 (eight) hours as needed for nausea or vomiting.      . potassium chloride (K-DUR) 10 MEQ tablet Take 10 mEq by mouth every morning.      Marland Kitchen  spironolactone (ALDACTONE) 50 MG tablet Take 50 mg by mouth 2 (two) times daily.      . vitamin C (ASCORBIC ACID) 500 MG tablet Take 500 mg by mouth daily.      Marland Kitchen warfarin (COUMADIN) 1 MG tablet Take 0.5-1 mg by mouth daily. Patient takes 1mg  on Tues.and Thurs. And 0.5mg  all other days        Assessment: 78 yo F on chronic Coumadin for Afib.  She takes 1mg   On Tues & Thurs and 0.5mg  on Mon, Wed, Fri,Sat, & Sun.   INR was therapeutic on admission, but elevated today. Patient has been started on Cipro & Flagyl which can interact with warfarin & cause elevated INR.  No bleeding noted.    Goal of Therapy:  INR 2-3   Plan:  Hold Coumadin today Daily INR  Biagio Borg 08/18/2013,8:12 AM

## 2013-08-19 DIAGNOSIS — K5090 Crohn's disease, unspecified, without complications: Secondary | ICD-10-CM

## 2013-08-19 DIAGNOSIS — E119 Type 2 diabetes mellitus without complications: Secondary | ICD-10-CM

## 2013-08-19 DIAGNOSIS — K56609 Unspecified intestinal obstruction, unspecified as to partial versus complete obstruction: Secondary | ICD-10-CM

## 2013-08-19 LAB — PROTIME-INR
INR: 3.24 — ABNORMAL HIGH (ref 0.00–1.49)
Prothrombin Time: 31.9 seconds — ABNORMAL HIGH (ref 11.6–15.2)

## 2013-08-19 LAB — GLUCOSE, CAPILLARY
GLUCOSE-CAPILLARY: 118 mg/dL — AB (ref 70–99)
GLUCOSE-CAPILLARY: 243 mg/dL — AB (ref 70–99)
GLUCOSE-CAPILLARY: 129 mg/dL — AB (ref 70–99)

## 2013-08-19 LAB — CBC
HEMATOCRIT: 31.2 % — AB (ref 36.0–46.0)
Hemoglobin: 10 g/dL — ABNORMAL LOW (ref 12.0–15.0)
MCH: 26 pg (ref 26.0–34.0)
MCHC: 32.1 g/dL (ref 30.0–36.0)
MCV: 81 fL (ref 78.0–100.0)
Platelets: 180 10*3/uL (ref 150–400)
RBC: 3.85 MIL/uL — ABNORMAL LOW (ref 3.87–5.11)
RDW: 17.8 % — AB (ref 11.5–15.5)
WBC: 3.8 10*3/uL — AB (ref 4.0–10.5)

## 2013-08-19 LAB — AFP TUMOR MARKER: AFP TUMOR MARKER: 2.6 ng/mL (ref 0.0–8.0)

## 2013-08-19 MED ORDER — METRONIDAZOLE 500 MG PO TABS
500.0000 mg | ORAL_TABLET | Freq: Three times a day (TID) | ORAL | Status: DC
  Administered 2013-08-19: 500 mg via ORAL
  Filled 2013-08-19: qty 1

## 2013-08-19 MED ORDER — CIPROFLOXACIN HCL 250 MG PO TABS: 500.0000 mg | ORAL_TABLET | Freq: Two times a day (BID) | ORAL | Status: DC

## 2013-08-19 MED ORDER — POLYETHYLENE GLYCOL 3350 17 G PO PACK: 17.0000 g | PACK | Freq: Every day | ORAL | Status: DC | PRN

## 2013-08-19 MED ORDER — CIPROFLOXACIN HCL 500 MG PO TABS: 500.0000 mg | ORAL_TABLET | Freq: Two times a day (BID) | ORAL | Status: DC

## 2013-08-19 MED ORDER — PREDNISONE 20 MG PO TABS: 30.0000 mg | ORAL_TABLET | Freq: Every day | ORAL | Status: DC

## 2013-08-19 MED ORDER — PREDNISONE 5 MG PO TABS: ORAL_TABLET | ORAL | Status: DC

## 2013-08-19 MED ORDER — METRONIDAZOLE 500 MG PO TABS: 500.0000 mg | ORAL_TABLET | Freq: Three times a day (TID) | ORAL | Status: DC

## 2013-08-19 NOTE — Discharge Summary (Addendum)
Physician Discharge Summary  MARGARINE GROSSHANS WEX:[id number] DOB: 11/24/1935 DOA: 08/17/2013  PCP: Redge Gainer, MD  Admit date: 08/17/2013 Discharge date: 08/19/2013  Recommendations for Outpatient Follow-up:  1. Followup relapse of Crohn's disease. Gastroenterology office will contact her for appointment. 2. Followup anemia of chronic disease as clinically indicated. 3. Repeat PT/INR 3/23, currently on antibiotics. I've instructed her to hold warfarin 3/21 and 3/22.  Lab Results  Component Value Date   INR 3.24* 08/19/2013   INR 3.23* 08/18/2013   INR 2.77* 08/17/2013   4. Follow diabetes currently on prednisone, minimal insulin requirements during hospitalization.   Follow-up Information   Follow up with Redge Gainer, MD. Schedule an appointment as soon as possible for a visit in 1 week.   Specialty:  Family Medicine   Contact information:   [address] [place] [address] [phone]      Discharge Diagnoses:  1. Abdominal pain, nausea, vomiting 2. Relapse of Crohn's disease with symptoms of partial small bowel obstruction 3. Lactic acidosis 4. Hyponatremia 5. History of Crohn's disease with history of ileocolonic and anorectal strictures. 6. Anemia of chronic disease. 7. Diabetes mellitus. 8. History of paroxysmal atrial fibrillation on warfarin  Discharge Condition: Improved Disposition: Home  Diet recommendation: Diabetic heart healthy diet  Filed Weights   08/17/13 1332 08/17/13 2206 08/19/13 0424  Weight: 87.998 kg (194 lb) 88.4 kg (194 lb 14.2 oz) 88.361 kg (194 lb 12.8 oz)    History of present illness:  78 year old woman with history of Crohn's disease recently admitted for abdominal pain with nonspecific CT of the abdomen and pelvis, nausea and vomiting which resolved the next day and cellulitis of the left leg, discharged on doxycycline who presented to the emergency department with worsening periumbilical pain and epigastric pain, nausea but no  vomiting and no bowel movement for 3 days. Admitted for acute colitis, GI consultation.  Hospital Course:  Patient was admitted for further evaluation of colitis and GI consultation. She had multiple bowel movements after administration of oral contrast with resolution of her abdominal pain and vomiting. She was seen in consultation with gastroenterology, recommended steroids. She is tolerating a diet now with no abdominal pain, has had multiple bowel movements it is felt to be stable for discharge at this time per GI. Individual issues as below.   1. Abdominal pain, nausea, vomiting. Resolved after oral contrast. Thought to be secondary to Crohn's disease as below. 2. Relapse of Crohn's disease with symptoms of partial small bowel obstruction. Resolved. Steroids, abx per GI for another week. 3. Lactic acidosis. Spontaneously resolved. Likely secondary to dehydration. 4. Hyponatremia. Improved with IV fluids. 5. History of Crohn's disease with history of ileocolonic and anorectal strictures, status post surgery with ileocolonic anastomosis. 6. Anemia of chronic disease. Stable. 7. Recent diagnosis of cellulitis left lower extremity continue doxycycline. Appears resolved. 8. Diabetes mellitus. Stable. May resume metformin 3/22. 9. History of paroxysmal atrial fibrillation on warfarin. INR elevated.  10. Non-alcohol fatty liver disease, history of cirrhosis. Stable. Discussed with gastroenterology, plan discharge home on prednisone 30 mg per day, decrease by 5 mg every week. If symptoms relapse on steroids GI may consider balloon dilation of ileocolonic anastomotic stricture otherwise will do Gastrografin enema when she finishes prednisone. May resume metformin 3/22  She will need to monitor her blood sugars while on prednisone. I discussed this with her. She has required minimal insulin while on high-dose steroids, doubt she will have any problems with significant hyperglycemia.  Miralax as  needed  Consultants:  GI Procedures: none Antibiotics:  Cipro 3/19 >> 3/27 Flagyl 3/19 >> 3/27  Discharge Instructions  Discharge Orders   Future Appointments Provider Department Dept Phone   08/21/2013 11:30 AM Rogene Houston, MD [place] [phone]   09/27/2013 8:30 AM Chipper Herb, MD [place] [phone]   11/27/2013 3:00 PM Rogene Houston, MD [place] [phone]   Future Orders Complete By Expires   Activity as tolerated - No restrictions  As directed    Diet Carb Modified  As directed    Discharge instructions  As directed    Comments:     Call physician or seek immediate medical attention for recurrent abdominal pain, vomiting, fever or worsening of condition. Hold your Coumadin tonight 3/21 and tomorrow 3/22. Go to your primary care physician's office 3/23 for Pt/INR check and your doctor will advise you on resuming Coumadin. Check your blood sugars every day. Call your physician for blood sugars greater than 300.       Medication List    STOP taking these medications       doxycycline 100 MG tablet  Commonly known as:  VIBRA-TABS      TAKE these medications       acetaminophen 500 MG tablet  Commonly known as:  TYLENOL  Take 500 mg by mouth every 8 (eight) hours as needed. Pain     ALPRAZolam 0.5 MG tablet  Commonly known as:  XANAX  Take 0.25-0.5 mg by mouth daily.     ciprofloxacin 500 MG tablet  Commonly known as:  CIPRO  Take 1 tablet (500 mg total) by mouth 2 (two) times daily.     diltiazem 180 MG 24 hr capsule  Commonly known as:  CARDIZEM CD  Take 180 mg by mouth daily.     ferrous sulfate 325 (65 FE) MG tablet  Take 325 mg by mouth. Patient takes this medication 3 times per week     furosemide 20 MG tablet  Commonly known as:  LASIX  Take 20 mg by mouth daily.     gabapentin 100 MG capsule  Commonly known as:  NEURONTIN  Take 100 mg by mouth at bedtime.      HYDROcodone-acetaminophen 5-325 MG per tablet  Commonly known as:  NORCO  1-2 tabs po q6 hours prn pain     lansoprazole 30 MG capsule  Commonly known as:  PREVACID  Take 30 mg by mouth daily as needed (for acid reflux).     mesalamine 500 MG CR capsule  Commonly known as:  PENTASA  Take 2 capsules (1,000 mg total) by mouth 3 (three) times daily.     metFORMIN 1000 MG tablet  Commonly known as:  GLUCOPHAGE  Take 1,000 mg by mouth 2 (two) times daily with a meal.     metroNIDAZOLE 500 MG tablet  Commonly known as:  FLAGYL  Take 1 tablet (500 mg total) by mouth every 8 (eight) hours.     multivitamin tablet  Take 1 tablet by mouth daily.     ondansetron 4 MG tablet  Commonly known as:  ZOFRAN  Take 4 mg by mouth every 8 (eight) hours as needed for nausea or vomiting.     polyethylene glycol packet  Commonly known as:  MIRALAX  Take 17 g by mouth daily as needed for mild constipation.     potassium chloride 10 MEQ tablet  Commonly known as:  K-DUR  Take 10 mEq by mouth every morning.     predniSONE 5 MG tablet  Commonly known as:  DELTASONE  Take 30 mg by mouth daily for 7 days, then take 25 mg by mouth daily for 7 days, then take 20 mg by mouth for 7 days, then take 15 mg by mouth daily for 7 days, then take 10 mg by mouth daily for 7 days, then take 5 mg by mouth daily for 7 days and then stop.     spironolactone 50 MG tablet  Commonly known as:  ALDACTONE  Take 50 mg by mouth 2 (two) times daily.     vitamin C 500 MG tablet  Commonly known as:  ASCORBIC ACID  Take 500 mg by mouth daily.     warfarin 1 MG tablet  Commonly known as:  COUMADIN  Take 0.5-1 mg by mouth daily. Patient takes 1mg  on Tues.and Thurs. And 0.5mg  all other days       Allergies  Allergen Reactions  . Sulfonamide Derivatives Other (See Comments)    Felt funny   The results of significant diagnostics from this hospitalization (including imaging, microbiology, ancillary and laboratory) are  listed below for reference.    Significant Diagnostic Studies: Ct Abdomen Pelvis W Contrast  08/17/2013   CLINICAL DATA:  Vomiting for 3 weeks, abdominal pain, history Crohn's disease, diabetes, hypertension, CHF, atrial fibrillation, COPD, cirrhosis  EXAM: CT ABDOMEN AND PELVIS WITH CONTRAST  TECHNIQUE: Multidetector CT imaging of the abdomen and pelvis was performed using the standard protocol following bolus administration of intravenous contrast. Sagittal and coronal MPR images reconstructed from axial data set.  CONTRAST:  137mL OMNIPAQUE IOHEXOL 300 MG/ML SOLN. Dilute oral contrast.  COMPARISON:  08/08/2013  FINDINGS: Small bibasilar pleural effusions greater on right with adjacent compressive atelectasis of the lower lobes.  Paraesophageal varices.  Cirrhotic appearing liver with ascites.  Perisplenic collaterals noted secondary to spontaneous splenorenal shunt.  Additional collaterals in anterior wall and periumbilical region (caput medusa).  Single stable calcification at pancreatic head.  No additional focal abnormalities of the liver, spleen, pancreas, kidneys, or adrenal glands.  Scattered surgical clips in upper abdomen post ileocolic resection.  Bowel wall thickening of mid transverse colon question related to Crohn's disease, similar distribution to prior study.  Scattered normal sized to minimally enlarged mediastinal and retroperitoneal lymph nodes.  Stomach and small bowel loops normal appearance.  Left ovarian cyst 4.3 x 4.0 cm.  No additional mass, adenopathy, free air or hernia.  No acute osseous findings.  Scattered soft tissue edema/anasarca.  IMPRESSION: Post ileocolic resection with segmental wall thickening of the mid transverse colon, question related to Crohn's disease or other colitis such as infection or ischemia ; tumor consider less likely due to the length of involvement but not entirely excluded.  Consider follow-up colonoscopy if this fails to resolve.  Nonspecific adenopathy.   Cirrhotic liver with upper abdominal collaterals, ascites, and a spontaneous splenorenal shunt.  Left ovarian cyst mildly increased in size since previous exam.   Electronically Signed   By: Lavonia Dana M.D.   On: 08/17/2013 17:04   Microbiology: Recent Results (from the past 240 hour(s))  CLOSTRIDIUM DIFFICILE BY PCR     Status: None   Collection Time    08/18/13  6:18 PM      Result Value Ref Range Status   C difficile by pcr NEGATIVE  NEGATIVE Final     Labs: Basic Metabolic Panel:  Recent Labs Lab 08/17/13  1412 08/18/13 0549  NA 129* 131*  K 5.3 4.6  CL 91* 96  CO2 26 30  GLUCOSE 124* 69*  BUN 11 9  CREATININE 1.09 1.02  CALCIUM 9.5 8.8   Liver Function Tests:  Recent Labs Lab 08/17/13 1412  AST 27  ALT 16  ALKPHOS 90  BILITOT 1.4*  PROT 7.5  ALBUMIN 2.9*    Recent Labs Lab 08/17/13 1412  LIPASE 74*   CBC:  Recent Labs Lab 08/14/13 1055 08/17/13 1412 08/18/13 0549 08/19/13 0530  WBC 6.9 6.1 3.3* 3.8*  NEUTROABS  --  4.4  --   --   HGB 9.9* 10.3* 8.6* 10.0*  HCT 32.3* 33.1* 26.4* 31.2*  MCV 78.6* 81.1 80.0 81.0  PLT  --  235 173 180   CBG:  Recent Labs Lab 08/18/13 1131 08/18/13 1701 08/18/13 2120 08/19/13 0750 08/19/13 1135  GLUCAP 86 123* 175* 118* 243*    Principal Problem:   Acute colitis Active Problems:   DM   ANXIETY   HYPERTENSION, UNSPECIFIED   Atrial fibrillation   CROHN'S DISEASE   Abdominal pain   Hyponatremia   Elevated lactic acid level   Hyperkalemia   Time coordinating discharge: 35 minutes  Signed:  Murray Hodgkins, MD [facility] 08/19/2013, 3:26 PM

## 2013-08-19 NOTE — Plan of Care (Signed)
Problem: COPD GOLD Progrssion Goal: Arrange DC Appointments for Pulmonary Clinic & PCP Arrange Discharge Appointments for Pulmonary Clinic and Primary Care Physician  Outcome: Completed/Met Date Met:  08/19/13 PCP Goal: Other COPD GOLD Goal(s) Outcome: Completed/Met Date Met:  08/19/13 Pt received booklet and gold card

## 2013-08-19 NOTE — Progress Notes (Signed)
PROGRESS NOTE  [name] CBJ:[id number] DOB: 04-06-1936 DOA: 08/17/2013 PCP: Redge Gainer, MD  Summary: 78 year old woman with history of Crohn's disease recently admitted for abdominal pain with nonspecific CT of the abdomen and pelvis, nausea and vomiting which resolved the next day and cellulitis of the left leg, discharged on doxycycline who presented to the emergency department with worsening periumbilical pain and epigastric pain, nausea but no vomiting and no bowel movement for 3 days. Admitted for acute colitis, GI consultation.  Assessment/Plan: 1. Abdominal pain, nausea, vomiting. Resolved after oral contrast. Thought to be secondary to Crohn's disease as below. 2. Relapse of Crohn's disease with symptoms of partial small bowel obstruction. Resolved. Steroids. GI. 3. Lactic acidosis. Spontaneously resolved. Likely secondary to dehydration. 4. Hyponatremia. Improved with IV fluids. 5. History of Crohn's disease with history of ileocolonic and anorectal strictures, status post surgery with ileocolonic anastomosis. 6. Anemia of chronic disease. Stable. 7. Recent diagnosis of cellulitis left lower extremity continue doxycycline. Appears resolved. 8. Diabetes mellitus. Stable. May resume metformin 3/22. 9. History of paroxysmal atrial fibrillation on warfarin. INR elevated.  10. Non-alcohol fatty liver disease, history of cirrhosis. Stable.   Continues to improve, no abdominal pain or vomiting. Tolerating diet with bowel movements. Discussed with gastroenterology, plan discharge home on prednisone 30 mg per day, decrease by 5 mg every week.  If symptoms relapse on steroids GI may consider balloon dilation of ileocolonic anastomotic stricture otherwise will do Gastrografin enema when she finishes prednisone.  May resume metformin 3/22  Hold warfarin today.  She will need to monitor her blood sugars while on prednisone. I discussed this with her. She has required minimal  insulin while on high-dose steroids, doubt she will have any problems with significant hyperglycemia.  Miralax as needed  Code Status: full code DVT prophylaxis: warfarin Family Communication: none present Disposition Plan: home when improved  Murray Hodgkins, MD  [facility]  Pager [phone] If 7PM-7AM, please contact night-coverage at [email or web address], password [facility] 08/19/2013, 2:18 PM  LOS: 2 days   Consultants:  GI  Procedures:    Antibiotics:  Cipro 3/19 >>   Flagyl 3/19 >>   HPI/Subjective: Feels good. No vomiting, no abdominal pain. Bowels moving. Ate lunch without difficulty. Tolerating meals.  Objective: Filed Vitals:   08/18/13 1328 08/18/13 2036 08/19/13 0424 08/19/13 1059  BP: 91/54 110/64 110/45 103/52  Pulse: 79 81 92   Temp: 97.9 F (36.6 C) 98.4 F (36.9 C) 98.5 F (36.9 C)   TempSrc: Oral Oral Oral   Resp: 20 20 20    Height:      Weight:   88.361 kg (194 lb 12.8 oz)   SpO2: 95% 93% 100%     Intake/Output Summary (Last 24 hours) at 08/19/13 1418 Last data filed at 08/19/13 1200  Gross per 24 hour  Intake    960 ml  Output      0 ml  Net    960 ml     Filed Weights   08/17/13 1332 08/17/13 2206 08/19/13 0424  Weight: 87.998 kg (194 lb) 88.4 kg (194 lb 14.2 oz) 88.361 kg (194 lb 12.8 oz)    Exam:   Afebrile, vital signs are stable. No hypoxia.  Gen. Appears calm and comfortable.  Cardiovascular regular rate and rhythm. No murmur, rub or gallop.   Respiratory clear to auscultation bilaterally. No wheezes, rales or rhonchi. Normal respiratory effort.  Psychiatric grossly normal mood and affect. Speech fluent and appropriate.  Abdomen soft, nontender, nondistended. Positive  bowel sounds. Soft umbilical hernia.  Data Reviewed:  No recurrent hypoglycemia. Capillary blood sugars stable.  CBC stable, hemoglobin 10.0, WBC 3.8. Normal platelet count.  INR stable 3.24  Scheduled Meds: . ALPRAZolam  0.25-0.5 mg Oral Daily  .  ciprofloxacin  400 mg Intravenous Q12H  . diltiazem  180 mg Oral Daily  . ferrous sulfate  325 mg Oral Q breakfast  . furosemide  20 mg Oral Daily  . gabapentin  100 mg Oral QHS  . insulin aspart  0-15 Units Subcutaneous TID WC  . mesalamine  1,000 mg Oral TID  . methylPREDNISolone (SOLU-MEDROL) injection  30 mg Intravenous Q12H  . metronidazole  500 mg Intravenous Q8H  . multivitamin with minerals  1 tablet Oral Daily  . pantoprazole  40 mg Oral Daily  . vitamin C  500 mg Oral Daily  . Warfarin - Pharmacist Dosing Inpatient   Does not apply Q24H   Continuous Infusions:    Principal Problem:   Acute colitis Active Problems:   DM   ANXIETY   HYPERTENSION, UNSPECIFIED   Atrial fibrillation   CROHN'S DISEASE   Abdominal pain   Hyponatremia   Elevated lactic acid level   Hyperkalemia

## 2013-08-19 NOTE — Progress Notes (Signed)
[place] for Coumadin Indication: atrial fibrillation  Allergies  Allergen Reactions  . Sulfonamide Derivatives Other (See Comments)    Felt funny    Patient Measurements: Height: 4\' 11"  (149.9 cm) Weight: 194 lb 12.8 oz (88.361 kg) IBW/kg (Calculated) : 43.2  Vital Signs: Temp: 98.5 F (36.9 C) (03/21 0424) Temp src: Oral (03/21 0424) BP: 110/45 mmHg (03/21 0424) Pulse Rate: 92 (03/21 0424)  Labs:  Recent Labs  08/17/13 1412 08/18/13 0549 08/19/13 0530  HGB 10.3* 8.6* 10.0*  HCT 33.1* 26.4* 31.2*  PLT 235 173 180  LABPROT 28.3* 31.8* 31.9*  INR 2.77* 3.23* 3.24*  CREATININE 1.09 1.02  --     Estimated Creatinine Clearance: 45.4 ml/min (by C-G formula based on Cr of 1.02).   Medical History: Past Medical History  Diagnosis Date  . Hypertension     x 5 years  . Paroxysmal atrial fibrillation   . Anxiety   . Crohn's disease   . 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 No clear documentation of this  . Diabetes mellitus   . Neuromuscular disorder     periph neuropathy  . Shortness of breath   . Arthritis   . GERD (gastroesophageal reflux disease)   . Blood transfusion   . COPD (chronic obstructive pulmonary disease)   . Cirrhosis     non aloholic   . Pleural effusion 02/13/2012  . RVH (right ventricular hypertrophy) 02/13/2012  . Anemia 02/13/2012  . Chronic respiratory failure 02/15/2012    With hypoxia and hypercapnia.  . Anasarca 01/2012    Ejection fraction 60-65%  . CHF (congestive heart failure)   . Small bowel obstruction     Medications:  Prescriptions prior to admission  Medication Sig Dispense Refill  . acetaminophen (TYLENOL) 500 MG tablet Take 500 mg by mouth every 8 (eight) hours as needed. Pain      . ALPRAZolam (XANAX) 0.5 MG tablet Take 0.25-0.5 mg by mouth daily.      Marland Kitchen diltiazem (CARDIZEM CD) 180 MG 24 hr capsule Take 180 mg by mouth daily.      Marland Kitchen doxycycline (VIBRA-TABS)  100 MG tablet Take 1 tablet (100 mg total) by mouth 2 (two) times daily.  14 tablet  0  . ferrous sulfate 325 (65 FE) MG tablet Take 325 mg by mouth. Patient takes this medication 3 times per week      . furosemide (LASIX) 20 MG tablet Take 20 mg by mouth daily.      Marland Kitchen gabapentin (NEURONTIN) 100 MG capsule Take 100 mg by mouth at bedtime.      Marland Kitchen HYDROcodone-acetaminophen (NORCO) 5-325 MG per tablet 1-2 tabs po q6 hours prn pain  40 tablet  0  . lansoprazole (PREVACID) 30 MG capsule Take 30 mg by mouth daily as needed (for acid reflux).       . mesalamine (PENTASA) 500 MG CR capsule Take 2 capsules (1,000 mg total) by mouth 3 (three) times daily.  180 capsule  2  . metFORMIN (GLUCOPHAGE) 1000 MG tablet Take 1,000 mg by mouth 2 (two) times daily with a meal.      . Multiple Vitamin (MULTIVITAMIN) tablet Take 1 tablet by mouth daily.        . ondansetron (ZOFRAN) 4 MG tablet Take 4 mg by mouth every 8 (eight) hours as needed for nausea or vomiting.      . potassium chloride (K-DUR) 10 MEQ tablet Take 10 mEq  by mouth every morning.      Marland Kitchen spironolactone (ALDACTONE) 50 MG tablet Take 50 mg by mouth 2 (two) times daily.      . vitamin C (ASCORBIC ACID) 500 MG tablet Take 500 mg by mouth daily.      Marland Kitchen warfarin (COUMADIN) 1 MG tablet Take 0.5-1 mg by mouth daily. Patient takes 1mg  on Tues.and Thurs. And 0.5mg  all other days        Assessment: 78 yo F on chronic Coumadin for Afib.  She takes 1mg   On Tues & Thurs and 0.5mg  on Mon, Wed, Fri,Sat, & Sun.   INR was therapeutic on admission, but elevated for 2nd day after holding Coumadin yesterdat Patient has been started on Cipro & Flagyl which can interact with warfarin & cause elevated INR.  No bleeding noted.    Goal of Therapy:  INR 2-3   Plan:  Hold Coumadin today Daily INR  Abner Greenspan, Noura Purpura Bennett 08/19/2013,8:18 AM

## 2013-08-19 NOTE — Progress Notes (Signed)
Pt is to be discharged home today. Pt is in NAD, IV is out, all paperwork has been reviewed/discussed with patient, and there are no questions/concerns at this time. Assessment is unchanged from this morning. Pt is to be accompanied downstairs by staff and family via wheelchair.  

## 2013-08-19 NOTE — Progress Notes (Signed)
Subjective:  Patient has no complaints. She has good appetite. She denies nausea vomiting or heartburn. She states she has not experienced abdominal pain for 2 days. She is passing loose stools but denies melena or rectal bleeding. She has had 2 loose stools this morning.   Objective: Blood pressure 110/45, pulse 92, temperature 98.5 F (36.9 C), temperature source Oral, resp. rate 20, height 4\' 11"  (1.499 m), weight 194 lb 12.8 oz (88.361 kg), SpO2 100.00%. Patient is alert and appears to be comfortable. Conjunctiva is pink. Sclera is nonicteric Oropharyngeal mucosa is normal. No neck masses or thyromegaly noted. Cardiac exam with irregular rhythm normal S1 and S2.  Lungs are clear to auscultation. Abdomen. Abdomen is full. Bowel sounds are normal. She has small umbilicus hernia which is unchanged. Her abdomen remains very soft with minimal tenderness below the left costal margin.  Lower extremity edema is predominantly nonpitting.  Labs/studies Results:  Hemoccult is negative WBC 3.8, H&H 10 and 31.2 and platelet count 180K CRP less than 0.5 INR 3.24   Assessment:  #1. Relapse of Crohn's disease with symptoms of partial small bowel obstruction. Acute symptoms have resolved. She will need to continue steroids for 6 weeks. If symptoms relapse on steroids we'll consider balloon dilation of ileocolonic anastomotic stricture otherwise will do Gastrografin enema when she finishes prednisone. #2. Anemia. H&H is up. Stool is guaiac negative. #3. Mild leukopenia secondary to cirrhosis. #4. Cirrhosis secondary to NAFLD. Up to this point she has not had clinical sequelae of this condition.  Recommendations; Advance diet. Will be transitioned to prednisone 30 mg by mouth every morning at the time of discharge and the dose should be decreased by 5 mg every week. Patient has an appointment to be seen on 08/21/2013 but I would change it to 09/04/2013. My office will contact patient. AFP  ordered.

## 2013-08-21 ENCOUNTER — Ambulatory Visit (INDEPENDENT_AMBULATORY_CARE_PROVIDER_SITE_OTHER): Payer: Medicare Other | Admitting: Pharmacist

## 2013-08-21 ENCOUNTER — Telehealth: Payer: Self-pay | Admitting: *Deleted

## 2013-08-21 ENCOUNTER — Ambulatory Visit (INDEPENDENT_AMBULATORY_CARE_PROVIDER_SITE_OTHER): Payer: Medicare Other | Admitting: Internal Medicine

## 2013-08-21 DIAGNOSIS — I4891 Unspecified atrial fibrillation: Secondary | ICD-10-CM

## 2013-08-21 LAB — POCT INR: INR: 2.5

## 2013-08-21 NOTE — Progress Notes (Signed)
Apt has been scheduled for 09/12/13 with Dr. Laural Golden.

## 2013-08-21 NOTE — Telephone Encounter (Signed)
Therapeutic anticoagulation.  Restart warfarin 1mg  tuesdays and thursdays and 0.5mg  (1/2 tablet) all other days and recheck Thursday 08/24/13.\ [name] with [facility] notified about dose and order for recheck. Patient also notified.

## 2013-08-21 NOTE — Telephone Encounter (Signed)
Telephone call from [place] with [facility].      PT 30.5,   INR 2.5. Telephone number for [facility] [phone]

## 2013-08-22 NOTE — Care Management Note (Signed)
UR completed 

## 2013-08-23 ENCOUNTER — Other Ambulatory Visit (INDEPENDENT_AMBULATORY_CARE_PROVIDER_SITE_OTHER): Payer: Self-pay | Admitting: *Deleted

## 2013-08-23 ENCOUNTER — Encounter (INDEPENDENT_AMBULATORY_CARE_PROVIDER_SITE_OTHER): Payer: Self-pay | Admitting: *Deleted

## 2013-08-23 DIAGNOSIS — K5090 Crohn's disease, unspecified, without complications: Secondary | ICD-10-CM

## 2013-08-27 ENCOUNTER — Other Ambulatory Visit: Payer: Self-pay | Admitting: Family Medicine

## 2013-08-28 ENCOUNTER — Encounter (HOSPITAL_COMMUNITY): Payer: Self-pay | Admitting: Emergency Medicine

## 2013-08-28 ENCOUNTER — Emergency Department (HOSPITAL_COMMUNITY)
Admission: EM | Admit: 2013-08-28 | Discharge: 2013-08-28 | Discharge status: Against Medical Advice | Payer: Medicare Other | Attending: Emergency Medicine | Admitting: Emergency Medicine

## 2013-08-28 ENCOUNTER — Telehealth (INDEPENDENT_AMBULATORY_CARE_PROVIDER_SITE_OTHER): Payer: Self-pay | Admitting: *Deleted

## 2013-08-28 DIAGNOSIS — I10 Essential (primary) hypertension: Secondary | ICD-10-CM | POA: Insufficient documentation

## 2013-08-28 DIAGNOSIS — E119 Type 2 diabetes mellitus without complications: Secondary | ICD-10-CM | POA: Insufficient documentation

## 2013-08-28 DIAGNOSIS — J449 Chronic obstructive pulmonary disease, unspecified: Secondary | ICD-10-CM | POA: Insufficient documentation

## 2013-08-28 DIAGNOSIS — I509 Heart failure, unspecified: Secondary | ICD-10-CM | POA: Insufficient documentation

## 2013-08-28 DIAGNOSIS — F17200 Nicotine dependence, unspecified, uncomplicated: Secondary | ICD-10-CM | POA: Insufficient documentation

## 2013-08-28 DIAGNOSIS — R079 Chest pain, unspecified: Secondary | ICD-10-CM | POA: Insufficient documentation

## 2013-08-28 DIAGNOSIS — J4489 Other specified chronic obstructive pulmonary disease: Secondary | ICD-10-CM | POA: Insufficient documentation

## 2013-08-28 DIAGNOSIS — R109 Unspecified abdominal pain: Secondary | ICD-10-CM | POA: Insufficient documentation

## 2013-08-28 LAB — URINALYSIS, ROUTINE W REFLEX MICROSCOPIC
BILIRUBIN URINE: NEGATIVE
Glucose, UA: NEGATIVE mg/dL
HGB URINE DIPSTICK: NEGATIVE
KETONES UR: NEGATIVE mg/dL
Nitrite: NEGATIVE
PH: 5.5 (ref 5.0–8.0)
PROTEIN: NEGATIVE mg/dL
Specific Gravity, Urine: 1.01 (ref 1.005–1.030)
Urobilinogen, UA: 0.2 mg/dL (ref 0.0–1.0)

## 2013-08-28 LAB — URINE MICROSCOPIC-ADD ON

## 2013-08-28 LAB — CBG MONITORING, ED: Glucose-Capillary: 160 mg/dL — ABNORMAL HIGH (ref 70–99)

## 2013-08-28 NOTE — Telephone Encounter (Signed)
Patient states that since last Sunday or Monday she is having horrible pain. Under both breast and will go around to her back. Worse @ night. Rumbling gas Feels worse that she did prior to recent [place]. Patient states that she is much weaker, cant sleep , nauseated. Afebrile, BM this morning, just colored water. Taking her gas x , and stool softner. Drinking water, and V8 juice. Call back # [phone]

## 2013-08-28 NOTE — ED Notes (Signed)
Pt c/o upper abd pain and pain under ribs since last Sunday.  Reports was recently admitted for lower abd pain and n/v.

## 2013-08-28 NOTE — Telephone Encounter (Signed)
Patient needs office visit.  

## 2013-08-28 NOTE — Telephone Encounter (Signed)
Per Dr.Rehman, the patient should go to the Ed for an evaluation. Patient was advised.

## 2013-08-28 NOTE — Telephone Encounter (Signed)
Please advise. Do not see on current med list 

## 2013-08-28 NOTE — Telephone Encounter (Signed)
Would like to speak with [name]. She just got out of the hospital last week and has started hurting really bad. The pain is in a difference location also not able to eat or sleep. The return phone number is [phone].

## 2013-08-29 ENCOUNTER — Telehealth: Payer: Self-pay | Admitting: *Deleted

## 2013-08-29 DIAGNOSIS — I4891 Unspecified atrial fibrillation: Secondary | ICD-10-CM

## 2013-08-29 DIAGNOSIS — D649 Anemia, unspecified: Secondary | ICD-10-CM

## 2013-08-29 DIAGNOSIS — M159 Polyosteoarthritis, unspecified: Secondary | ICD-10-CM

## 2013-08-29 DIAGNOSIS — J449 Chronic obstructive pulmonary disease, unspecified: Secondary | ICD-10-CM

## 2013-08-29 DIAGNOSIS — I10 Essential (primary) hypertension: Secondary | ICD-10-CM

## 2013-08-29 DIAGNOSIS — E119 Type 2 diabetes mellitus without complications: Secondary | ICD-10-CM

## 2013-08-29 DIAGNOSIS — G709 Myoneural disorder, unspecified: Secondary | ICD-10-CM

## 2013-08-29 DIAGNOSIS — I509 Heart failure, unspecified: Secondary | ICD-10-CM

## 2013-08-29 NOTE — Telephone Encounter (Signed)
Protime 29.1, INR 2.4 No new orders per vo Dr. Laurance Flatten, reck pt in one week

## 2013-08-31 ENCOUNTER — Other Ambulatory Visit: Payer: Self-pay | Admitting: *Deleted

## 2013-08-31 MED ORDER — VITAMIN D (ERGOCALCIFEROL) 1.25 MG (50000 UNIT) PO CAPS: 50000.0000 [IU] | ORAL_CAPSULE | ORAL | Status: AC

## 2013-08-31 NOTE — Telephone Encounter (Signed)
Patient was called about INR.  She was instructed to continue current warfarin dose of 1mg  tuesdays and thrusdays and 0.5mg  all other days.  Recheck INR in 1 week.

## 2013-09-05 ENCOUNTER — Encounter (HOSPITAL_COMMUNITY): Payer: Self-pay | Admitting: Emergency Medicine

## 2013-09-05 ENCOUNTER — Emergency Department (HOSPITAL_COMMUNITY): Payer: Medicare Other

## 2013-09-05 ENCOUNTER — Telehealth: Payer: Self-pay | Admitting: *Deleted

## 2013-09-05 ENCOUNTER — Inpatient Hospital Stay (HOSPITAL_COMMUNITY)
Admission: EM | Admit: 2013-09-05 | Discharge: 2013-09-08 | DRG: 385 | Disposition: A | Discharge status: Home Health | Payer: Medicare Other | Attending: Internal Medicine | Admitting: Internal Medicine

## 2013-09-05 DIAGNOSIS — K7460 Unspecified cirrhosis of liver: Secondary | ICD-10-CM

## 2013-09-05 DIAGNOSIS — Z800 Family history of malignant neoplasm of digestive organs: Secondary | ICD-10-CM

## 2013-09-05 DIAGNOSIS — E43 Unspecified severe protein-calorie malnutrition: Secondary | ICD-10-CM

## 2013-09-05 DIAGNOSIS — Z806 Family history of leukemia: Secondary | ICD-10-CM

## 2013-09-05 DIAGNOSIS — J189 Pneumonia, unspecified organism: Secondary | ICD-10-CM

## 2013-09-05 DIAGNOSIS — M129 Arthropathy, unspecified: Secondary | ICD-10-CM | POA: Diagnosis present

## 2013-09-05 DIAGNOSIS — D696 Thrombocytopenia, unspecified: Secondary | ICD-10-CM | POA: Diagnosis present

## 2013-09-05 DIAGNOSIS — J4489 Other specified chronic obstructive pulmonary disease: Secondary | ICD-10-CM | POA: Diagnosis present

## 2013-09-05 DIAGNOSIS — E663 Overweight: Secondary | ICD-10-CM

## 2013-09-05 DIAGNOSIS — Z8249 Family history of ischemic heart disease and other diseases of the circulatory system: Secondary | ICD-10-CM

## 2013-09-05 DIAGNOSIS — Z720 Tobacco use: Secondary | ICD-10-CM

## 2013-09-05 DIAGNOSIS — J90 Pleural effusion, not elsewhere classified: Secondary | ICD-10-CM

## 2013-09-05 DIAGNOSIS — J441 Chronic obstructive pulmonary disease with (acute) exacerbation: Secondary | ICD-10-CM

## 2013-09-05 DIAGNOSIS — D649 Anemia, unspecified: Secondary | ICD-10-CM

## 2013-09-05 DIAGNOSIS — Z79899 Other long term (current) drug therapy: Secondary | ICD-10-CM

## 2013-09-05 DIAGNOSIS — I517 Cardiomegaly: Secondary | ICD-10-CM

## 2013-09-05 DIAGNOSIS — I10 Essential (primary) hypertension: Secondary | ICD-10-CM

## 2013-09-05 DIAGNOSIS — D509 Iron deficiency anemia, unspecified: Secondary | ICD-10-CM | POA: Diagnosis present

## 2013-09-05 DIAGNOSIS — J449 Chronic obstructive pulmonary disease, unspecified: Secondary | ICD-10-CM

## 2013-09-05 DIAGNOSIS — K5080 Crohn's disease of both small and large intestine without complications: Principal | ICD-10-CM | POA: Diagnosis present

## 2013-09-05 DIAGNOSIS — F17200 Nicotine dependence, unspecified, uncomplicated: Secondary | ICD-10-CM | POA: Diagnosis present

## 2013-09-05 DIAGNOSIS — I5032 Chronic diastolic (congestive) heart failure: Secondary | ICD-10-CM | POA: Diagnosis present

## 2013-09-05 DIAGNOSIS — J9602 Acute respiratory failure with hypercapnia: Secondary | ICD-10-CM

## 2013-09-05 DIAGNOSIS — E872 Acidosis, unspecified: Secondary | ICD-10-CM

## 2013-09-05 DIAGNOSIS — L0390 Cellulitis, unspecified: Secondary | ICD-10-CM

## 2013-09-05 DIAGNOSIS — K624 Stenosis of anus and rectum: Secondary | ICD-10-CM | POA: Diagnosis present

## 2013-09-05 DIAGNOSIS — R7989 Other specified abnormal findings of blood chemistry: Secondary | ICD-10-CM

## 2013-09-05 DIAGNOSIS — E8809 Other disorders of plasma-protein metabolism, not elsewhere classified: Secondary | ICD-10-CM

## 2013-09-05 DIAGNOSIS — F411 Generalized anxiety disorder: Secondary | ICD-10-CM

## 2013-09-05 DIAGNOSIS — R5381 Other malaise: Secondary | ICD-10-CM

## 2013-09-05 DIAGNOSIS — J9610 Chronic respiratory failure, unspecified whether with hypoxia or hypercapnia: Secondary | ICD-10-CM

## 2013-09-05 DIAGNOSIS — Z7901 Long term (current) use of anticoagulants: Secondary | ICD-10-CM

## 2013-09-05 DIAGNOSIS — R05 Cough: Secondary | ICD-10-CM

## 2013-09-05 DIAGNOSIS — E875 Hyperkalemia: Secondary | ICD-10-CM

## 2013-09-05 DIAGNOSIS — R609 Edema, unspecified: Secondary | ICD-10-CM

## 2013-09-05 DIAGNOSIS — R059 Cough, unspecified: Secondary | ICD-10-CM

## 2013-09-05 DIAGNOSIS — E876 Hypokalemia: Secondary | ICD-10-CM

## 2013-09-05 DIAGNOSIS — K529 Noninfective gastroenteritis and colitis, unspecified: Secondary | ICD-10-CM

## 2013-09-05 DIAGNOSIS — R0600 Dyspnea, unspecified: Secondary | ICD-10-CM

## 2013-09-05 DIAGNOSIS — R0902 Hypoxemia: Secondary | ICD-10-CM

## 2013-09-05 DIAGNOSIS — I509 Heart failure, unspecified: Secondary | ICD-10-CM | POA: Diagnosis present

## 2013-09-05 DIAGNOSIS — K56609 Unspecified intestinal obstruction, unspecified as to partial versus complete obstruction: Secondary | ICD-10-CM

## 2013-09-05 DIAGNOSIS — E669 Obesity, unspecified: Secondary | ICD-10-CM | POA: Diagnosis present

## 2013-09-05 DIAGNOSIS — R109 Unspecified abdominal pain: Secondary | ICD-10-CM

## 2013-09-05 DIAGNOSIS — M8580 Other specified disorders of bone density and structure, unspecified site: Secondary | ICD-10-CM

## 2013-09-05 DIAGNOSIS — K5090 Crohn's disease, unspecified, without complications: Secondary | ICD-10-CM

## 2013-09-05 DIAGNOSIS — R6510 Systemic inflammatory response syndrome (SIRS) of non-infectious origin without acute organ dysfunction: Secondary | ICD-10-CM

## 2013-09-05 DIAGNOSIS — I878 Other specified disorders of veins: Secondary | ICD-10-CM

## 2013-09-05 DIAGNOSIS — Z85828 Personal history of other malignant neoplasm of skin: Secondary | ICD-10-CM

## 2013-09-05 DIAGNOSIS — E871 Hypo-osmolality and hyponatremia: Secondary | ICD-10-CM

## 2013-09-05 DIAGNOSIS — H5440 Blindness, one eye, unspecified eye: Secondary | ICD-10-CM

## 2013-09-05 DIAGNOSIS — E119 Type 2 diabetes mellitus without complications: Secondary | ICD-10-CM

## 2013-09-05 DIAGNOSIS — K219 Gastro-esophageal reflux disease without esophagitis: Secondary | ICD-10-CM | POA: Diagnosis present

## 2013-09-05 DIAGNOSIS — I4891 Unspecified atrial fibrillation: Secondary | ICD-10-CM

## 2013-09-05 HISTORY — DX: Umbilical hernia without obstruction or gangrene: K42.9

## 2013-09-05 LAB — CBC WITH DIFFERENTIAL/PLATELET
BASOS ABS: 0 10*3/uL (ref 0.0–0.1)
Basophils Relative: 0 % (ref 0–1)
EOS PCT: 0 % (ref 0–5)
Eosinophils Absolute: 0 10*3/uL (ref 0.0–0.7)
HEMATOCRIT: 36.3 % (ref 36.0–46.0)
Hemoglobin: 11.6 g/dL — ABNORMAL LOW (ref 12.0–15.0)
LYMPHS ABS: 0.6 10*3/uL — AB (ref 0.7–4.0)
LYMPHS PCT: 6 % — AB (ref 12–46)
MCH: 25.4 pg — ABNORMAL LOW (ref 26.0–34.0)
MCHC: 32 g/dL (ref 30.0–36.0)
MCV: 79.4 fL (ref 78.0–100.0)
MONO ABS: 0.3 10*3/uL (ref 0.1–1.0)
Monocytes Relative: 3 % (ref 3–12)
NEUTROS ABS: 8.1 10*3/uL — AB (ref 1.7–7.7)
Neutrophils Relative %: 91 % — ABNORMAL HIGH (ref 43–77)
Platelets: 164 10*3/uL (ref 150–400)
RBC: 4.57 MIL/uL (ref 3.87–5.11)
RDW: 18.9 % — ABNORMAL HIGH (ref 11.5–15.5)
WBC: 8.9 10*3/uL (ref 4.0–10.5)

## 2013-09-05 LAB — URINALYSIS, ROUTINE W REFLEX MICROSCOPIC
Bilirubin Urine: NEGATIVE
GLUCOSE, UA: NEGATIVE mg/dL
Hgb urine dipstick: NEGATIVE
Ketones, ur: NEGATIVE mg/dL
LEUKOCYTES UA: NEGATIVE
Nitrite: NEGATIVE
PH: 5.5 (ref 5.0–8.0)
Protein, ur: NEGATIVE mg/dL
Specific Gravity, Urine: 1.005 (ref 1.005–1.030)
Urobilinogen, UA: 0.2 mg/dL (ref 0.0–1.0)

## 2013-09-05 LAB — TROPONIN I

## 2013-09-05 LAB — COMPREHENSIVE METABOLIC PANEL
ALT: 47 U/L — ABNORMAL HIGH (ref 0–35)
AST: 39 U/L — AB (ref 0–37)
Albumin: 3 g/dL — ABNORMAL LOW (ref 3.5–5.2)
Alkaline Phosphatase: 82 U/L (ref 39–117)
BILIRUBIN TOTAL: 2.1 mg/dL — AB (ref 0.3–1.2)
BUN: 19 mg/dL (ref 6–23)
CALCIUM: 10.1 mg/dL (ref 8.4–10.5)
CHLORIDE: 87 meq/L — AB (ref 96–112)
CO2: 25 mEq/L (ref 19–32)
CREATININE: 1.02 mg/dL (ref 0.50–1.10)
GFR, EST AFRICAN AMERICAN: 60 mL/min — AB (ref >90)
GFR, EST NON AFRICAN AMERICAN: 52 mL/min — AB (ref >90)
GLUCOSE: 142 mg/dL — AB (ref 70–99)
Potassium: 5 mEq/L (ref 3.7–5.3)
Sodium: 126 mEq/L — ABNORMAL LOW (ref 137–147)
Total Protein: 7.1 g/dL (ref 6.0–8.3)

## 2013-09-05 LAB — GLUCOSE, CAPILLARY: Glucose-Capillary: 163 mg/dL — ABNORMAL HIGH (ref 70–99)

## 2013-09-05 LAB — LIPASE, BLOOD: Lipase: 54 U/L (ref 11–59)

## 2013-09-05 LAB — PROTIME-INR
INR: 2.18 — ABNORMAL HIGH (ref 0.00–1.49)
Prothrombin Time: 23.6 seconds — ABNORMAL HIGH (ref 11.6–15.2)

## 2013-09-05 LAB — LACTIC ACID, PLASMA: LACTIC ACID, VENOUS: 4.6 mmol/L — AB (ref 0.5–2.2)

## 2013-09-05 MED ORDER — GABAPENTIN 100 MG PO CAPS
100.0000 mg | ORAL_CAPSULE | Freq: Every day | ORAL | Status: DC
  Administered 2013-09-05 – 2013-09-07 (×3): 100 mg via ORAL
  Filled 2013-09-05 (×3): qty 1

## 2013-09-05 MED ORDER — INSULIN ASPART 100 UNIT/ML ~~LOC~~ SOLN
0.0000 [IU] | Freq: Three times a day (TID) | SUBCUTANEOUS | Status: DC
  Administered 2013-09-06: 2 [IU] via SUBCUTANEOUS
  Administered 2013-09-06: 3 [IU] via SUBCUTANEOUS
  Administered 2013-09-06: 1 [IU] via SUBCUTANEOUS
  Administered 2013-09-07: 2 [IU] via SUBCUTANEOUS
  Administered 2013-09-07: 1 [IU] via SUBCUTANEOUS
  Administered 2013-09-08: 3 [IU] via SUBCUTANEOUS
  Administered 2013-09-08: 1 [IU] via SUBCUTANEOUS

## 2013-09-05 MED ORDER — SODIUM CHLORIDE 0.9 % IV BOLUS (SEPSIS)
500.0000 mL | Freq: Once | INTRAVENOUS | Status: AC
  Administered 2013-09-05: 500 mL via INTRAVENOUS

## 2013-09-05 MED ORDER — VITAMIN C 500 MG PO TABS
500.0000 mg | ORAL_TABLET | Freq: Every day | ORAL | Status: DC
  Administered 2013-09-06 – 2013-09-08 (×3): 500 mg via ORAL
  Filled 2013-09-05 (×3): qty 1

## 2013-09-05 MED ORDER — ONDANSETRON HCL 4 MG/2ML IJ SOLN
4.0000 mg | INTRAMUSCULAR | Status: DC | PRN
  Administered 2013-09-05: 4 mg via INTRAVENOUS
  Filled 2013-09-05: qty 2

## 2013-09-05 MED ORDER — ADULT MULTIVITAMIN W/MINERALS CH
1.0000 | ORAL_TABLET | Freq: Every day | ORAL | Status: DC
  Administered 2013-09-06 – 2013-09-08 (×3): 1 via ORAL
  Filled 2013-09-05 (×3): qty 1

## 2013-09-05 MED ORDER — MESALAMINE ER 250 MG PO CPCR
ORAL_CAPSULE | ORAL | Status: AC
  Filled 2013-09-05: qty 8

## 2013-09-05 MED ORDER — PANTOPRAZOLE SODIUM 40 MG PO TBEC
40.0000 mg | DELAYED_RELEASE_TABLET | Freq: Every day | ORAL | Status: DC
  Administered 2013-09-05 – 2013-09-08 (×4): 40 mg via ORAL
  Filled 2013-09-05 (×4): qty 1

## 2013-09-05 MED ORDER — PHYTONADIONE 5 MG PO TABS
ORAL_TABLET | ORAL | Status: AC
  Filled 2013-09-05: qty 1

## 2013-09-05 MED ORDER — MORPHINE SULFATE 2 MG/ML IJ SOLN: 2.0000 mg | INTRAMUSCULAR | Status: DC | PRN

## 2013-09-05 MED ORDER — SODIUM CHLORIDE 0.9 % IJ SOLN
3.0000 mL | Freq: Two times a day (BID) | INTRAMUSCULAR | Status: DC
  Administered 2013-09-05 – 2013-09-08 (×5): 3 mL via INTRAVENOUS

## 2013-09-05 MED ORDER — IOHEXOL 300 MG/ML  SOLN
100.0000 mL | Freq: Once | INTRAMUSCULAR | Status: AC | PRN
  Administered 2013-09-05: 100 mL via INTRAVENOUS

## 2013-09-05 MED ORDER — INSULIN ASPART 100 UNIT/ML ~~LOC~~ SOLN: 0.0000 [IU] | Freq: Every day | SUBCUTANEOUS | Status: DC

## 2013-09-05 MED ORDER — DILTIAZEM HCL ER COATED BEADS 180 MG PO CP24
180.0000 mg | ORAL_CAPSULE | Freq: Every day | ORAL | Status: DC
  Administered 2013-09-06 – 2013-09-08 (×3): 180 mg via ORAL
  Filled 2013-09-05 (×3): qty 1

## 2013-09-05 MED ORDER — FAMOTIDINE IN NACL 20-0.9 MG/50ML-% IV SOLN
20.0000 mg | Freq: Once | INTRAVENOUS | Status: AC
  Administered 2013-09-05: 20 mg via INTRAVENOUS
  Filled 2013-09-05: qty 50

## 2013-09-05 MED ORDER — MESALAMINE ER 250 MG PO CPCR
1000.0000 mg | ORAL_CAPSULE | Freq: Three times a day (TID) | ORAL | Status: DC
  Administered 2013-09-05 – 2013-09-08 (×9): 1000 mg via ORAL
  Filled 2013-09-05 (×18): qty 4

## 2013-09-05 MED ORDER — ACETAMINOPHEN 500 MG PO TABS: 500.0000 mg | ORAL_TABLET | Freq: Three times a day (TID) | ORAL | Status: DC | PRN

## 2013-09-05 MED ORDER — SODIUM CHLORIDE 0.9 % IV SOLN
INTRAVENOUS | Status: AC
Start: 2013-09-05 — End: 2013-09-06
  Administered 2013-09-06: 05:00:00 via INTRAVENOUS

## 2013-09-05 MED ORDER — PHYTONADIONE 5 MG PO TABS
5.0000 mg | ORAL_TABLET | Freq: Once | ORAL | Status: AC
  Administered 2013-09-05: 5 mg via ORAL
  Filled 2013-09-05: qty 1

## 2013-09-05 MED ORDER — BOOST / RESOURCE BREEZE PO LIQD
1.0000 | Freq: Three times a day (TID) | ORAL | Status: DC
  Administered 2013-09-05 – 2013-09-08 (×6): 1 via ORAL

## 2013-09-05 MED ORDER — SODIUM CHLORIDE 0.9 % IV SOLN
INTRAVENOUS | Status: DC
  Administered 2013-09-05: 14:00:00 via INTRAVENOUS

## 2013-09-05 MED ORDER — SODIUM CHLORIDE 0.9 % IV SOLN: INTRAVENOUS | Status: AC

## 2013-09-05 MED ORDER — METHYLPREDNISOLONE SODIUM SUCC 40 MG IJ SOLR
30.0000 mg | Freq: Two times a day (BID) | INTRAMUSCULAR | Status: DC
  Administered 2013-09-05 – 2013-09-08 (×6): 30 mg via INTRAVENOUS
  Filled 2013-09-05 (×6): qty 1

## 2013-09-05 MED ORDER — HYDROCODONE-ACETAMINOPHEN 5-325 MG PO TABS
1.0000 | ORAL_TABLET | Freq: Four times a day (QID) | ORAL | Status: DC | PRN
  Administered 2013-09-05 – 2013-09-06 (×2): 2 via ORAL
  Filled 2013-09-05 (×3): qty 2

## 2013-09-05 MED ORDER — POLYETHYLENE GLYCOL 3350 17 G PO PACK: 17.0000 g | PACK | Freq: Every day | ORAL | Status: DC | PRN

## 2013-09-05 MED ORDER — ALPRAZOLAM 0.25 MG PO TABS
0.2500 mg | ORAL_TABLET | Freq: Every day | ORAL | Status: DC
  Administered 2013-09-06 – 2013-09-08 (×3): 0.5 mg via ORAL
  Filled 2013-09-05 (×3): qty 2

## 2013-09-05 NOTE — H&P (Signed)
[facility] History and Physical  [name] [id number] DOB: 08-12-1935 DOA: 09/05/2013  Referring physician:  Francine Graven PCP:  Redge Gainer, MD   Chief Complaint:  Abdominal pain  HPI:  The patient is a 78 y.o. year-old female with history of Crohn's disease on mesalamine and steroids taper after an admission a few weeks ago for partial bowel obstruction, paroxysmal atrial fibrillation on warfarin, chronic diastolic heart failure, hypertension, COPD, diabetes mellitus, non-alcoholic cirrhosis who presents with persistent abdominal pain.  The patient was admitted approximately 3 weeks ago with partial bowel obstruction for which she received antibiotics and IV steroids. She has been completing a long course of steroids in a tapering dose and is currently in 20 mg daily. She states when she was discharged from the hospital she had reasonable appetite, improved nausea and abdominal pain, however for the last few weeks, her epigastric pain has been 10 out of 10 and not relieved by anything except for pain medication.  No radiation, and she is not specific about the quality of her pain. She has frequent nausea that prevents her from eating with occasional nonbilious, nonbloody emesis. She has liquidy nonbloody stools approximately every 3 days.  She has been taking Gas-X and stool softeners such as MiraLax. She denies fevers, URI symptoms, dysuria or other symptoms of urinary tract infection.  In the emergency department, her labs were notable for a sodium of 126, chloride 87, glucose 142, mildly elevated bilirubin and AST/ALT. Her lactic acid was 4.6. CT scan of the abdomen and pelvis demonstrated stable findings of cirrhosis, persistent focal narrowing of the distal transverse colon and a nonobstructive bowel gas pattern with contrast in the rectum. There was also an incidentally noted persistent left ovarian cyst. She was given some IV fluids and is being admitted for pain and nausea  control.  Review of Systems:  General:  Denies fevers, chills, weight loss or gain HEENT:  Denies changes to hearing and vision, rhinorrhea, sinus congestion, sore throat CV:  Denies chest pain and palpitations, lower extremity edema.  PULM:  Denies SOB, wheezing, cough.   GI:  Per HPI GU:  Denies dysuria, frequency, urgency ENDO:  Denies polyuria, polydipsia HEME:  Denies hematemesis, blood in stools, melena, abnormal bruising or bleeding LYMPH:  Denies lymphadenopathy.   MSK:  Denies arthralgias, myalgias.   DERM:  Denies skin rash or ulcer.   NEURO:  Generalized weakness.  Denies focal numbness, weakness, slurred speech, confusion, facial droop.  PSYCH:  Denies anxiety and depression.    Past Medical History  Diagnosis Date  . Hypertension     x 5 years  . Paroxysmal atrial fibrillation   . Anxiety   . Crohn's disease   . 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 No clear documentation of this  . Diabetes mellitus   . Neuromuscular disorder     periph neuropathy  . Shortness of breath   . Arthritis   . GERD (gastroesophageal reflux disease)   . Blood transfusion   . COPD (chronic obstructive pulmonary disease)   . Cirrhosis     non aloholic   . Pleural effusion 02/13/2012  . RVH (right ventricular hypertrophy) 02/13/2012  . Anemia 02/13/2012  . Chronic respiratory failure 02/15/2012    With hypoxia and hypercapnia.  . Anasarca 01/2012    Ejection fraction 60-65%  . CHF (congestive heart failure)   . Small bowel obstruction   . Umbilical hernia    Past Surgical History  Procedure Laterality Date  . Basal cell carcinoma excision    . Abdominal surgery      multiple  . Vesicovaginal fistula closure w/ tah    . Cancers resected    . Appendectomy    . Abdominal hysterectomy    . Tubal ligation    . Cholecystectomy    . Colon surgery      hx bleeding ulcer  . Mass excision  06/23/2011    Procedure: EXCISION MASS;  Surgeon: Tennis Must, MD;   Location: [facility];  Service: Orthopedics;  Laterality: Left;  left hand excision mass with acell placement  . Left hand  06/23/11    Removed Squamous cell , and also did graft  . Mass excision  11/02/2011    Procedure: EXCISION MASS;  Surgeon: Tennis Must, MD;  Location: [facility];  Service: Orthopedics;  Laterality: Left;  EXCISION MASS LEFT HAND with full thickness skin graft from left upper arm  . Colonoscopy with esophagogastroduodenoscopy (egd) N/A 08/01/2012    Procedure: COLONOSCOPY WITH ESOPHAGOGASTRODUODENOSCOPY (EGD);  Surgeon: Rogene Houston, MD;  [facility];  Service: Endoscopy;  Laterality: N/A;  730  . Balloon dilation N/A 08/01/2012    Procedure: BALLOON DILATION;  Surgeon: Rogene Houston, MD;  [facility];  Service: Endoscopy;  Laterality: N/A;  Venia Minks dilation N/A 08/01/2012    Procedure: Venia Minks DILATION;  Surgeon: Rogene Houston, MD;  [facility];  Service: Endoscopy;  Laterality: N/A;  . Savory dilation N/A 08/01/2012    Procedure: SAVORY DILATION;  Surgeon: Rogene Houston, MD;  [facility];  Service: Endoscopy;  Laterality: N/A;   Social History:  reports that she has been smoking Cigarettes.  She started smoking about 50 years ago. She has a 8 pack-year smoking history. She has never used smokeless tobacco. She reports that she does not drink alcohol or use illicit drugs. Lives by herself and her daughter lives behind her.  Uses a cane or walker.  Family helps provide foods.  Has some home health services.    Allergies  Allergen Reactions  . Sulfonamide Derivatives Other (See Comments)    Felt funny    Family History  Problem Relation Age of Onset  . Coronary artery disease Father   . Heart attack Father   . Coronary artery disease Mother   . Coronary artery disease Brother   . Heart attack Brother   . Pancreatic cancer Brother   . Heart attack Brother   . Leukemia Daughter   .  Healthy Daughter   . Colon cancer Neg Hx      Prior to Admission medications   Medication Sig Start Date End Date Taking? Authorizing Provider  acetaminophen (TYLENOL) 500 MG tablet Take 500 mg by mouth every 8 (eight) hours as needed. Pain   Yes Historical Provider, MD  ALPRAZolam (XANAX) 0.5 MG tablet Take 0.25-0.5 mg by mouth daily.   Yes Historical Provider, MD  diltiazem (CARDIZEM CD) 180 MG 24 hr capsule Take 180 mg by mouth daily.   Yes Historical Provider, MD  furosemide (LASIX) 20 MG tablet Take 20 mg by mouth daily.   Yes Historical Provider, MD  gabapentin (NEURONTIN) 100 MG capsule Take 100 mg by mouth at bedtime.   Yes Historical Provider, MD  HYDROcodone-acetaminophen [facility]) 5-325 MG per tablet 1-2 tabs po q6 hours prn pain 08/14/13  Yes Lysbeth Penner, FNP  lansoprazole (PREVACID) 30  MG capsule Take 30 mg by mouth daily as needed (for acid reflux).    Yes Historical Provider, MD  mesalamine (PENTASA) 500 MG CR capsule Take 2 capsules (1,000 mg total) by mouth 3 (three) times daily. 03/28/13  Yes Rogene Houston, MD  metFORMIN (GLUCOPHAGE) 1000 MG tablet Take 1,000 mg by mouth 2 (two) times daily with a meal.   Yes Historical Provider, MD  Multiple Vitamin (MULTIVITAMIN) tablet Take 1 tablet by mouth daily.     Yes Historical Provider, MD  polyethylene glycol (MIRALAX) packet Take 17 g by mouth daily as needed for mild constipation. 08/19/13  Yes Samuella Cota, MD  potassium chloride (K-DUR) 10 MEQ tablet Take 10 mEq by mouth every morning.   Yes Historical Provider, MD  predniSONE (DELTASONE) 5 MG tablet Take 30 mg by mouth daily for 7 days, then take 25 mg by mouth daily for 7 days, then take 20 mg by mouth for 7 days, then take 15 mg by mouth daily for 7 days, then take 10 mg by mouth daily for 7 days, then take 5 mg by mouth daily for 7 days and then stop. 08/19/13  Yes Samuella Cota, MD  spironolactone (ALDACTONE) 50 MG tablet Take 50 mg by mouth 2 (two) times daily.    Yes Historical Provider, MD  vitamin C (ASCORBIC ACID) 500 MG tablet Take 500 mg by mouth daily.   Yes Historical Provider, MD  Vitamin D, Ergocalciferol, (DRISDOL) 50000 UNITS CAPS capsule Take 1 capsule (50,000 Units total) by mouth every 7 (seven) days. 08/31/13  Yes Chipper Herb, MD  warfarin (COUMADIN) 1 MG tablet Take 0.5-1 mg by mouth daily. Patient takes 1mg  on Tues.and Thurs. And 0.5mg  all other days   Yes Historical Provider, MD  ferrous sulfate 325 (65 FE) MG tablet Take 325 mg by mouth. Patient takes this medication 3 times per week    Historical Provider, MD   Physical Exam: Filed Vitals:   09/05/13 1226 09/05/13 1436 09/05/13 1437 09/05/13 1438  BP: 134/64 117/56 117/55 116/54  Pulse: 96 85 89 90  Temp: 98 F (36.7 C)     TempSrc: Oral     Resp: 16     Height: 4\' 11"  (1.499 m)     Weight: 78.472 kg (173 lb)     SpO2: 100%        General:  Caucasian female, no acute distress  Eyes:  PERRL, anicteric, non-injected.  ENT:  Nares clear.  OP clear, non-erythematous without plaques or exudates.  MMM.  Neck:  Supple without TM or JVD.    Lymph:  No cervical, supraclavicular, or submandibular LAD.    Cardiovascular:  IRRR, normal S1, S2, without m/r/g.  2+ pulses, warm extremities  Respiratory:  CTA bilaterally without increased WOB.  Abdomen:  High pitched BS.  Soft, mildly distended, mildly tender to palpation in epigastrium without rebound or guarding  Skin:  No rashes or focal lesions.  Musculoskeletal:  Normal bulk and tone.  1+ bilateral LE edema  Psychiatric:  A & O x 4.  Appropriate affect.  Neurologic:  CN 3-12 intact.  5/5 strength.  Sensation intact.  Labs on Admission:  Basic Metabolic Panel:  Recent Labs Lab 09/05/13 1334  NA 126*  K 5.0  CL 87*  CO2 25  GLUCOSE 142*  BUN 19  CREATININE 1.02  CALCIUM 10.1   Liver Function Tests:  Recent Labs Lab 09/05/13 1334  AST 39*  ALT 47*  ALKPHOS  82  BILITOT 2.1*  PROT 7.1  ALBUMIN 3.0*     Recent Labs Lab 09/05/13 1334  LIPASE 54   No results found for this basename: AMMONIA,  in the last 168 hours CBC:  Recent Labs Lab 09/05/13 1334  WBC 8.9  NEUTROABS 8.1*  HGB 11.6*  HCT 36.3  MCV 79.4  PLT 164   Cardiac Enzymes:  Recent Labs Lab 09/05/13 1334  TROPONINI <0.30    BNP (last 3 results) No results found for this basename: PROBNP,  in the last 8760 hours CBG: No results found for this basename: GLUCAP,  in the last 168 hours  Radiological Exams on Admission: Dg Chest 2 View  09/05/2013   CLINICAL DATA:  Epigastric pain  EXAM: CHEST  2 VIEW  COMPARISON:  08/08/2013  FINDINGS: Cardiomediastinal silhouette is stable. No acute infiltrate or pleural effusion. No pulmonary edema. Mild degenerative changes thoracic spine.  IMPRESSION: No active cardiopulmonary disease.   Electronically Signed   By: Lahoma Crocker M.D.   On: 09/05/2013 15:37   Ct Abdomen Pelvis W Contrast  09/05/2013   CLINICAL DATA:  Abdominal pain, third CT scan in 4 weeks (ordering physician aware), history of crohn's disease, CHF, cirrhosis  EXAM: CT ABDOMEN AND PELVIS WITH CONTRAST  TECHNIQUE: Multidetector CT imaging of the abdomen and pelvis was performed using the standard protocol following bolus administration of intravenous contrast.  CONTRAST:  164mL OMNIPAQUE IOHEXOL 300 MG/ML  SOLN  COMPARISON:  CT ABD - PELV W/ CM dated 08/17/2013; CT ABD - PELV W/ CM dated 08/08/2013  FINDINGS: Small right and minimal left pleural effusions similar to prior study.  Small nodular liver consistent with cirrhosis. Spleen is normal. Splenorenal varices throughout the left flank again identified as well as esophageal and anterior body wall varices. Focal dilatation left renal vein in the region of the left renal hilum, to 3.2 cm, stable. Kidneys normal. Adrenal glands normal. Pancreas head 1mm calcification stable.  Numerous surgical clips seen throughout the mid abdomen and left flank, unchanged. Numerous enlarged  mesenteric lymph nodes in the midline, measuring up to 14 mm in Lyliana Dicenso axis, stable. Despite the presence of focal narrowing involving the distal transverse colon, which is stable, there is a nonobstructive bowel gas pattern.  There is a small volume of ascites in the abdomen and pelvis. There is evidence of anasarca  The bladder is normal. In the left adnexa there is a 38 x 40 mm cystic lesion, unchanged from prior study.  There are no acute musculoskeletal findings.  IMPRESSION: 1. Stable findings of cirrhosis with varices, ascites, and pleural effusions 2. Persistent focal narrowing distal transverse colon possibly related to Crohn's disease. Malignancy not entirely excluded. Nonobstructive bowel gas pattern with contrast into the rectum. 3. Persistent left ovarian cyst, malignancy not excluded.   Electronically Signed   By: Skipper Cliche M.D.   On: 09/05/2013 15:53    EKG: Independently reviewed. Diminished amplitude, a-fib  Assessment/Plan Active Problems:   DM   OVERWEIGHT/OBESITY   ANXIETY   HYPERTENSION, UNSPECIFIED   Atrial fibrillation   CROHN'S DISEASE   Tobacco abuse   Chronic respiratory failure   Abdominal pain   Hyponatremia  ---  Abdominal pain with nausea and occasional emesis, no evidence of complete bowel obstruction although she does have some stenosis of the transverse colon noted on CT.  She may need a dilation of the stricture to improve her symptoms.  Her mildly elevated liver function tests are probably secondary to  some relative dehydration. -  Case discussed with Dr. Laural Golden who will see the patient in the morning -  Start Solu-Medrol 30 mg IV twice a day -  No antibiotics at this time  -  Clear liquid diet -  Morphine when necessary -  Anti-emetics -  IV fluids, use judiciously secondary to chronic diastolic heart failure  Elevated lactic acid, likely reflects relative dehydration.  No evidence of ischemia on CT scan of the abdomen -  Judicious IV fluids and  hold diuretics -  Repeat in AM  Atrial fibrillation, currently anticoagulated on warfarin -  Check INR -  Hold Coumadin and give vitamin K in preparation for procedure in the next few days -  Rate controlled  Chronic diastolic heart failure with chronic effusions in lower extremity edema, swelling and weight are both down from baseline -  Judicious use of IV fluids -  Hold lasix, spironolactone, and potassium pending improvement in lactic acid  Diabetes mellitus, increasing dose of steroids may require increased insulin -  Hold metformin -  Start low-dose sliding scale insulin  Anxiety, stable. Continue Xanax and gabapentin  Hyponatremia and hypochloremia due to GI losses/tea/toast diet/dehydration -  IVF and repeat BMP in AM  Iron deficiency anemia -  Hold oral iron due to nausea  Generalized weakness, PT/OT consults once feeling better  Moderate to severe protein calorie malnutrition as evidenced by weight loss over the last month  -  Advance diet as tolerated and add supplements  -  Nutrition consultation   Diet:  CLD Access:  PIV IVF:  yes Proph:  warfarin  Code Status: Full Family Communication: patient and daughter Disposition Plan: Admit to telemetry  Time spent: 60 min Janece Canterbury [facility] Pager [phone]  If 7PM-7AM, please contact night-coverage [email or web address] Password [email or web address] 09/05/2013, 5:26 PM

## 2013-09-05 NOTE — Telephone Encounter (Signed)
Tonya from [facility] called to let us know that she spoke with patient today and advised that she would be coming out to check her PT/INR. Patient advised that she was having abdominal pain (which is a chronic complaint for her and she sees Dr Shonna Chock) and nurse told her to wait until she got there and she would fully evaluate her. Nurse states that she went to see patient and she was not at home. Patient is also not answering her phone. Nurse called all local ERs including [facility] which is where she usually goes and has not been able to locate her. Just wanted [place] to know that she was unable to check her PT/INR

## 2013-09-05 NOTE — ED Provider Notes (Signed)
CSN: [id number]     Arrival date & time 09/05/13  1204 History   First MD Initiated Contact with Patient 09/05/13 1310     Chief Complaint  Patient presents with  . Abdominal Pain     HPI Pt was seen at 1320.  Per pt, c/o gradual onset and worsening of persistent generalized abd "pain" for the past 1 month, worse over the past 2 weeks.  Has been associated with nausea, poor PO intake and generalized weakness/fatigue.  Describes the abd pain as "aching, "cramping," and "burning." Pt was admitted to the hospital last month for similar symptoms; dx Crohn's flair and partial SBO.  Denies vomiting/diarrhea, no fevers, no back pain, no rash, no CP/SOB, no black or blood in stools.       Past Medical History  Diagnosis Date  . Hypertension     x 5 years  . Paroxysmal atrial fibrillation   . Anxiety   . Crohn's disease   . 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 No clear documentation of this  . Diabetes mellitus   . Neuromuscular disorder     periph neuropathy  . Shortness of breath   . Arthritis   . GERD (gastroesophageal reflux disease)   . Blood transfusion   . COPD (chronic obstructive pulmonary disease)   . Cirrhosis     non aloholic   . Pleural effusion 02/13/2012  . RVH (right ventricular hypertrophy) 02/13/2012  . Anemia 02/13/2012  . Chronic respiratory failure 02/15/2012    With hypoxia and hypercapnia.  . Anasarca 01/2012    Ejection fraction 60-65%  . CHF (congestive heart failure)   . Small bowel obstruction    Past Surgical History  Procedure Laterality Date  . Basal cell carcinoma excision    . Abdominal surgery      multiple  . Vesicovaginal fistula closure w/ tah    . Cancers resected    . Appendectomy    . Abdominal hysterectomy    . Tubal ligation    . Cholecystectomy    . Colon surgery      hx bleeding ulcer  . Mass excision  06/23/2011    Procedure: EXCISION MASS;  Surgeon: Tennis Must, MD;  Location: [place];   Service: Orthopedics;  Laterality: Left;  left hand excision mass with acell placement  . Left hand  06/23/11    Removed Squamous cell , and also did graft  . Mass excision  11/02/2011    Procedure: EXCISION MASS;  Surgeon: Tennis Must, MD;  Location: [place];  Service: Orthopedics;  Laterality: Left;  EXCISION MASS LEFT HAND with full thickness skin graft from left upper arm  . Colonoscopy with esophagogastroduodenoscopy (egd) N/A 08/01/2012    Procedure: COLONOSCOPY WITH ESOPHAGOGASTRODUODENOSCOPY (EGD);  Surgeon: Rogene Houston, MD;  [facility];  Service: Endoscopy;  Laterality: N/A;  730  . Balloon dilation N/A 08/01/2012    Procedure: BALLOON DILATION;  Surgeon: Rogene Houston, MD;  [facility];  Service: Endoscopy;  Laterality: N/A;  Venia Minks dilation N/A 08/01/2012    Procedure: Venia Minks DILATION;  Surgeon: Rogene Houston, MD;  [facility];  Service: Endoscopy;  Laterality: N/A;  . Savory dilation N/A 08/01/2012    Procedure: SAVORY DILATION;  Surgeon: Rogene Houston, MD;  [facility];  Service: Endoscopy;  Laterality: N/A;   Family History  Problem Relation Age of Onset  .  Coronary artery disease Father   . Heart attack Father   . Coronary artery disease Mother   . Coronary artery disease Brother   . Heart attack Brother   . Pancreatic cancer Brother   . Heart attack Brother   . Leukemia Daughter   . Healthy Daughter   . Colon cancer Neg Hx    History  Substance Use Topics  . Smoking status: Current Every Day Smoker -- 0.50 packs/day for 40 years    Types: Cigarettes    Start date: 06/02/1963  . Smokeless tobacco: Never Used  . Alcohol Use: No    Review of Systems ROS: Statement: All systems negative except as marked or noted in the HPI; Constitutional: Negative for fever and chills. +generalized weakness/fatigue.; ; Eyes: Negative for eye pain, redness and discharge. ; ; ENMT: Negative for ear pain,  hoarseness, nasal congestion, sinus pressure and sore throat. ; ; Cardiovascular: Negative for chest pain, palpitations, diaphoresis, dyspnea and peripheral edema. ; ; Respiratory: Negative for cough, wheezing and stridor. ; ; Gastrointestinal: +nausea, abd pain. Negative for vomiting, diarrhea, blood in stool, hematemesis, jaundice and rectal bleeding. . ; ; Genitourinary: Negative for dysuria, flank pain and hematuria. ; ; Musculoskeletal: Negative for back pain and neck pain. Negative for swelling and trauma.; ; Skin: Negative for pruritus, rash, abrasions, blisters, bruising and skin lesion.; ; Neuro: Negative for headache, lightheadedness and neck stiffness. Negative for altered level of consciousness , altered mental status, extremity weakness, paresthesias, involuntary movement, seizure and syncope.     Allergies  Sulfonamide derivatives  Home Medications   Current Outpatient Rx  Name  Route  Sig  Dispense  Refill  . acetaminophen (TYLENOL) 500 MG tablet   Oral   Take 500 mg by mouth every 8 (eight) hours as needed. Pain         . ALPRAZolam (XANAX) 0.5 MG tablet   Oral   Take 0.25-0.5 mg by mouth daily.         Marland Kitchen diltiazem (CARDIZEM CD) 180 MG 24 hr capsule   Oral   Take 180 mg by mouth daily.         . furosemide (LASIX) 20 MG tablet   Oral   Take 20 mg by mouth daily.         Marland Kitchen gabapentin (NEURONTIN) 100 MG capsule   Oral   Take 100 mg by mouth at bedtime.         Marland Kitchen HYDROcodone-acetaminophen (NORCO) 5-325 MG per tablet      1-2 tabs po q6 hours prn pain   40 tablet   0   . lansoprazole (PREVACID) 30 MG capsule   Oral   Take 30 mg by mouth daily as needed (for acid reflux).          . mesalamine (PENTASA) 500 MG CR capsule   Oral   Take 2 capsules (1,000 mg total) by mouth 3 (three) times daily.   180 capsule   2   . metFORMIN (GLUCOPHAGE) 1000 MG tablet   Oral   Take 1,000 mg by mouth 2 (two) times daily with a meal.         . Multiple Vitamin  (MULTIVITAMIN) tablet   Oral   Take 1 tablet by mouth daily.           . polyethylene glycol (MIRALAX) packet   Oral   Take 17 g by mouth daily as needed for mild constipation.         Marland Kitchen  potassium chloride (K-DUR) 10 MEQ tablet   Oral   Take 10 mEq by mouth every morning.         . predniSONE (DELTASONE) 5 MG tablet      Take 30 mg by mouth daily for 7 days, then take 25 mg by mouth daily for 7 days, then take 20 mg by mouth for 7 days, then take 15 mg by mouth daily for 7 days, then take 10 mg by mouth daily for 7 days, then take 5 mg by mouth daily for 7 days and then stop.   142 tablet   0   . spironolactone (ALDACTONE) 50 MG tablet   Oral   Take 50 mg by mouth 2 (two) times daily.         . vitamin C (ASCORBIC ACID) 500 MG tablet   Oral   Take 500 mg by mouth daily.         . Vitamin D, Ergocalciferol, (DRISDOL) 50000 UNITS CAPS capsule   Oral   Take 1 capsule (50,000 Units total) by mouth every 7 (seven) days.   30 capsule   0   . warfarin (COUMADIN) 1 MG tablet   Oral   Take 0.5-1 mg by mouth daily. Patient takes 1mg  on Tues.and Thurs. And 0.5mg  all other days         . ferrous sulfate 325 (65 FE) MG tablet   Oral   Take 325 mg by mouth. Patient takes this medication 3 times per week          BP 134/64  Pulse 96  Temp(Src) 98 F (36.7 C) (Oral)  Resp 16  Ht 4\' 11"  (1.499 m)  Wt 173 lb (78.472 kg)  BMI 34.92 kg/m2  SpO2 100% Physical Exam 1325: Physical examination:  Nursing notes reviewed; Vital signs and O2 SAT reviewed;  Constitutional: Well developed, Well nourished, In no acute distress; Head:  Normocephalic, atraumatic; Eyes: EOMI, PERRL, No scleral icterus; ENMT: Mouth and pharynx normal, Mucous membranes dry; Neck: Supple, Full range of motion, No lymphadenopathy; Cardiovascular: Irregular irregular rate and rhythm, No gallop; Respiratory: Breath sounds clear & equal bilaterally, No rales, rhonchi, wheezes.  Speaking full sentences with  ease, Normal respiratory effort/excursion; Chest: Nontender, Movement normal; Abdomen: Soft, +mild diffuse tenderness to palp. No rebound or guarding. Nondistended, Normal bowel sounds; Genitourinary: No CVA tenderness; Extremities: Pulses normal, No tenderness, +2 pedal edema bilat without calf asymmetry.; Neuro: AA&Ox3, Major CN grossly intact.  Speech clear. No gross focal motor or sensory deficits in extremities.; Skin: Color normal, Warm, Dry.   ED Course  Procedures     EKG Interpretation   Date/Time:  Tuesday September 05 2013 14:30:12 EDT Ventricular Rate:  86 PR Interval:    QRS Duration: 68 QT Interval:  354 QTC Calculation: 423 R Axis:   -14 Text Interpretation:  Atrial fibrillation Low voltage QRS Cannot rule out  Anterior infarct (cited on or before 28-Aug-2013) Abnormal ECG When  compared with ECG of 28-Aug-2013 16:45, No significant change was found  Confirmed by [facility]  MD, Nunzio Cory [phone]) on 09/05/2013 2:35:02 PM      MDM  MDM Reviewed: previous chart, nursing note and vitals Reviewed previous: labs, x-ray, CT scan and ECG Interpretation: labs, x-ray, CT scan and ECG Total time providing critical care: 30-74 minutes. This excludes time spent performing separately reportable procedures and services. Consults: admitting MD   CRITICAL CARE Performed by: Alfonzo Feller Total critical care time: 35 Critical care time was exclusive  of separately billable procedures and treating other patients. Critical care was necessary to treat or prevent imminent or life-threatening deterioration. Critical care was time spent personally by me on the following activities: development of treatment plan with patient and/or surrogate as well as nursing, discussions with consultants, evaluation of patient's response to treatment, examination of patient, obtaining history from patient or surrogate, ordering and performing treatments and interventions, ordering and review of laboratory  studies, ordering and review of radiographic studies, pulse oximetry and re-evaluation of patient's condition.    Results for orders placed during the hospital encounter of 09/05/13  URINALYSIS, ROUTINE W REFLEX MICROSCOPIC      Result Value Ref Range   Color, Urine YELLOW  YELLOW   APPearance CLEAR  CLEAR   Specific Gravity, Urine 1.005  1.005 - 1.030   pH 5.5  5.0 - 8.0   Glucose, UA NEGATIVE  NEGATIVE mg/dL   Hgb urine dipstick NEGATIVE  NEGATIVE   Bilirubin Urine NEGATIVE  NEGATIVE   Ketones, ur NEGATIVE  NEGATIVE mg/dL   Protein, ur NEGATIVE  NEGATIVE mg/dL   Urobilinogen, UA 0.2  0.0 - 1.0 mg/dL   Nitrite NEGATIVE  NEGATIVE   Leukocytes, UA NEGATIVE  NEGATIVE  CBC WITH DIFFERENTIAL      Result Value Ref Range   WBC 8.9  4.0 - 10.5 K/uL   RBC 4.57  3.87 - 5.11 MIL/uL   Hemoglobin 11.6 (*) 12.0 - 15.0 g/dL   HCT 36.3  36.0 - 46.0 %   MCV 79.4  78.0 - 100.0 fL   MCH 25.4 (*) 26.0 - 34.0 pg   MCHC 32.0  30.0 - 36.0 g/dL   RDW 18.9 (*) 11.5 - 15.5 %   Platelets 164  150 - 400 K/uL   Neutrophils Relative % 91 (*) 43 - 77 %   Neutro Abs 8.1 (*) 1.7 - 7.7 K/uL   Lymphocytes Relative 6 (*) 12 - 46 %   Lymphs Abs 0.6 (*) 0.7 - 4.0 K/uL   Monocytes Relative 3  3 - 12 %   Monocytes Absolute 0.3  0.1 - 1.0 K/uL   Eosinophils Relative 0  0 - 5 %   Eosinophils Absolute 0.0  0.0 - 0.7 K/uL   Basophils Relative 0  0 - 1 %   Basophils Absolute 0.0  0.0 - 0.1 K/uL  COMPREHENSIVE METABOLIC PANEL      Result Value Ref Range   Sodium 126 (*) 137 - 147 mEq/L   Potassium 5.0  3.7 - 5.3 mEq/L   Chloride 87 (*) 96 - 112 mEq/L   CO2 25  19 - 32 mEq/L   Glucose, Bld 142 (*) 70 - 99 mg/dL   BUN 19  6 - 23 mg/dL   Creatinine, Ser 1.02  0.50 - 1.10 mg/dL   Calcium 10.1  8.4 - 10.5 mg/dL   Total Protein 7.1  6.0 - 8.3 g/dL   Albumin 3.0 (*) 3.5 - 5.2 g/dL   AST 39 (*) 0 - 37 U/L   ALT 47 (*) 0 - 35 U/L   Alkaline Phosphatase 82  39 - 117 U/L   Total Bilirubin 2.1 (*) 0.3 - 1.2 mg/dL    GFR calc non Af Amer 52 (*) >90 mL/min   GFR calc Af Amer 60 (*) >90 mL/min  LIPASE, BLOOD      Result Value Ref Range   Lipase 54  11 - 59 U/L  LACTIC ACID, PLASMA  Result Value Ref Range   Lactic Acid, Venous 4.6 (*) 0.5 - 2.2 mmol/L  TROPONIN I      Result Value Ref Range   Troponin I <0.30  <0.30 ng/mL   Dg Chest 2 View 09/05/2013   CLINICAL DATA:  Epigastric pain  EXAM: CHEST  2 VIEW  COMPARISON:  08/08/2013  FINDINGS: Cardiomediastinal silhouette is stable. No acute infiltrate or pleural effusion. No pulmonary edema. Mild degenerative changes thoracic spine.  IMPRESSION: No active cardiopulmonary disease.   Electronically Signed   By: Lahoma Crocker M.D.   On: 09/05/2013 15:37   Ct Abdomen Pelvis W Contrast 09/05/2013   CLINICAL DATA:  Abdominal pain, third CT scan in 4 weeks (ordering physician aware), history of crohn's disease, CHF, cirrhosis  EXAM: CT ABDOMEN AND PELVIS WITH CONTRAST  TECHNIQUE: Multidetector CT imaging of the abdomen and pelvis was performed using the standard protocol following bolus administration of intravenous contrast.  CONTRAST:  140mL OMNIPAQUE IOHEXOL 300 MG/ML  SOLN  COMPARISON:  CT ABD - PELV W/ CM dated 08/17/2013; CT ABD - PELV W/ CM dated 08/08/2013  FINDINGS: Small right and minimal left pleural effusions similar to prior study.  Small nodular liver consistent with cirrhosis. Spleen is normal. Splenorenal varices throughout the left flank again identified as well as esophageal and anterior body wall varices. Focal dilatation left renal vein in the region of the left renal hilum, to 3.2 cm, stable. Kidneys normal. Adrenal glands normal. Pancreas head 15mm calcification stable.  Numerous surgical clips seen throughout the mid abdomen and left flank, unchanged. Numerous enlarged mesenteric lymph nodes in the midline, measuring up to 14 mm in short axis, stable. Despite the presence of focal narrowing involving the distal transverse colon, which is stable, there is a  nonobstructive bowel gas pattern.  There is a small volume of ascites in the abdomen and pelvis. There is evidence of anasarca  The bladder is normal. In the left adnexa there is a 38 x 40 mm cystic lesion, unchanged from prior study.  There are no acute musculoskeletal findings.  IMPRESSION: 1. Stable findings of cirrhosis with varices, ascites, and pleural effusions 2. Persistent focal narrowing distal transverse colon possibly related to Crohn's disease. Malignancy not entirely excluded. Nonobstructive bowel gas pattern with contrast into the rectum. 3. Persistent left ovarian cyst, malignancy not excluded.   Electronically Signed   By: Skipper Cliche M.D.   On: 09/05/2013 15:53    1630:  LFT's near baseline. New hyponatremia on today's labs, as well as lactic acid elevated; will continue judicious IVF.  Dx and testing d/w pt and family.  Questions answered.  Verb understanding, agreeable to admit.  T/C to [facility] Dr. Sheran Fava, case discussed, including:  HPI, pertinent PM/SHx, VS/PE, dx testing, ED course and treatment:  Agreeable to admit, requests to write temporary orders, obtain tele bed to team 2.    Alfonzo Feller, DO 09/07/13 Johnnye Lana

## 2013-09-05 NOTE — ED Notes (Signed)
Intermittent epigastric pain x 2 wks.  Seen here for same in past.  Denies n/v/d.

## 2013-09-06 DIAGNOSIS — E872 Acidosis, unspecified: Secondary | ICD-10-CM

## 2013-09-06 DIAGNOSIS — E875 Hyperkalemia: Secondary | ICD-10-CM

## 2013-09-06 DIAGNOSIS — E43 Unspecified severe protein-calorie malnutrition: Secondary | ICD-10-CM | POA: Insufficient documentation

## 2013-09-06 LAB — CBC
HCT: 28.9 % — ABNORMAL LOW (ref 36.0–46.0)
HEMOGLOBIN: 9.2 g/dL — AB (ref 12.0–15.0)
MCH: 25.4 pg — ABNORMAL LOW (ref 26.0–34.0)
MCHC: 31.8 g/dL (ref 30.0–36.0)
MCV: 79.8 fL (ref 78.0–100.0)
Platelets: 128 10*3/uL — ABNORMAL LOW (ref 150–400)
RBC: 3.62 MIL/uL — ABNORMAL LOW (ref 3.87–5.11)
RDW: 18.8 % — AB (ref 11.5–15.5)
WBC: 5.9 10*3/uL (ref 4.0–10.5)

## 2013-09-06 LAB — URINE CULTURE: Colony Count: >=100000

## 2013-09-06 LAB — PROTIME-INR
INR: 2.21 — ABNORMAL HIGH (ref 0.00–1.49)
PROTHROMBIN TIME: 23.8 s — AB (ref 11.6–15.2)

## 2013-09-06 LAB — GLUCOSE, CAPILLARY
GLUCOSE-CAPILLARY: 134 mg/dL — AB (ref 70–99)
GLUCOSE-CAPILLARY: 154 mg/dL — AB (ref 70–99)
GLUCOSE-CAPILLARY: 213 mg/dL — AB (ref 70–99)
Glucose-Capillary: 174 mg/dL — ABNORMAL HIGH (ref 70–99)

## 2013-09-06 LAB — BASIC METABOLIC PANEL
BUN: 19 mg/dL (ref 6–23)
CO2: 29 mEq/L (ref 19–32)
Calcium: 9.6 mg/dL (ref 8.4–10.5)
Chloride: 94 mEq/L — ABNORMAL LOW (ref 96–112)
Creatinine, Ser: 1.03 mg/dL (ref 0.50–1.10)
GFR calc Af Amer: 60 mL/min — ABNORMAL LOW (ref >90)
GFR calc non Af Amer: 51 mL/min — ABNORMAL LOW (ref >90)
Glucose, Bld: 138 mg/dL — ABNORMAL HIGH (ref 70–99)
Potassium: 5.4 mEq/L — ABNORMAL HIGH (ref 3.7–5.3)
Sodium: 130 mEq/L — ABNORMAL LOW (ref 137–147)

## 2013-09-06 LAB — LACTIC ACID, PLASMA: Lactic Acid, Venous: 1.7 mmol/L (ref 0.5–2.2)

## 2013-09-06 MED ORDER — ONDANSETRON HCL 4 MG/2ML IJ SOLN
4.0000 mg | Freq: Four times a day (QID) | INTRAMUSCULAR | Status: DC | PRN
  Administered 2013-09-06 – 2013-09-08 (×2): 4 mg via INTRAVENOUS
  Filled 2013-09-06 (×2): qty 2

## 2013-09-06 MED ORDER — PHYTONADIONE 5 MG PO TABS
5.0000 mg | ORAL_TABLET | Freq: Once | ORAL | Status: AC
  Administered 2013-09-06: 5 mg via ORAL
  Filled 2013-09-06: qty 1

## 2013-09-06 NOTE — Progress Notes (Signed)
UR completed 

## 2013-09-06 NOTE — Progress Notes (Signed)
INITIAL NUTRITION ASSESSMENT  DOCUMENTATION CODES Per approved criteria  -Severe malnutrition in the context of acute illness or injury   Pt meets criteria for severe MALNUTRITION in the context of acute illness as evidenced by <50% estimated energy intake x 5 days, moderate fat and muscle depletion, 4.4% wt loss x 1 week.  INTERVENTION: Continue with current plan of care. Once diet is advanced, d/c Resource Breeze TID for Ensure Complete po BID, each supplement provides 350 kcal and 13 grams of protein, per pt preference.   NUTRITION DIAGNOSIS: Inadequate oral intake related to poor appetite as evidenced by 4.4% wt loss x 1 week.   Goal: Pt will meet >90% of estimated nutritional needs  Monitor:  Diet advancement, PO intake, labs, skin assessments, weight changes, I/O's  Reason for Assessment: MD consult to assess needs  78 y.o. female  Admitting Dx: <principal problem not specified>  ASSESSMENT: Pt admitted for abdominal pain. She reports hx of poor appetite for approximately 4-5 weeks, due to abdominal pain. She reports abdominal pain is worst at bedtime and pt reports that she feels like she is "living on pain pills", as they are the only method of pain relief in the evening.  She reports that she is tolerating clear liquid diet well, but it "runs right through her". She desires a diet upgrade and feels like she can tolerate it; she reveals that she voiced this concern with GI as well. She is currently receiving Resource Breeze po TID, each supplement provides 250 kcal and 9 grams of protein, which she reports is "okay" but would prefer Ensure upon diet advancement. She reports she has been drinking 1/2 bottle of Boost for the past few days PTA in attempt to increase calorie and protein intake.  Wt hx reveals a wt loss trend over the past year. Noted a 8.5% wt loss x 1 year, 6.6% wt loss x 6 months, and a 4.4% wt loss x 3 months and 1 month, none of which are clinically significant.  Documented wt hx also reveals a 4.4% wt loss x 1 week, which is clinically significant. Per pt report, she has lost 8-10# (approximtely 5.1%) in the past 2 weeks. She reports that she has lost weight intentionally over the past year in attempt to improve her glycemic control, as she has increased physical activity (walking and HHPT) as well as eating a healthier diet. She also attributes some distant weight loss to outpatient use of diuretics. Unintentional weight loss started approximately 2 weeks ago.   Nutrition Focused Physical Exam:  Subcutaneous Fat:  Orbital Region: moderate depletion Upper Arm Region: moderate depletion Thoracic and Lumbar Region: WDL  Muscle:  Temple Region: moderate depletion Clavicle Bone Region: WDL Clavicle and Acromion Bone Region: moderate depletion Scapular Bone Region: WDL Dorsal Hand: WDL Patellar Region: WDL Anterior Thigh Region: moderate depletion Posterior Calf Region: WDL  Edema: significant on lower legs and ankles   Height: Ht Readings from Last 1 Encounters:  09/05/13 4\' 11"  (1.499 m)    Weight: Wt Readings from Last 1 Encounters:  09/06/13 183 lb 3.2 oz (83.099 kg)    Ideal Body Weight: 98#  % Ideal Body Weight: 187%  Wt Readings from Last 10 Encounters:  09/06/13 183 lb 3.2 oz (83.099 kg)  08/28/13 191 lb (86.637 kg)  08/19/13 194 lb 12.8 oz (88.361 kg)  08/14/13 194 lb (87.998 kg)  08/09/13 191 lb 8 oz (86.864 kg)  06/28/13 195 lb (88.451 kg)  06/27/13 190 lb (86.183 kg)  05/30/13 191 lb (86.637 kg)  05/23/13 194 lb 12.8 oz (88.361 kg)  03/28/13 185 lb 6.4 oz (84.097 kg)    Usual Body Weight: 233#  % Usual Body Weight: 80%  BMI:  Body mass index is 36.98 kg/(m^2). Meets criteria for obesity, class II.   Estimated Nutritional Needs: Kcal: 1200-1400 daily Protein: 83-100 grams daily Fluid: 1.2-1.4 L daily  Skin: Intact  Diet Order: Clear Liquid  EDUCATION NEEDS: -Education needs addressed   Intake/Output  Summary (Last 24 hours) at 09/06/13 1246 Last data filed at 09/06/13 1023  Gross per 24 hour  Intake    480 ml  Output    400 ml  Net     80 ml    Last BM: 09/04/13  Labs:   Recent Labs Lab 09/05/13 1334 09/06/13 0641  NA 126* 130*  K 5.0 5.4*  CL 87* 94*  CO2 25 29  BUN 19 19  CREATININE 1.02 1.03  CALCIUM 10.1 9.6  GLUCOSE 142* 138*    CBG (last 3)   Recent Labs  09/05/13 2052 09/06/13 0752 09/06/13 1151  GLUCAP 163* 134* 154*    Scheduled Meds: . ALPRAZolam  0.25-0.5 mg Oral Daily  . diltiazem  180 mg Oral Daily  . feeding supplement (RESOURCE BREEZE)  1 Container Oral TID BM  . gabapentin  100 mg Oral QHS  . insulin aspart  0-5 Units Subcutaneous QHS  . insulin aspart  0-9 Units Subcutaneous TID WC  . mesalamine  1,000 mg Oral TID  . methylPREDNISolone (SOLU-MEDROL) injection  30 mg Intravenous Q12H  . multivitamin with minerals  1 tablet Oral Daily  . pantoprazole  40 mg Oral Daily  . sodium chloride  3 mL Intravenous Q12H  . vitamin C  500 mg Oral Daily    Continuous Infusions:   Past Medical History  Diagnosis Date  . Hypertension     x 5 years  . Paroxysmal atrial fibrillation   . Anxiety   . Crohn's disease   . 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 No clear documentation of this  . Diabetes mellitus   . Neuromuscular disorder     periph neuropathy  . Shortness of breath   . Arthritis   . GERD (gastroesophageal reflux disease)   . Blood transfusion   . COPD (chronic obstructive pulmonary disease)   . Cirrhosis     non aloholic   . Pleural effusion 02/13/2012  . RVH (right ventricular hypertrophy) 02/13/2012  . Anemia 02/13/2012  . Chronic respiratory failure 02/15/2012    With hypoxia and hypercapnia.  . Anasarca 01/2012    Ejection fraction 60-65%  . CHF (congestive heart failure)   . Small bowel obstruction   . Umbilical hernia     Past Surgical History  Procedure Laterality Date  . Basal cell carcinoma  excision    . Abdominal surgery      multiple  . Vesicovaginal fistula closure w/ tah    . Cancers resected    . Appendectomy    . Abdominal hysterectomy    . Tubal ligation    . Cholecystectomy    . Colon surgery      hx bleeding ulcer  . Mass excision  06/23/2011    Procedure: EXCISION MASS;  Surgeon: Tennis Must, MD;  Location: [place];  Service: Orthopedics;  Laterality: Left;  left hand excision mass with acell placement  . Left hand  06/23/11  Removed Squamous cell , and also did graft  . Mass excision  11/02/2011    Procedure: EXCISION MASS;  Surgeon: Tennis Must, MD;  Location: [place];  Service: Orthopedics;  Laterality: Left;  EXCISION MASS LEFT HAND with full thickness skin graft from left upper arm  . Colonoscopy with esophagogastroduodenoscopy (egd) N/A 08/01/2012    Procedure: COLONOSCOPY WITH ESOPHAGOGASTRODUODENOSCOPY (EGD);  Surgeon: Rogene Houston, MD;  [facility];  Service: Endoscopy;  Laterality: N/A;  730  . Balloon dilation N/A 08/01/2012    Procedure: BALLOON DILATION;  Surgeon: Rogene Houston, MD;  [facility];  Service: Endoscopy;  Laterality: N/A;  Venia Minks dilation N/A 08/01/2012    Procedure: Venia Minks DILATION;  Surgeon: Rogene Houston, MD;  [facility];  Service: Endoscopy;  Laterality: N/A;  . Savory dilation N/A 08/01/2012    Procedure: SAVORY DILATION;  Surgeon: Rogene Houston, MD;  [facility];  Service: Endoscopy;  Laterality: N/A;    Wilfredo Canterbury A. Jimmye Norman, RD, LDN Pager: [phone]

## 2013-09-06 NOTE — Care Management Note (Addendum)
    Page 1 of 1   09/08/2013     3:02:13 PM   CARE MANAGEMENT NOTE 09/08/2013  Patient:  [name], [name]   Account Number:  [id number]  Date Initiated:  09/06/2013  Documentation initiated by:  Claretha Cooper  Subjective/Objective Assessment:   Pt admitted from home where she lives alone but has family and friends who are often visiting. Pt is active with [facility] for RN and PT. Has walker and prong cane. No additional needs identified.     Action/Plan:   Anticipated DC Date:     Anticipated DC Plan:  [place]  CM consult      [facility] Choice  Resumption Of Svcs/PTA Provider   Choice offered to / List presented to:  C-1 Patient        [place] arranged  HH-1 RN  [place] PT      [place].   Status of service:  Completed, signed off Medicare Important Message given?  YES (If response is "NO", the following Medicare IM given date fields will be blank) Date Medicare IM given:  09/08/2013 Date Additional Medicare IM given:    Discharge Disposition:    Per UR Regulation:    If discussed at Long Length of Stay Meetings, dates discussed:    Comments:  09/06/13 Claretha Cooper RN BSN CM

## 2013-09-06 NOTE — Consult Note (Signed)
Reason for Consult: abdominal pain, nausea Referring Physician: Hospitalist  [name] is an 77 y.o. female.  HPI:  [name] is a 78 yr old white female with known hx of Crohn's disease. At home she was on a Prednisone taper.  She c/o upper abdominal pain since her last discharge on 08/17/2013. She tells me she has not been able to eat. She says she has been losing weight. There was no nausea or vomiting prior to admission.  She takes Hydrocodone once a day for her pain.  The pain generally starts around lunch time. She rates her pain 7-8 after lunch.  Appetite has not bee good. She has lost about 11 pounds since March 19. She generally has 2-3 BMs daily, and sometimes she will skip a day. Her stools are usually loose. She takes Miralax daily. She denies rectal bleeding.  Patient also has hx of cirrhosis secondary to NAFLD.  Hx of atrial fib and maintained on Warfarin.  Her last colonoscopy was in March of 2014. She had a stricture at anorectal junction dilated with a balloon to 87m. Scattered erosions involving mucosa of small bowel proximal to ileocolonic anastomosis.  10-11 mm stricture just proximal to anastomosis with a linear ulcers distal to the stricture. The stricture was dilated with a balloon to 13.5 mm.  Mucosa rest of the colon was normal.    08/28/2012 EGD/ED and /Colonoscopy with dilatation: Stricture noted just proximal to ileocolonic anastomosis. I meter estimated to be 10-11 mm. Linear ulcers noted involving mucosa at distal to the stricture.  Few erosions noted involving mucosa of small bowel proximal to anastomosis. Neoterminal ileum was examined for 20 cm.  Mucosa of the rest of the colon and rectum was normal.  Stricture with scar noted at anorectal junction.  Therapeutic/Diagnostic Maneuvers Performed:  Distal small bowel stricture was dilated with a balloon initially to 12 mm and subsequently 13.5 mm. Less resistance noted as the scope was passed across it closed  dilation. No rectal stricture was dilated to 15 mm with same balloon.  Complications: None  Colon Withdrawal Time: 8 minutes  Impression:  EGD findings.  No evidence of esophageal or gastric varices.  Soft stricture at GE junction.  Noncritical bulbar stricture.  Esophageal dilation with 54 French Maloney dilator resulting in mucosal disruption at proximal esophagus indicative of web in mucosal disruption at GEJ.  Colonoscopy findings.  Stricture at anorectal junction dilated with a balloon to 15 mm.  Scattered erosions involving mucosa of small bowel proximal to ileocolonic anastomosis.  10-11 mm stricture just proximal to anastomosis with a linear ulcers distal to the stricture. The stricture was dilated with a balloon to 13.5 mm.  Mucosa rest of the colon was normal.         Past Medical History  Diagnosis Date  . Hypertension     x 5 years  . Paroxysmal atrial fibrillation   . Anxiety   . Crohn's disease   . 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 No clear documentation of this  . Diabetes mellitus   . Neuromuscular disorder     periph neuropathy  . Shortness of breath   . Arthritis   . GERD (gastroesophageal reflux disease)   . Blood transfusion   . COPD (chronic obstructive pulmonary disease)   . Cirrhosis     non aloholic   . Pleural effusion 02/13/2012  . RVH (right ventricular hypertrophy) 02/13/2012  . Anemia 02/13/2012  . Chronic respiratory failure  02/15/2012    With hypoxia and hypercapnia.  . Anasarca 01/2012    Ejection fraction 60-65%  . CHF (congestive heart failure)   . Small bowel obstruction   . Umbilical hernia     Past Surgical History  Procedure Laterality Date  . Basal cell carcinoma excision    . Abdominal surgery      multiple  . Vesicovaginal fistula closure w/ tah    . Cancers resected    . Appendectomy    . Abdominal hysterectomy    . Tubal ligation    . Cholecystectomy    . Colon surgery      hx bleeding ulcer   . Mass excision  06/23/2011    Procedure: EXCISION MASS;  Surgeon: Tennis Must, MD;  Location: [facility];  Service: Orthopedics;  Laterality: Left;  left hand excision mass with acell placement  . Left hand  06/23/11    Removed Squamous cell , and also did graft  . Mass excision  11/02/2011    Procedure: EXCISION MASS;  Surgeon: Tennis Must, MD;  Location: [facility];  Service: Orthopedics;  Laterality: Left;  EXCISION MASS LEFT HAND with full thickness skin graft from left upper arm  . Colonoscopy with esophagogastroduodenoscopy (egd) N/A 08/01/2012    Procedure: COLONOSCOPY WITH ESOPHAGOGASTRODUODENOSCOPY (EGD);  Surgeon: Rogene Houston, MD;  [facility];  Service: Endoscopy;  Laterality: N/A;  730  . Balloon dilation N/A 08/01/2012    Procedure: BALLOON DILATION;  Surgeon: Rogene Houston, MD;  [facility];  Service: Endoscopy;  Laterality: N/A;  Venia Minks dilation N/A 08/01/2012    Procedure: Venia Minks DILATION;  Surgeon: Rogene Houston, MD;  [facility];  Service: Endoscopy;  Laterality: N/A;  . Savory dilation N/A 08/01/2012    Procedure: SAVORY DILATION;  Surgeon: Rogene Houston, MD;  [facility];  Service: Endoscopy;  Laterality: N/A;    Family History  Problem Relation Age of Onset  . Coronary artery disease Father   . Heart attack Father   . Coronary artery disease Mother   . Coronary artery disease Brother   . Heart attack Brother   . Pancreatic cancer Brother   . Heart attack Brother   . Leukemia Daughter   . Healthy Daughter   . Colon cancer Neg Hx     Social History:  reports that she has been smoking Cigarettes.  She started smoking about 50 years ago. She has a 8 pack-year smoking history. She has never used smokeless tobacco. She reports that she does not drink alcohol or use illicit drugs.  Allergies:  Allergies  Allergen Reactions  . Sulfonamide Derivatives Other (See Comments)    Felt  funny    Medications: I have reviewed the patient's current medications.  Results for orders placed during the hospital encounter of 09/05/13 (from the past 48 hour(s))  URINALYSIS, ROUTINE W REFLEX MICROSCOPIC     Status: None   Collection Time    09/05/13 12:40 PM      Result Value Ref Range   Color, Urine YELLOW  YELLOW   APPearance CLEAR  CLEAR   Specific Gravity, Urine 1.005  1.005 - 1.030   pH 5.5  5.0 - 8.0   Glucose, UA NEGATIVE  NEGATIVE mg/dL   Hgb urine dipstick NEGATIVE  NEGATIVE   Bilirubin Urine NEGATIVE  NEGATIVE   Ketones, ur NEGATIVE  NEGATIVE mg/dL   Protein, ur NEGATIVE  NEGATIVE mg/dL   Urobilinogen, UA 0.2  0.0 - 1.0 mg/dL   Nitrite NEGATIVE  NEGATIVE   Leukocytes, UA NEGATIVE  NEGATIVE   Comment: MICROSCOPIC NOT DONE ON URINES WITH NEGATIVE PROTEIN, BLOOD, LEUKOCYTES, NITRITE, OR GLUCOSE <1000 mg/dL.  CBC WITH DIFFERENTIAL     Status: Abnormal   Collection Time    09/05/13  1:34 PM      Result Value Ref Range   WBC 8.9  4.0 - 10.5 K/uL   RBC 4.57  3.87 - 5.11 MIL/uL   Hemoglobin 11.6 (*) 12.0 - 15.0 g/dL   HCT 36.3  36.0 - 46.0 %   MCV 79.4  78.0 - 100.0 fL   MCH 25.4 (*) 26.0 - 34.0 pg   MCHC 32.0  30.0 - 36.0 g/dL   RDW 18.9 (*) 11.5 - 15.5 %   Platelets 164  150 - 400 K/uL   Neutrophils Relative % 91 (*) 43 - 77 %   Neutro Abs 8.1 (*) 1.7 - 7.7 K/uL   Lymphocytes Relative 6 (*) 12 - 46 %   Lymphs Abs 0.6 (*) 0.7 - 4.0 K/uL   Monocytes Relative 3  3 - 12 %   Monocytes Absolute 0.3  0.1 - 1.0 K/uL   Eosinophils Relative 0  0 - 5 %   Eosinophils Absolute 0.0  0.0 - 0.7 K/uL   Basophils Relative 0  0 - 1 %   Basophils Absolute 0.0  0.0 - 0.1 K/uL  COMPREHENSIVE METABOLIC PANEL     Status: Abnormal   Collection Time    09/05/13  1:34 PM      Result Value Ref Range   Sodium 126 (*) 137 - 147 mEq/L   Potassium 5.0  3.7 - 5.3 mEq/L   Chloride 87 (*) 96 - 112 mEq/L   CO2 25  19 - 32 mEq/L   Glucose, Bld 142 (*) 70 - 99 mg/dL   BUN 19  6 - 23  mg/dL   Creatinine, Ser 1.02  0.50 - 1.10 mg/dL   Calcium 10.1  8.4 - 10.5 mg/dL   Total Protein 7.1  6.0 - 8.3 g/dL   Albumin 3.0 (*) 3.5 - 5.2 g/dL   AST 39 (*) 0 - 37 U/L   ALT 47 (*) 0 - 35 U/L   Alkaline Phosphatase 82  39 - 117 U/L   Total Bilirubin 2.1 (*) 0.3 - 1.2 mg/dL   GFR calc non Af Amer 52 (*) >90 mL/min   GFR calc Af Amer 60 (*) >90 mL/min   Comment: (NOTE)     The eGFR has been calculated using the CKD EPI equation.     This calculation has not been validated in all clinical situations.     eGFR's persistently <90 mL/min signify possible Chronic Kidney     Disease.  LIPASE, BLOOD     Status: None   Collection Time    09/05/13  1:34 PM      Result Value Ref Range   Lipase 54  11 - 59 U/L  TROPONIN I     Status: None   Collection Time    09/05/13  1:34 PM      Result Value Ref Range   Troponin I <0.30  <0.30 ng/mL   Comment:            Due to the release kinetics of cTnI,     a negative result within the first hours     of the  onset of symptoms does not rule out     myocardial infarction with certainty.     If myocardial infarction is still suspected,     repeat the test at appropriate intervals.  PROTIME-INR     Status: Abnormal   Collection Time    09/05/13  1:34 PM      Result Value Ref Range   Prothrombin Time 23.6 (*) 11.6 - 15.2 seconds   INR 2.18 (*) 0.00 - 1.49  LACTIC ACID, PLASMA     Status: Abnormal   Collection Time    09/05/13  1:38 PM      Result Value Ref Range   Lactic Acid, Venous 4.6 (*) 0.5 - 2.2 mmol/L  GLUCOSE, CAPILLARY     Status: Abnormal   Collection Time    09/05/13  8:52 PM      Result Value Ref Range   Glucose-Capillary 163 (*) 70 - 99 mg/dL   Comment 1 Notify RN    BASIC METABOLIC PANEL     Status: Abnormal   Collection Time    09/06/13  6:41 AM      Result Value Ref Range   Sodium 130 (*) 137 - 147 mEq/L   Potassium 5.4 (*) 3.7 - 5.3 mEq/L   Chloride 94 (*) 96 - 112 mEq/L   Comment: DELTA CHECK NOTED   CO2 29  19 -  32 mEq/L   Glucose, Bld 138 (*) 70 - 99 mg/dL   BUN 19  6 - 23 mg/dL   Creatinine, Ser 1.03  0.50 - 1.10 mg/dL   Calcium 9.6  8.4 - 10.5 mg/dL   GFR calc non Af Amer 51 (*) >90 mL/min   GFR calc Af Amer 60 (*) >90 mL/min   Comment: (NOTE)     The eGFR has been calculated using the CKD EPI equation.     This calculation has not been validated in all clinical situations.     eGFR's persistently <90 mL/min signify possible Chronic Kidney     Disease.  CBC     Status: Abnormal   Collection Time    09/06/13  6:41 AM      Result Value Ref Range   WBC 5.9  4.0 - 10.5 K/uL   RBC 3.62 (*) 3.87 - 5.11 MIL/uL   Hemoglobin 9.2 (*) 12.0 - 15.0 g/dL   Comment: DELTA CHECK NOTED   HCT 28.9 (*) 36.0 - 46.0 %   MCV 79.8  78.0 - 100.0 fL   MCH 25.4 (*) 26.0 - 34.0 pg   MCHC 31.8  30.0 - 36.0 g/dL   RDW 18.8 (*) 11.5 - 15.5 %   Platelets 128 (*) 150 - 400 K/uL  PROTIME-INR     Status: Abnormal   Collection Time    09/06/13  6:41 AM      Result Value Ref Range   Prothrombin Time 23.8 (*) 11.6 - 15.2 seconds   INR 2.21 (*) 0.00 - 1.49  LACTIC ACID, PLASMA     Status: None   Collection Time    09/06/13  6:41 AM      Result Value Ref Range   Lactic Acid, Venous 1.7  0.5 - 2.2 mmol/L  GLUCOSE, CAPILLARY     Status: Abnormal   Collection Time    09/06/13  7:52 AM      Result Value Ref Range   Glucose-Capillary 134 (*) 70 - 99 mg/dL   Comment 1 Notify RN  Dg Chest 2 View  09/05/2013   CLINICAL DATA:  Epigastric pain  EXAM: CHEST  2 VIEW  COMPARISON:  08/08/2013  FINDINGS: Cardiomediastinal silhouette is stable. No acute infiltrate or pleural effusion. No pulmonary edema. Mild degenerative changes thoracic spine.  IMPRESSION: No active cardiopulmonary disease.   Electronically Signed   By: Lahoma Crocker M.D.   On: 09/05/2013 15:37   Ct Abdomen Pelvis W Contrast  09/05/2013   CLINICAL DATA:  Abdominal pain, third CT scan in 4 weeks (ordering physician aware), history of crohn's disease, CHF,  cirrhosis  EXAM: CT ABDOMEN AND PELVIS WITH CONTRAST  TECHNIQUE: Multidetector CT imaging of the abdomen and pelvis was performed using the standard protocol following bolus administration of intravenous contrast.  CONTRAST:  164m OMNIPAQUE IOHEXOL 300 MG/ML  SOLN  COMPARISON:  CT ABD - PELV W/ CM dated 08/17/2013; CT ABD - PELV W/ CM dated 08/08/2013  FINDINGS: Small right and minimal left pleural effusions similar to prior study.  Small nodular liver consistent with cirrhosis. Spleen is normal. Splenorenal varices throughout the left flank again identified as well as esophageal and anterior body wall varices. Focal dilatation left renal vein in the region of the left renal hilum, to 3.2 cm, stable. Kidneys normal. Adrenal glands normal. Pancreas head 559mcalcification stable.  Numerous surgical clips seen throughout the mid abdomen and left flank, unchanged. Numerous enlarged mesenteric lymph nodes in the midline, measuring up to 14 mm in short axis, stable. Despite the presence of focal narrowing involving the distal transverse colon, which is stable, there is a nonobstructive bowel gas pattern.  There is a small volume of ascites in the abdomen and pelvis. There is evidence of anasarca  The bladder is normal. In the left adnexa there is a 38 x 40 mm cystic lesion, unchanged from prior study.  There are no acute musculoskeletal findings.  IMPRESSION: 1. Stable findings of cirrhosis with varices, ascites, and pleural effusions 2. Persistent focal narrowing distal transverse colon possibly related to Crohn's disease. Malignancy not entirely excluded. Nonobstructive bowel gas pattern with contrast into the rectum. 3. Persistent left ovarian cyst, malignancy not excluded.   Electronically Signed   By: RaSkipper Cliche.D.   On: 09/05/2013 15:53    ROS Blood pressure 106/65, pulse 62, temperature 97.3 F (36.3 C), temperature source Oral, resp. rate 18, height '4\' 11"'  (1.499 m), weight 183 lb 3.2 oz (83.099 kg),  SpO2 97.00%. Physical Exam Alert and oriented. Skin warm and dry. Oral mucosa is moist.   . Sclera anicteric, conjunctivae is pink. Thyroid not enlarged. No cervical lymphadenopathy. Lungs clear. Heart regular rate irregular.  Abdomen is soft and obese. Tenderness across upper abdomen. Bowel sounds are positive  3-4+ edema to lower extremities.  Assessment/Plan: 2. Persistent focal narrowing distal transverse colon. Hx of Crohn disease. Last dilatation was in March of 2014.   Plan Colonoscopy with stricture dilatation when INR is down to 1.5. Agree with Sulumedrol 3041mV q 12 hrs.  TerRona Ravenstzer 09/06/2013, 8:18 AM   GI attending note; Patient interviewed and examined. Current abdominopelvic CT reviewed and compared with prior study. This is patient's fourth admission in the last 6 months and third admission this year for symptoms of partial small bowel obstruction secondary to relapse of Crohn's disease. She has not responded to medical therapy. Therefore it would be reasonable to proceed with colonoscopy and dilation of anastomotic stricture which has been dilated been dilated previously with benefit. Patient has cirrhosis and she  is deemed to be high risk for other treatment options such as biologic therapy. Abdominal exam reveals protuberant abdomen which is soft without tenderness. She has developed Caput Medusae and she also has perisplenic varices. Will plan colonoscopy with stricture dilation tomorrow if INR down to 1.5.

## 2013-09-06 NOTE — Progress Notes (Addendum)
[facility] PROGRESS NOTE  [name] NWG:[id number] DOB: 1935/07/29 DOA: 09/05/2013 PCP: Redge Gainer, MD  Assessment/Plan  Abdominal pain with nausea and occasional emesis, no evidence of complete bowel obstruction although she does have some stenosis of the transverse colon noted on CT. She may need a dilation of the stricture to improve her symptoms. Her mildly elevated liver function tests are probably secondary to some relative dehydration.  - Appreciate Dr. Laural Golden assistance - Continue Solu-Medrol 30 mg IV twice a day  - No antibiotics at this time  - Clear liquid diet  (or per GI) - Morphine when necessary  - Anti-emetics  - d/c IVF  Elevated lactic acid, likely reflects relative dehydration. No evidence of ischemia on CT scan of the abdomen.  Result with IV fluids.  Atrial fibrillation, currently anticoagulated on warfarin  -  INR still elevated despite vitamin K yesterday -  Repeat dose of vitamin K today -  Repeat INR in a.m. - Rate controlled:  No significant tachycardia -  Okay to discontinue telemetry  Chronic diastolic heart failure with chronic effusions in lower extremity edema, swelling and weight are both down from baseline  - Judicious use of IV fluids  - Hold lasix, spironolactone, and potassium pending improvement in lactic acid   Diabetes mellitus, increasing dose of steroids may require increased insulin  - Hold metformin  - Continue low-dose sliding scale insulin   Anxiety, stable. Continue Xanax and gabapentin   Hyponatremia and hypochloremia due to GI losses/tea/toast diet/dehydration, improving with hydration -  D/c IVF -  Continue CLD  Hyperkalemia, mild, asymptomatic.  Per chart review has intermittent mild hyperkalemia.   -  Restart lasix once tolerating PO better  Iron deficiency anemia, hemoglobin at baseline today - Hold oral iron due to nausea  -  Monitor for signs of bleeding  Thrombocytopenia, mild and likely acute phase  reactant -  Repeat in AM  Generalized weakness, PT/OT consults once feeling better   Severe protein calorie malnutrition as evidenced by weight loss over the last month  - Advance diet as tolerated and add supplements  - Nutrition consultation   Diet: CLD  Access: PIV  IVF: yes  Proph: warfarin  Code Status: Full  Family Communication: patient and daughter  Disposition Plan:   Pending possible procedure by GI in next few days to dilate colonic stenosis    Consultants:  Dr. Laural Golden, GI  Procedures:  CT abd/pelvis  CXR  Antibiotics:  none   HPI/Subjective:  States she feels hungry. She has been tolerating her clear liquid diet, however she continues to have diarrhea which is chronic problem for her. Her abdominal bloating and pain seemed to be better with her pain medication.  Objective: Filed Vitals:   09/05/13 1833 09/05/13 2043 09/06/13 0431 09/06/13 1018  BP: 115/63 119/75 106/65 113/61  Pulse: 69 74 62   Temp: 97.3 F (36.3 C) 97.7 F (36.5 C) 97.3 F (36.3 C)   TempSrc:  Oral Oral   Resp: 18 18 18    Height:      Weight:   83.099 kg (183 lb 3.2 oz)   SpO2: 97% 97% 97%     Intake/Output Summary (Last 24 hours) at 09/06/13 1512 Last data filed at 09/06/13 1306  Gross per 24 hour  Intake    480 ml  Output    850 ml  Net   -370 ml   Filed Weights   09/05/13 1226 09/05/13 1752 09/06/13 0431  Weight: 78.472  kg (173 lb) 82.8 kg (182 lb 8.7 oz) 83.099 kg (183 lb 3.2 oz)    Exam:   General:  Obese Caucasian female, No acute distress  HEENT:  NCAT, MMM  Cardiovascular:  IRRR, nl S1, S2 no mrg, 2+ pulses, warm extremities  Respiratory:   CTAB, no increased WOB  Abdomen:   Normal active bowel sounds, left high-pitched than yesterday, mildly distended, mild tenderness palpation of the epigastrium without rebound or guarding  MSK:   Normal tone and bulk, 1+ bilateral LEE  Neuro:  Grossly intact  Data Reviewed: Basic Metabolic Panel:  Recent  Labs Lab 09/05/13 1334 09/06/13 0641  NA 126* 130*  K 5.0 5.4*  CL 87* 94*  CO2 25 29  GLUCOSE 142* 138*  BUN 19 19  CREATININE 1.02 1.03  CALCIUM 10.1 9.6   Liver Function Tests:  Recent Labs Lab 09/05/13 1334  AST 39*  ALT 47*  ALKPHOS 82  BILITOT 2.1*  PROT 7.1  ALBUMIN 3.0*    Recent Labs Lab 09/05/13 1334  LIPASE 54   No results found for this basename: AMMONIA,  in the last 168 hours CBC:  Recent Labs Lab 09/05/13 1334 09/06/13 0641  WBC 8.9 5.9  NEUTROABS 8.1*  --   HGB 11.6* 9.2*  HCT 36.3 28.9*  MCV 79.4 79.8  PLT 164 128*   Cardiac Enzymes:  Recent Labs Lab 09/05/13 1334  TROPONINI <0.30   BNP (last 3 results) No results found for this basename: PROBNP,  in the last 8760 hours CBG:  Recent Labs Lab 09/05/13 2052 09/06/13 0752 09/06/13 1151  GLUCAP 163* 134* 154*    No results found for this or any previous visit (from the past 240 hour(s)).   Studies: Dg Chest 2 View  09/05/2013   CLINICAL DATA:  Epigastric pain  EXAM: CHEST  2 VIEW  COMPARISON:  08/08/2013  FINDINGS: Cardiomediastinal silhouette is stable. No acute infiltrate or pleural effusion. No pulmonary edema. Mild degenerative changes thoracic spine.  IMPRESSION: No active cardiopulmonary disease.   Electronically Signed   By: Lahoma Crocker M.D.   On: 09/05/2013 15:37   Ct Abdomen Pelvis W Contrast  09/05/2013   CLINICAL DATA:  Abdominal pain, third CT scan in 4 weeks (ordering physician aware), history of crohn's disease, CHF, cirrhosis  EXAM: CT ABDOMEN AND PELVIS WITH CONTRAST  TECHNIQUE: Multidetector CT imaging of the abdomen and pelvis was performed using the standard protocol following bolus administration of intravenous contrast.  CONTRAST:  126mL OMNIPAQUE IOHEXOL 300 MG/ML  SOLN  COMPARISON:  CT ABD - PELV W/ CM dated 08/17/2013; CT ABD - PELV W/ CM dated 08/08/2013  FINDINGS: Small right and minimal left pleural effusions similar to prior study.  Small nodular liver  consistent with cirrhosis. Spleen is normal. Splenorenal varices throughout the left flank again identified as well as esophageal and anterior body wall varices. Focal dilatation left renal vein in the region of the left renal hilum, to 3.2 cm, stable. Kidneys normal. Adrenal glands normal. Pancreas head 11mm calcification stable.  Numerous surgical clips seen throughout the mid abdomen and left flank, unchanged. Numerous enlarged mesenteric lymph nodes in the midline, measuring up to 14 mm in Juston Goheen axis, stable. Despite the presence of focal narrowing involving the distal transverse colon, which is stable, there is a nonobstructive bowel gas pattern.  There is a small volume of ascites in the abdomen and pelvis. There is evidence of anasarca  The bladder is normal. In the left  adnexa there is a 38 x 40 mm cystic lesion, unchanged from prior study.  There are no acute musculoskeletal findings.  IMPRESSION: 1. Stable findings of cirrhosis with varices, ascites, and pleural effusions 2. Persistent focal narrowing distal transverse colon possibly related to Crohn's disease. Malignancy not entirely excluded. Nonobstructive bowel gas pattern with contrast into the rectum. 3. Persistent left ovarian cyst, malignancy not excluded.   Electronically Signed   By: Skipper Cliche M.D.   On: 09/05/2013 15:53    Scheduled Meds: . ALPRAZolam  0.25-0.5 mg Oral Daily  . diltiazem  180 mg Oral Daily  . feeding supplement (RESOURCE BREEZE)  1 Container Oral TID BM  . gabapentin  100 mg Oral QHS  . insulin aspart  0-5 Units Subcutaneous QHS  . insulin aspart  0-9 Units Subcutaneous TID WC  . mesalamine  1,000 mg Oral TID  . methylPREDNISolone (SOLU-MEDROL) injection  30 mg Intravenous Q12H  . multivitamin with minerals  1 tablet Oral Daily  . pantoprazole  40 mg Oral Daily  . phytonadione  5 mg Oral Once  . sodium chloride  3 mL Intravenous Q12H  . vitamin C  500 mg Oral Daily   Continuous Infusions:   Active  Problems:   DM   OVERWEIGHT/OBESITY   ANXIETY   HYPERTENSION, UNSPECIFIED   Atrial fibrillation   CROHN'S DISEASE   Tobacco abuse   Chronic respiratory failure   Abdominal pain   Hyponatremia   Lactic acid increased   Protein-calorie malnutrition, severe    Time spent: 30 min    [facility] Pager [phone]. If 7PM-7AM, please contact night-coverage at [email or web address], password [facility] 09/06/2013, 3:12 PM  LOS: 1 day

## 2013-09-07 ENCOUNTER — Encounter (HOSPITAL_COMMUNITY)
Admission: EM | Disposition: A | Discharge status: Home Health | Payer: Self-pay | Source: Home / Self Care | Attending: Internal Medicine

## 2013-09-07 ENCOUNTER — Other Ambulatory Visit: Payer: Self-pay | Admitting: Family Medicine

## 2013-09-07 DIAGNOSIS — K5080 Crohn's disease of both small and large intestine without complications: Principal | ICD-10-CM

## 2013-09-07 DIAGNOSIS — K56609 Unspecified intestinal obstruction, unspecified as to partial versus complete obstruction: Secondary | ICD-10-CM

## 2013-09-07 DIAGNOSIS — K624 Stenosis of anus and rectum: Secondary | ICD-10-CM

## 2013-09-07 DIAGNOSIS — I4891 Unspecified atrial fibrillation: Secondary | ICD-10-CM

## 2013-09-07 HISTORY — PX: COLONOSCOPY: SHX5424

## 2013-09-07 LAB — CBC
HCT: 30.3 % — ABNORMAL LOW (ref 36.0–46.0)
HEMOGLOBIN: 9.6 g/dL — AB (ref 12.0–15.0)
MCH: 25.3 pg — ABNORMAL LOW (ref 26.0–34.0)
MCHC: 31.7 g/dL (ref 30.0–36.0)
MCV: 79.7 fL (ref 78.0–100.0)
Platelets: 136 10*3/uL — ABNORMAL LOW (ref 150–400)
RBC: 3.8 MIL/uL — AB (ref 3.87–5.11)
RDW: 18.7 % — ABNORMAL HIGH (ref 11.5–15.5)
WBC: 8 10*3/uL (ref 4.0–10.5)

## 2013-09-07 LAB — TYPE AND SCREEN
ABO/RH(D): O NEG
Antibody Screen: NEGATIVE

## 2013-09-07 LAB — BASIC METABOLIC PANEL
BUN: 16 mg/dL (ref 6–23)
CALCIUM: 9.8 mg/dL (ref 8.4–10.5)
CO2: 30 meq/L (ref 19–32)
Chloride: 96 mEq/L (ref 96–112)
Creatinine, Ser: 0.96 mg/dL (ref 0.50–1.10)
GFR calc Af Amer: 65 mL/min — ABNORMAL LOW (ref >90)
GFR, EST NON AFRICAN AMERICAN: 56 mL/min — AB (ref >90)
GLUCOSE: 144 mg/dL — AB (ref 70–99)
POTASSIUM: 4.8 meq/L (ref 3.7–5.3)
SODIUM: 131 meq/L — AB (ref 137–147)

## 2013-09-07 LAB — PROTIME-INR
INR: 2.37 — ABNORMAL HIGH (ref 0.00–1.49)
INR: 1.97 — ABNORMAL HIGH (ref 0.00–1.49)
PROTHROMBIN TIME: 25.1 s — AB (ref 11.6–15.2)
Prothrombin Time: 21.8 seconds — ABNORMAL HIGH (ref 11.6–15.2)

## 2013-09-07 LAB — GLUCOSE, CAPILLARY
GLUCOSE-CAPILLARY: 166 mg/dL — AB (ref 70–99)
Glucose-Capillary: 125 mg/dL — ABNORMAL HIGH (ref 70–99)
Glucose-Capillary: 209 mg/dL — ABNORMAL HIGH (ref 70–99)

## 2013-09-07 SURGERY — COLONOSCOPY
Anesthesia: Moderate Sedation

## 2013-09-07 MED ORDER — METRONIDAZOLE 500 MG PO TABS
250.0000 mg | ORAL_TABLET | Freq: Three times a day (TID) | ORAL | Status: DC
  Administered 2013-09-07 – 2013-09-08 (×3): 250 mg via ORAL
  Filled 2013-09-07 (×3): qty 1

## 2013-09-07 MED ORDER — MIDAZOLAM HCL 5 MG/5ML IJ SOLN
INTRAMUSCULAR | Status: AC
  Filled 2013-09-07: qty 10

## 2013-09-07 MED ORDER — FUROSEMIDE 10 MG/ML IJ SOLN
20.0000 mg | Freq: Once | INTRAMUSCULAR | Status: AC
  Administered 2013-09-07: 20 mg via INTRAVENOUS
  Filled 2013-09-07: qty 2

## 2013-09-07 MED ORDER — MIDAZOLAM HCL 5 MG/5ML IJ SOLN
INTRAMUSCULAR | Status: DC | PRN
  Administered 2013-09-07 (×2): 2 mg via INTRAVENOUS
  Administered 2013-09-07 (×2): 1 mg via INTRAVENOUS

## 2013-09-07 MED ORDER — STERILE WATER FOR IRRIGATION IR SOLN
Status: DC | PRN
  Administered 2013-09-07: 17:00:00

## 2013-09-07 MED ORDER — PEG 3350-KCL-NA BICARB-NACL 420 G PO SOLR
4000.0000 mL | Freq: Once | ORAL | Status: AC
  Administered 2013-09-07: 4000 mL via ORAL
  Filled 2013-09-07: qty 4000

## 2013-09-07 MED ORDER — MEPERIDINE HCL 50 MG/ML IJ SOLN
INTRAMUSCULAR | Status: AC
  Filled 2013-09-07: qty 1

## 2013-09-07 MED ORDER — MEPERIDINE HCL 50 MG/ML IJ SOLN
INTRAMUSCULAR | Status: DC | PRN
  Administered 2013-09-07 (×2): 25 mg via INTRAVENOUS

## 2013-09-07 NOTE — Op Note (Signed)
COLONOSCOPY PROCEDURE REPORT  PATIENT:  [name]  MR#:  [id number] Birthdate:  [birth year], 78 y.o., female Endoscopist:  Dr. Rogene Houston, MD Referred By:  Dr. Beckey Rutter, MD  Procedure Date: 09/07/2013  Procedure:   Colonoscopy with balloon dilation of ileal colonic and anal rectal stricture.  Indications:  Patient is 78 year old Caucasian female who has Crohn disease with multiple prior surgeries who presents with recurrent symptoms of abdominal pain nausea and vomiting felt to be secondary to relapse of her Crohn's disease. It appears ileocolonic anastomotic stricture has progressed and the source of her symptoms. She is therefore undergoing diagnostic/therapeutic colonoscopy. Patient received 3 units of FFP to correct coagulopathy. Patient is chronically anticoagulated for atrial fibrillation.  Informed Consent:  The procedure and risks were reviewed with the patient and informed consent was obtained.  Medications:  Demerol 50 mg IV Versed 6 mg IV  Description of procedure:  After a digital rectal exam was performed, that colonoscope was advanced from the anus through the rectum and colon to the area of  Transverse colon were ileocolonic anastomosis was identified. Scope was passed into the distal small bowel proximal to anastomosis. As the scope was slowly and cautiously withdrawn. The mucosal surfaces were carefully surveyed utilizing scope tip to flexion to facilitate fold flattening as needed. The scope was pulled down into the rectum where a thorough exam including retroflexion was performed.  Findings:   Prep excellent. Normal mucosa of distal small bowel. At least 20 cm was examined. Stricture noted at ileocolonic anastomosis with mucosal edema erythema erosions and linear ulcer just distal to the narrowest part. This stricture was dilated with a balloon from 12-15 mm as below. There was a mucosal bridge just below the anastomosis dividing the lumen into large and a small  compartment. Mucosa of the rest of the colon and rectum was normal. Stricture noted involving anal rectal junction. This stricture was dilated with balloon to 15 mm.   Therapeutic/Diagnostic Maneuvers Performed:   Stricture at ileocolonic anastomosis was dilated with balloon dilator. A balloon dilator was advanced the scope. The guidewire was pushed into distal small bowel under direct vision. The balloon dilator was positioned across the stricture and dilated to 12 mm, 13 mm and finally to 15 mm. Balloon was deflated and withdrawn. No bleeding was noted with dilation. Anal rectal stricture was dilated with the same balloon to 15 mm.  Complications:  None  Colon Withdrawal Time:  20 minutes  Impression:  Ileocolonic stricture with inflammatory changes secondary to Crohn's disease. This stricture was dilated with a balloon to 15 mm. Anorectal stricture without inflammatory changes. This stricture was dilated to 15 mm.  Recommendations:  Full liquid diet. Continue IV steroids for another 24 hours. Metronidazole 250 mg by mouth 3 times a day. CBC and INR in a.m.  Rogene Houston  09/07/2013 5:12 PM  CC: Dr. Redge Gainer, MD & Dr. Rayne Du ref. provider found

## 2013-09-07 NOTE — Telephone Encounter (Signed)
Patient admitted to [facility] 09/05/13 for malnutrition. Tonya with [facility] notified.

## 2013-09-07 NOTE — Progress Notes (Signed)
[facility] PROGRESS NOTE  [name] ZTI:[id number] DOB: [birth year] DOA: 09/05/2013 PCP: Redge Gainer, MD  Assessment/Plan  Abdominal pain with nausea and occasional emesis, no evidence of complete bowel obstruction although she does have some stenosis of the transverse colon noted on CT. She may need a dilation of the stricture to improve her symptoms. Her mildly elevated liver function tests are probably secondary to some relative dehydration.  - Appreciate Dr. Laural Golden assistance - Continue Solu-Medrol 30 mg IV twice a day  - No antibiotics at this time  - NPO for dilation later today - INR still elevated so will transfuse 3 units FFP - Morphine when necessary  - Anti-emetics   Elevated lactic acid, likely reflects relative dehydration. No evidence of ischemia on CT scan of the abdomen.  Result with IV fluids.  Atrial fibrillation, currently anticoagulated on warfarin  -  INR still elevated despite 2 doses of vitamin K.  Agree that cirrhosis may be contributing to persistently elevated INR -  Transfuse FFP today prior to procedure -  Rate controlled:  No significant tachycardia  Chronic diastolic heart failure with chronic effusions in lower extremity edema, swelling and weight are both down from baseline  - Hold lasix, spironolactone, and potassium - Plan to give 1 dose of Lasix with FFP infusion  Diabetes mellitus, CBG well controlled - Hold metformin  - Continue low-dose sliding scale insulin   Anxiety, stable. Continue Xanax and gabapentin   Hyponatremia and hypochloremia due to GI losses/tea/toast diet/dehydration, improving with hydration -  Continue CLD  Hyperkalemia, mild, asymptomatic.  Per chart review has intermittent mild hyperkalemia.  Resolved.  Iron deficiency anemia, hemoglobin at baseline and stable -  Hold oral iron due to nausea  -  Monitor for signs of bleeding  Thrombocytopenia, mild and likely acute phase reactant, stable.   Generalized  weakness, PT/OT consults once feeling better   Severe protein calorie malnutrition as evidenced by weight loss over the last month  - Advance diet as tolerated and add supplements  - Nutrition consultation   Diet: NPO for procedure  Access: PIV  IVF: yes  Proph: warfarin  Code Status: Full  Family Communication: patient and daughter  Disposition Plan:   Pending dilation of colonic stenosis    Consultants:  Dr. Laural Golden, GI  Procedures:  CT abd/pelvis  CXR  Antibiotics:  none   HPI/Subjective:  States she feels hungry.  Abdomen is less distended and less tender. Pain medication seems to be helping. No bowel movement this morning.  Objective: Filed Vitals:   09/06/13 1018 09/06/13 2034 09/07/13 0534 09/07/13 0802  BP: 113/61 118/72 116/61 125/70  Pulse:  78 84   Temp:  97.8 F (36.6 C) 97.6 F (36.4 C)   TempSrc:  Oral Oral   Resp:  18 18   Height:      Weight:   83.4 kg (183 lb 13.8 oz)   SpO2:  98% 95%     Intake/Output Summary (Last 24 hours) at 09/07/13 0913 Last data filed at 09/07/13 0742  Gross per 24 hour  Intake    240 ml  Output    600 ml  Net   -360 ml   Filed Weights   09/05/13 1752 09/06/13 0431 09/07/13 0534  Weight: 82.8 kg (182 lb 8.7 oz) 83.099 kg (183 lb 3.2 oz) 83.4 kg (183 lb 13.8 oz)    Exam:   General:  Obese Caucasian female, No acute distress  HEENT:  NCAT, MMM  Cardiovascular:  IRRR, nl S1, S2 no mrg, 2+ pulses, warm extremities  Respiratory:   CTAB, no increased WOB  Abdomen:   Normal active bowel sounds, mildly distended, nontender  MSK:   Normal tone and bulk, 1+ bilateral LEE  Neuro:  Grossly intact  Data Reviewed: Basic Metabolic Panel:  Recent Labs Lab 09/05/13 1334 09/06/13 0641 09/07/13 0730  NA 126* 130* 131*  K 5.0 5.4* 4.8  CL 87* 94* 96  CO2 25 29 30   GLUCOSE 142* 138* 144*  BUN 19 19 16   CREATININE 1.02 1.03 0.96  CALCIUM 10.1 9.6 9.8   Liver Function Tests:  Recent Labs Lab  09/05/13 1334  AST 39*  ALT 47*  ALKPHOS 82  BILITOT 2.1*  PROT 7.1  ALBUMIN 3.0*    Recent Labs Lab 09/05/13 1334  LIPASE 54   No results found for this basename: AMMONIA,  in the last 168 hours CBC:  Recent Labs Lab 09/05/13 1334 09/06/13 0641 09/07/13 0730  WBC 8.9 5.9 8.0  NEUTROABS 8.1*  --   --   HGB 11.6* 9.2* 9.6*  HCT 36.3 28.9* 30.3*  MCV 79.4 79.8 79.7  PLT 164 128* 136*   Cardiac Enzymes:  Recent Labs Lab 09/05/13 1334  TROPONINI <0.30   BNP (last 3 results) No results found for this basename: PROBNP,  in the last 8760 hours CBG:  Recent Labs Lab 09/06/13 0752 09/06/13 1151 09/06/13 1632 09/06/13 2037 09/07/13 0740  GLUCAP 134* 154* 213* 174* 125*    Recent Results (from the past 240 hour(s))  URINE CULTURE     Status: None   Collection Time    09/05/13  1:51 PM      Result Value Ref Range Status   Specimen Description URINE, CLEAN CATCH   Final   Special Requests NONE   Final   Culture  Setup Time     Final   Value: 09/05/2013 23:25     Performed at [facility] Count     Final   Value: >=100,000 COLONIES/ML     Performed at [facility]   Culture     Final   Value: Multiple bacterial morphotypes present, none predominant. Suggest appropriate recollection if clinically indicated.     Performed at [facility]   Report Status 09/06/2013 FINAL   Final     Studies: Dg Chest 2 View  09/05/2013   CLINICAL DATA:  Epigastric pain  EXAM: CHEST  2 VIEW  COMPARISON:  08/08/2013  FINDINGS: Cardiomediastinal silhouette is stable. No acute infiltrate or pleural effusion. No pulmonary edema. Mild degenerative changes thoracic spine.  IMPRESSION: No active cardiopulmonary disease.   Electronically Signed   By: Lahoma Crocker M.D.   On: 09/05/2013 15:37   Ct Abdomen Pelvis W Contrast  09/05/2013   CLINICAL DATA:  Abdominal pain, third CT scan in 4 weeks (ordering physician aware), history of crohn's disease, CHF,  cirrhosis  EXAM: CT ABDOMEN AND PELVIS WITH CONTRAST  TECHNIQUE: Multidetector CT imaging of the abdomen and pelvis was performed using the standard protocol following bolus administration of intravenous contrast.  CONTRAST:  181mL OMNIPAQUE IOHEXOL 300 MG/ML  SOLN  COMPARISON:  CT ABD - PELV W/ CM dated 08/17/2013; CT ABD - PELV W/ CM dated 08/08/2013  FINDINGS: Small right and minimal left pleural effusions similar to prior study.  Small nodular liver consistent with cirrhosis. Spleen is normal. Splenorenal varices throughout the left flank again identified as well as  esophageal and anterior body wall varices. Focal dilatation left renal vein in the region of the left renal hilum, to 3.2 cm, stable. Kidneys normal. Adrenal glands normal. Pancreas head 70mm calcification stable.  Numerous surgical clips seen throughout the mid abdomen and left flank, unchanged. Numerous enlarged mesenteric lymph nodes in the midline, measuring up to 14 mm in Elkin [place] axis, stable. Despite the presence of focal narrowing involving the distal transverse colon, which is stable, there is a nonobstructive bowel gas pattern.  There is a small volume of ascites in the abdomen and pelvis. There is evidence of anasarca  The bladder is normal. In the left adnexa there is a 38 x 40 mm cystic lesion, unchanged from prior study.  There are no acute musculoskeletal findings.  IMPRESSION: 1. Stable findings of cirrhosis with varices, ascites, and pleural effusions 2. Persistent focal narrowing distal transverse colon possibly related to Crohn's disease. Malignancy not entirely excluded. Nonobstructive bowel gas pattern with contrast into the rectum. 3. Persistent left ovarian cyst, malignancy not excluded.   Electronically Signed   By: Skipper Cliche M.D.   On: 09/05/2013 15:53    Scheduled Meds: . ALPRAZolam  0.25-0.5 mg Oral Daily  . diltiazem  180 mg Oral Daily  . feeding supplement (RESOURCE BREEZE)  1 Container Oral TID BM  . gabapentin   100 mg Oral QHS  . insulin aspart  0-5 Units Subcutaneous QHS  . insulin aspart  0-9 Units Subcutaneous TID WC  . mesalamine  1,000 mg Oral TID  . methylPREDNISolone (SOLU-MEDROL) injection  30 mg Intravenous Q12H  . multivitamin with minerals  1 tablet Oral Daily  . pantoprazole  40 mg Oral Daily  . polyethylene glycol-electrolytes  4,000 mL Oral Once  . sodium chloride  3 mL Intravenous Q12H  . vitamin C  500 mg Oral Daily   Continuous Infusions:   Active Problems:   DM   OVERWEIGHT/OBESITY   ANXIETY   HYPERTENSION, UNSPECIFIED   Atrial fibrillation   CROHN'S DISEASE   Tobacco abuse   Chronic respiratory failure   Abdominal pain   Hyponatremia   Lactic acid increased   Protein-calorie malnutrition, severe    Time spent: 30 min    [facility] Pager [phone]. If 7PM-7AM, please contact night-coverage at [email or web address], password [facility] 09/07/2013, 9:13 AM  LOS: 2 days

## 2013-09-07 NOTE — Progress Notes (Signed)
Patient ID: [name], female   DOB: 20-Nov-1935, 78 y.o.   MRN: [id number] Last BM yesterday. Feel pretty. No abdominal pain Filed Vitals:   09/06/13 1018 09/06/13 2034 09/07/13 0534 09/07/13 0802  BP: 113/61 118/72 116/61 125/70  Pulse:  78 84   Temp:  97.8 F (36.6 C) 97.6 F (36.4 C)   TempSrc:  Oral Oral   Resp:  18 18   Height:      Weight:   183 lb 13.8 oz (83.4 kg)   SpO2:  98% 95%   Lungs clear. BS+ 09/07/2013 PT/INR 25.1 and  2.37. Patient will receive FFP today. Golytely today: colonoscopy/dilatation later today. Keep patient NPO

## 2013-09-08 DIAGNOSIS — K5080 Crohn's disease of both small and large intestine without complications: Secondary | ICD-10-CM

## 2013-09-08 DIAGNOSIS — E871 Hypo-osmolality and hyponatremia: Secondary | ICD-10-CM

## 2013-09-08 DIAGNOSIS — K56609 Unspecified intestinal obstruction, unspecified as to partial versus complete obstruction: Secondary | ICD-10-CM

## 2013-09-08 DIAGNOSIS — K624 Stenosis of anus and rectum: Secondary | ICD-10-CM

## 2013-09-08 DIAGNOSIS — R109 Unspecified abdominal pain: Secondary | ICD-10-CM

## 2013-09-08 LAB — PREPARE FRESH FROZEN PLASMA
UNIT DIVISION: 0
Unit division: 0
Unit division: 0

## 2013-09-08 LAB — BASIC METABOLIC PANEL
BUN: 16 mg/dL (ref 6–23)
CO2: 33 meq/L — AB (ref 19–32)
Calcium: 9.7 mg/dL (ref 8.4–10.5)
Chloride: 98 mEq/L (ref 96–112)
Creatinine, Ser: 0.88 mg/dL (ref 0.50–1.10)
GFR calc Af Amer: 72 mL/min — ABNORMAL LOW (ref >90)
GFR, EST NON AFRICAN AMERICAN: 62 mL/min — AB (ref >90)
GLUCOSE: 126 mg/dL — AB (ref 70–99)
Potassium: 4.7 mEq/L (ref 3.7–5.3)
SODIUM: 135 meq/L — AB (ref 137–147)

## 2013-09-08 LAB — CBC
HCT: 31.4 % — ABNORMAL LOW (ref 36.0–46.0)
Hemoglobin: 9.9 g/dL — ABNORMAL LOW (ref 12.0–15.0)
MCH: 25.6 pg — AB (ref 26.0–34.0)
MCHC: 31.5 g/dL (ref 30.0–36.0)
MCV: 81.1 fL (ref 78.0–100.0)
Platelets: 136 10*3/uL — ABNORMAL LOW (ref 150–400)
RBC: 3.87 MIL/uL (ref 3.87–5.11)
RDW: 18.8 % — ABNORMAL HIGH (ref 11.5–15.5)
WBC: 7 10*3/uL (ref 4.0–10.5)

## 2013-09-08 LAB — GLUCOSE, CAPILLARY
GLUCOSE-CAPILLARY: 246 mg/dL — AB (ref 70–99)
Glucose-Capillary: 128 mg/dL — ABNORMAL HIGH (ref 70–99)

## 2013-09-08 LAB — PROTIME-INR
INR: 2.22 — ABNORMAL HIGH (ref 0.00–1.49)
PROTHROMBIN TIME: 23.9 s — AB (ref 11.6–15.2)

## 2013-09-08 MED ORDER — METRONIDAZOLE 250 MG PO TABS: 250.0000 mg | ORAL_TABLET | Freq: Three times a day (TID) | ORAL | Status: AC

## 2013-09-08 MED ORDER — PREDNISONE 20 MG PO TABS: 30.0000 mg | ORAL_TABLET | Freq: Every day | ORAL | Status: DC

## 2013-09-08 MED ORDER — ASPIRIN EC 81 MG PO TBEC: 81.0000 mg | DELAYED_RELEASE_TABLET | Freq: Every day | ORAL | Status: DC

## 2013-09-08 MED ORDER — ENSURE COMPLETE PO LIQD: 237.0000 mL | Freq: Two times a day (BID) | ORAL | Status: AC

## 2013-09-08 MED ORDER — ENSURE COMPLETE PO LIQD: 237.0000 mL | Freq: Two times a day (BID) | ORAL | Status: DC

## 2013-09-08 MED ORDER — ONDANSETRON HCL 4 MG PO TABS: 4.0000 mg | ORAL_TABLET | Freq: Three times a day (TID) | ORAL | Status: AC | PRN

## 2013-09-08 MED ORDER — PREDNISONE 5 MG PO TABS: 30.0000 mg | ORAL_TABLET | Freq: Every day | ORAL | Status: DC

## 2013-09-08 MED ORDER — HYDROCODONE-ACETAMINOPHEN 5-325 MG PO TABS: ORAL_TABLET | ORAL | Status: DC

## 2013-09-08 NOTE — Progress Notes (Signed)
NUTRITION FOLLOW UP  Intervention:   Continue with current nutrition plan of care  Nutrition Dx:   Inadequate oral intake related to poor appetite as evidenced by 4.4% wt loss x 1 week; progressing  Goal:   Pt will meet >90% of estimated nutritional needs  Monitor:   PO intake, labs, skin assessments, weight changes, I/O's  Assessment:   Pt s/p colonoscopy on 09/07/13 which revealed ileocolonic stricture secondary to Crohn's disease. Diet has been advanced to a soft diet and pt is tolerating well; PO: 100%. Also noted resource breeze was d/c for Ensure Complete po BID, each supplement provides 350 kcal and 13 grams of protein.  Weight has been stable since admission.   Height: Ht Readings from Last 1 Encounters:  09/05/13 4\' 11"  (1.499 m)    Weight Status:   Wt Readings from Last 1 Encounters:  09/08/13 183 lb 6.8 oz (83.2 kg)    Re-estimated needs:  Kcal: 1200-1400 daily  Protein: 83-100 grams daily  Fluid: 1.2-1.4 L daily  Skin: Intact  Diet Order: Bland  No intake or output data in the 24 hours ending 09/08/13 1511  Last BM: 09/07/13   Labs:   Recent Labs Lab 09/06/13 0641 09/07/13 0730 09/08/13 0622  NA 130* 131* 135*  K 5.4* 4.8 4.7  CL 94* 96 98  CO2 29 30 33*  BUN 19 16 16   CREATININE 1.03 0.96 0.88  CALCIUM 9.6 9.8 9.7  GLUCOSE 138* 144* 126*    CBG (last 3)   Recent Labs  09/07/13 2239 09/08/13 0813 09/08/13 1119  GLUCAP 209* 128* 246*    Scheduled Meds: . ALPRAZolam  0.25-0.5 mg Oral Daily  . diltiazem  180 mg Oral Daily  . feeding supplement (ENSURE COMPLETE)  237 mL Oral BID BM  . gabapentin  100 mg Oral QHS  . insulin aspart  0-5 Units Subcutaneous QHS  . insulin aspart  0-9 Units Subcutaneous TID WC  . mesalamine  1,000 mg Oral TID  . metroNIDAZOLE  250 mg Oral TID PC  . multivitamin with minerals  1 tablet Oral Daily  . pantoprazole  40 mg Oral Daily  . [START ON 09/09/2013] predniSONE  30 mg Oral Q breakfast  . sodium  chloride  3 mL Intravenous Q12H  . vitamin C  500 mg Oral Daily    Continuous Infusions:   [place] A. Jimmye Norman, RD, LDN Pager: [phone]

## 2013-09-08 NOTE — Discharge Summary (Signed)
Physician Discharge Summary  [name] LFY:[id number] DOB: [birth year] DOA: 09/05/2013  PCP: Redge Gainer, MD  Admit date: 09/05/2013 Discharge date: 09/08/2013  Recommendations for Outpatient Follow-up:  1. Follow up with gastroenterology on Tuesday 2. Given prescription for prednisone 30 mg once a day, metronidazole 250 mg 3 times a day 3. Advised seat a low fiber diet until she follows up with gastroenterology. 4. Due to labile INR which may be secondary to cirrhosis, her warfarin has been discontinued.  Recommending she take aspirin 81 mg once a day and have her INR repeated on Tuesday. If her INR remains elevated, consider either continuing aspirin 81 mg daily for stroke prevention or transitioning to a newer oral anticoagulant. I would be cautious with the use of the newer agents, however, because these medications have not yet been studied well in patients with cirrhosis. 5. Follow up with primary care doctor in 1-2 weeks for BMP, CBC, and A1c.  Review CBG.    Discharge Diagnoses:  Active Problems:   DM   OVERWEIGHT/OBESITY   ANXIETY   HYPERTENSION, UNSPECIFIED   Atrial fibrillation   CROHN'S DISEASE   Tobacco abuse   Chronic respiratory failure   Abdominal pain   Hyponatremia   Lactic acid increased   Protein-calorie malnutrition, severe   Discharge Condition: Stable, improved  Diet recommendation: Soft  Wt Readings from Last 3 Encounters:  09/08/13 83.2 kg (183 lb 6.8 oz)  09/08/13 83.2 kg (183 lb 6.8 oz)  08/28/13 86.637 kg (191 lb)    History of present illness:  The patient is a 78 y.o. year-old female with history of Crohn's disease on mesalamine and steroids taper after an admission a few weeks ago for partial bowel obstruction, paroxysmal atrial fibrillation on warfarin, chronic diastolic heart failure, hypertension, COPD, diabetes mellitus, non-alcoholic cirrhosis who presents with persistent abdominal pain. The patient was admitted approximately 3 weeks ago  with partial bowel obstruction for which she received antibiotics and IV steroids. She has been completing a long course of steroids in a tapering dose and is currently in 20 mg daily. She states when she was discharged from the hospital she had reasonable appetite, improved nausea and abdominal pain, however for the last few weeks, her epigastric pain has been 10 out of 10 and not relieved by anything except for pain medication. No radiation, and she is not specific about the quality of her pain. She has frequent nausea that prevents her from eating with occasional nonbilious, nonbloody emesis. She has liquidy nonbloody stools approximately every 3 days. She has been taking Gas-X and stool softeners such as MiraLax. She denies fevers, URI symptoms, dysuria or other symptoms of urinary tract infection.  In the emergency department, her labs were notable for a sodium of 126, chloride 87, glucose 142, mildly elevated bilirubin and AST/ALT. Her lactic acid was 4.6. CT scan of the abdomen and pelvis demonstrated stable findings of cirrhosis, persistent focal narrowing of the distal transverse colon and a nonobstructive bowel gas pattern with contrast in the rectum. There was also an incidentally noted persistent left ovarian cyst. She was given some IV fluids and is being admitted for pain and nausea control.   Hospital Course:   Abdominal pain with nausea and occasional emesis, no evidence of complete bowel obstruction although she did have some stenosis of the transverse colon noted on CT.  She was evaluated by Dr. Laural Golden who was concerned for Crohn's flare.  She was started on Solu-Medrol 30 mg IV twice a  day. She did not require antibiotics. She was given a clear liquid diet and underwent lower endoscopy on 4/9. She was found to have inflammation at the area of the stenosis and she was dilated to 15 mm. She tolerated the procedure well, and she was tolerating a soft diet at the time of discharge without any  cramping, abdominal pain, nausea, vomiting, diarrhea. She had a normal bowel movement prior to discharge. She will be given prescriptions for prednisone 30 mg once a day, Flagyl 250 mg 3 times a day, pain medication, and anti-emetics.  Elevated lactic acid, likely reflects relative dehydration. No evidence of ischemia on CT scan of the abdomen. Resolved with IV fluids.   Atrial fibrillation, previously anticoagulated on warfarin.  Her warfarin was discontinued. She was given 10 mg of vitamin K however her INR did not trend down. This likely reflects progression of her cirrhosis.  Because her INR was elevated, prior to her endoscopy she received 3 units of FFP. She did not have any bleeding consultations. Recommend that she start taking aspirin 81 mg once daily for stroke prevention for atrial fibrillation despite the fact that she has an elevated chance to score. She can have an INR repeated by gastroenterology at her followup appointment on Tuesday. If her INR has trended down to normal limits, and may be safe to restart her warfarin. If her INR remains elevated, one could consider the use of novel oral anticoagulants, however these medications have not been well studied in the cirrhotic patients.  Her rate remained controlled.  Chronic diastolic heart failure with chronic effusions in lower extremity edema, swelling and weight are both down from baseline.  Her Lasix, spironolactone, and potassium were initially held secondary to dehydration from nausea vomiting and diarrhea. They may be resumed at discharge since her symptoms have improved.  Diabetes mellitus type 2, CBG well controlled.  Metformin discontinued secondary to lactic acidosis.  Last A1c was 5.7.  Goal A1c is closer to 7.5 for this 78 yo F with multiple comorbidities.  Will not restart her diabetes medication and ask that PCP repeat A1c soon.    Anxiety, stable. Continue Xanax and gabapentin.  Hyponatremia and hypochloremia due to GI  losses/tea/toast diet/dehydration, improved with hydration.   Hyperkalemia, mild, asymptomatic. Per chart review has intermittent mild hyperkalemia. Resolved.   Iron deficiency anemia, hemoglobin at baseline and stable.  Restart oral iron at discharge.  Thrombocytopenia, mild and likely acute phase reactant, stable.   Generalized weakness.  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  Advance diet as tolerated and add supplements.     Consultants:  Dr. Laural Golden, GI Procedures:  CT abd/pelvis  CXR Colonoscopy 4/9 with dilation of colonic stricture Antibiotics:  none   Discharge Exam: Filed Vitals:   09/08/13 0436  BP: 133/73  Pulse: 100  Temp: 97.6 F (36.4 C)  Resp: 18   Filed Vitals:   09/07/13 1655 09/07/13 1700 09/07/13 2233 09/08/13 0436  BP: 119/58 111/54 119/66 133/73  Pulse: 84 90 86 100  Temp:   97.6 F (36.4 C) 97.6 F (36.4 C)  TempSrc:   Oral Oral  Resp: 12 17 18 18   Height:      Weight:    83.2 kg (183 lb 6.8 oz)  SpO2: 99% 99% 95% 92%    General: Obese Caucasian female, No acute distress  HEENT: NCAT, MMM  Cardiovascular: IRRR, nl S1, S2 no mrg,  2+ pulses, warm extremities  Respiratory: CTAB, no increased WOB  Abdomen: Normal active bowel sounds, mildly distended, nontender  MSK: Normal tone and bulk, 1+ bilateral LEE  Neuro: Grossly intact   Discharge Instructions      Discharge Orders   Future Appointments Provider Department Dept Phone   09/12/2013 2:30 PM Malissa Hippo, MD [facility] [phone]   09/27/2013 8:30 AM Ernestina Penna, MD [facility] [phone]   11/27/2013 3:00 PM Malissa Hippo, MD [facility] [phone]   Future Orders Complete By Expires   Call MD for:  difficulty breathing, headache or visual disturbances  As directed    Call MD for:  extreme fatigue  As directed    Call MD  for:  hives  As directed    Call MD for:  persistant dizziness or light-headedness  As directed    Call MD for:  persistant nausea and vomiting  As directed    Call MD for:  severe uncontrolled pain  As directed    Call MD for:  temperature >100.4  As directed    Diet general  As directed    Discharge instructions  As directed    Driving Restrictions  As directed    Increase activity slowly  As directed        Medication List    STOP taking these medications       metFORMIN 1000 MG tablet  Commonly known as:  GLUCOPHAGE     potassium chloride 10 MEQ tablet  Commonly known as:  K-DUR     warfarin 1 MG tablet  Commonly known as:  COUMADIN      TAKE these medications       acetaminophen 500 MG tablet  Commonly known as:  TYLENOL  Take 500 mg by mouth every 8 (eight) hours as needed. Pain     ALPRAZolam 0.5 MG tablet  Commonly known as:  XANAX  Take 0.25-0.5 mg by mouth daily.     aspirin EC 81 MG tablet  Take 1 tablet (81 mg total) by mouth daily.     diltiazem 180 MG 24 hr capsule  Commonly known as:  CARDIZEM CD  Take 180 mg by mouth daily.     feeding supplement (ENSURE COMPLETE) Liqd  Take 237 mLs by mouth 2 (two) times daily between meals.     ferrous sulfate 325 (65 FE) MG tablet  Take 325 mg by mouth. Patient takes this medication 3 times per week     furosemide 20 MG tablet  Commonly known as:  LASIX  Take 20 mg by mouth daily.     gabapentin 100 MG capsule  Commonly known as:  NEURONTIN  Take 100 mg by mouth at bedtime.     HYDROcodone-acetaminophen 5-325 MG per tablet  Commonly known as:  NORCO  1-2 tabs po q6 hours prn pain     lansoprazole 30 MG capsule  Commonly known as:  PREVACID  Take 30 mg by mouth daily as needed (for acid reflux).     mesalamine 500 MG CR capsule  Commonly known as:  PENTASA  Take 2 capsules (1,000 mg total) by mouth 3 (three) times daily.     metroNIDAZOLE 250 MG tablet  Commonly known as:  FLAGYL  Take 1 tablet  (250 mg total) by mouth 3 (three) times daily after meals.     multivitamin tablet  Take 1 tablet by mouth daily.  ondansetron 4 MG tablet  Commonly known as:  ZOFRAN  Take 1 tablet (4 mg total) by mouth every 8 (eight) hours as needed for nausea or vomiting.     polyethylene glycol packet  Commonly known as:  MIRALAX  Take 17 g by mouth daily as needed for mild constipation.     predniSONE 5 MG tablet  Commonly known as:  DELTASONE  Take 6 tablets (30 mg total) by mouth daily with breakfast.     spironolactone 50 MG tablet  Commonly known as:  ALDACTONE  Take 50 mg by mouth 2 (two) times daily.     vitamin C 500 MG tablet  Commonly known as:  ASCORBIC ACID  Take 500 mg by mouth daily.     Vitamin D (Ergocalciferol) 50000 UNITS Caps capsule  Commonly known as:  DRISDOL  Take 1 capsule (50,000 Units total) by mouth every 7 (seven) days.       Follow-up Information   Follow up with Rogene Houston, MD On 09/12/2013. (already schedule appointment)    Specialty:  Gastroenterology   Contact information:   [address], [address] [place] [address] [phone]        The results of significant diagnostics from this hospitalization (including imaging, microbiology, ancillary and laboratory) are listed below for reference.    Significant Diagnostic Studies: Dg Chest 2 View  09/05/2013   CLINICAL DATA:  Epigastric pain  EXAM: CHEST  2 VIEW  COMPARISON:  08/08/2013  FINDINGS: Cardiomediastinal silhouette is stable. No acute infiltrate or pleural effusion. No pulmonary edema. Mild degenerative changes thoracic spine.  IMPRESSION: No active cardiopulmonary disease.   Electronically Signed   By: Lahoma Crocker M.D.   On: 09/05/2013 15:37   Ct Abdomen Pelvis W Contrast  09/05/2013   CLINICAL DATA:  Abdominal pain, third CT scan in 4 weeks (ordering physician aware), history of crohn's disease, CHF, cirrhosis  EXAM: CT ABDOMEN AND PELVIS WITH CONTRAST  TECHNIQUE: Multidetector CT  imaging of the abdomen and pelvis was performed using the standard protocol following bolus administration of intravenous contrast.  CONTRAST:  159mL OMNIPAQUE IOHEXOL 300 MG/ML  SOLN  COMPARISON:  CT ABD - PELV W/ CM dated 08/17/2013; CT ABD - PELV W/ CM dated 08/08/2013  FINDINGS: Small right and minimal left pleural effusions similar to prior study.  Small nodular liver consistent with cirrhosis. Spleen is normal. Splenorenal varices throughout the left flank again identified as well as esophageal and anterior body wall varices. Focal dilatation left renal vein in the region of the left renal hilum, to 3.2 cm, stable. Kidneys normal. Adrenal glands normal. Pancreas head 4mm calcification stable.  Numerous surgical clips seen throughout the mid abdomen and left flank, unchanged. Numerous enlarged mesenteric lymph nodes in the midline, measuring up to 14 mm in Rickardo Brinegar axis, stable. Despite the presence of focal narrowing involving the distal transverse colon, which is stable, there is a nonobstructive bowel gas pattern.  There is a small volume of ascites in the abdomen and pelvis. There is evidence of anasarca  The bladder is normal. In the left adnexa there is a 38 x 40 mm cystic lesion, unchanged from prior study.  There are no acute musculoskeletal findings.  IMPRESSION: 1. Stable findings of cirrhosis with varices, ascites, and pleural effusions 2. Persistent focal narrowing distal transverse colon possibly related to Crohn's disease. Malignancy not entirely excluded. Nonobstructive bowel gas pattern with contrast into the rectum. 3. Persistent left ovarian cyst, malignancy not excluded.  Electronically Signed   By: Skipper Cliche M.D.   On: 09/05/2013 15:53   Ct Abdomen Pelvis W Contrast  08/17/2013   CLINICAL DATA:  Vomiting for 3 weeks, abdominal pain, history Crohn's disease, diabetes, hypertension, CHF, atrial fibrillation, COPD, cirrhosis  EXAM: CT ABDOMEN AND PELVIS WITH CONTRAST  TECHNIQUE:  Multidetector CT imaging of the abdomen and pelvis was performed using the standard protocol following bolus administration of intravenous contrast. Sagittal and coronal MPR images reconstructed from axial data set.  CONTRAST:  125mL OMNIPAQUE IOHEXOL 300 MG/ML SOLN. Dilute oral contrast.  COMPARISON:  08/08/2013  FINDINGS: Small bibasilar pleural effusions greater on right with adjacent compressive atelectasis of the lower lobes.  Paraesophageal varices.  Cirrhotic appearing liver with ascites.  Perisplenic collaterals noted secondary to spontaneous splenorenal shunt.  Additional collaterals in anterior wall and periumbilical region (caput medusa).  Single stable calcification at pancreatic head.  No additional focal abnormalities of the liver, spleen, pancreas, kidneys, or adrenal glands.  Scattered surgical clips in upper abdomen post ileocolic resection.  Bowel wall thickening of mid transverse colon question related to Crohn's disease, similar distribution to prior study.  Scattered normal sized to minimally enlarged mediastinal and retroperitoneal lymph nodes.  Stomach and small bowel loops normal appearance.  Left ovarian cyst 4.3 x 4.0 cm.  No additional mass, adenopathy, free air or hernia.  No acute osseous findings.  Scattered soft tissue edema/anasarca.  IMPRESSION: Post ileocolic resection with segmental wall thickening of the mid transverse colon, question related to Crohn's disease or other colitis such as infection or ischemia ; tumor consider less likely due to the length of involvement but not entirely excluded.  Consider follow-up colonoscopy if this fails to resolve.  Nonspecific adenopathy.  Cirrhotic liver with upper abdominal collaterals, ascites, and a spontaneous splenorenal shunt.  Left ovarian cyst mildly increased in size since previous exam.   Electronically Signed   By: Lavonia Dana M.D.   On: 08/17/2013 17:04    Microbiology: Recent Results (from the past 240 hour(s))  URINE  CULTURE     Status: None   Collection Time    09/05/13  1:51 PM      Result Value Ref Range Status   Specimen Description URINE, CLEAN CATCH   Final   Special Requests NONE   Final   Culture  Setup Time     Final   Value: 09/05/2013 23:25     Performed at [facility]     Final   Value: >=100,000 COLONIES/ML     Performed at [facility]   Culture     Final   Value: Multiple bacterial morphotypes present, none predominant. Suggest appropriate recollection if clinically indicated.     Performed at [facility]   Report Status 09/06/2013 FINAL   Final     Labs: Basic Metabolic Panel:  Recent Labs Lab 09/05/13 1334 09/06/13 0641 09/07/13 0730 09/08/13 0622  NA 126* 130* 131* 135*  K 5.0 5.4* 4.8 4.7  CL 87* 94* 96 98  CO2 25 29 30  33*  GLUCOSE 142* 138* 144* 126*  BUN 19 19 16 16   CREATININE 1.02 1.03 0.96 0.88  CALCIUM 10.1 9.6 9.8 9.7   Liver Function Tests:  Recent Labs Lab 09/05/13 1334  AST 39*  ALT 47*  ALKPHOS 82  BILITOT 2.1*  PROT 7.1  ALBUMIN 3.0*    Recent Labs Lab 09/05/13 1334  LIPASE 54   No results found for  this basename: AMMONIA,  in the last 168 hours CBC:  Recent Labs Lab 09/05/13 1334 09/06/13 0641 09/07/13 0730 09/08/13 0622  WBC 8.9 5.9 8.0 7.0  NEUTROABS 8.1*  --   --   --   HGB 11.6* 9.2* 9.6* 9.9*  HCT 36.3 28.9* 30.3* 31.4*  MCV 79.4 79.8 79.7 81.1  PLT 164 128* 136* 136*   Cardiac Enzymes:  Recent Labs Lab 09/05/13 1334  TROPONINI <0.30   BNP: BNP (last 3 results) No results found for this basename: PROBNP,  in the last 8760 hours CBG:  Recent Labs Lab 09/07/13 0740 09/07/13 1108 09/07/13 2239 09/08/13 0813 09/08/13 1119  GLUCAP 125* 166* 209* 128* 246*    Time coordinating discharge: 45 minutes  Signed:  Janece Canterbury  [facility] 09/08/2013, 3:18 PM

## 2013-09-08 NOTE — Progress Notes (Signed)
Patient ID: [name], female   DOB: [birth year], 78 y.o.   MRN: [id number] In bed eating breakfast. Says she feels okay.  She says she is hungry. Slight nausea. No BM today. Underwent a colonoscopy yesterday. Strictures dilated.  Filed Vitals:   09/07/13 1655 09/07/13 1700 09/07/13 2233 09/08/13 0436  BP: 119/58 111/54 119/66 133/73  Pulse: 84 90 86 100  Temp:   97.6 F (36.4 C) 97.6 F (36.4 C)  TempSrc:   Oral Oral  Resp: 12 17 18 18   Height:      Weight:    183 lb 6.8 oz (83.2 kg)  SpO2: 99% 99% 95% 92%   Colonoscopy/Dilatation.  Impression:  Ileocolonic stricture with inflammatory changes secondary to Crohn's disease.  This stricture was dilated with a balloon to 15 mm.  Anorectal stricture without inflammatory changes. This stricture was dilated to 15 mm.  Assessment;Ileocolonic stricture with inflammatory changes secondary to Crohn's disease.  Anorectal stricture without inflammatory changes. This stricture was dilated to 15 mm.  Recommendations: Advance to low fiber diet.

## 2013-09-08 NOTE — Progress Notes (Signed)
Patient with orders to be discharge. Discharge instructions given, patient verbalized understanding. Patient stable. PAtient left in private vehicle with family.

## 2013-09-08 NOTE — Progress Notes (Signed)
Patient has no complaints. She denies abdominal pain nausea or vomiting. She also denies rectal bleeding. Abdominal exam reveals full but soft abdomen with capot madusea and from this below the left costal margin which is a chronic finding. H&H is 9.9 and 31.4 and platelet count 136K INR is back up to 2.2; she therefore corrected minimally with FFP. Assessment; Patient appears to be doing well following dilation of ileocolonic and anorectal stricture due to Crohn's disease. H&H is low but stable and no evidence of GI bleeding. Recommendations; Would recommend switching her to prednisone 30 mg by mouth daily and continue metronidazole. Discussed with Dr.Short. Patient will be seen in the office on 09/12/2013. Will check CBC and INR at the time of office visit next week

## 2013-09-11 ENCOUNTER — Encounter (HOSPITAL_COMMUNITY): Payer: Self-pay | Admitting: Internal Medicine

## 2013-09-11 NOTE — Progress Notes (Signed)
UR chart review completed.  

## 2013-09-12 ENCOUNTER — Encounter (INDEPENDENT_AMBULATORY_CARE_PROVIDER_SITE_OTHER): Payer: Self-pay | Admitting: Internal Medicine

## 2013-09-12 ENCOUNTER — Ambulatory Visit (INDEPENDENT_AMBULATORY_CARE_PROVIDER_SITE_OTHER): Payer: Medicare Other | Admitting: Internal Medicine

## 2013-09-12 VITALS — BP 114/74 | HR 78 | Temp 97.2°F | Resp 18 | Ht 59.0 in | Wt 184.2 lb

## 2013-09-12 DIAGNOSIS — Z7901 Long term (current) use of anticoagulants: Secondary | ICD-10-CM

## 2013-09-12 DIAGNOSIS — K5080 Crohn's disease of both small and large intestine without complications: Secondary | ICD-10-CM

## 2013-09-12 DIAGNOSIS — R5383 Other fatigue: Secondary | ICD-10-CM

## 2013-09-12 DIAGNOSIS — K7460 Unspecified cirrhosis of liver: Secondary | ICD-10-CM

## 2013-09-12 DIAGNOSIS — R5381 Other malaise: Secondary | ICD-10-CM

## 2013-09-12 DIAGNOSIS — R531 Weakness: Secondary | ICD-10-CM

## 2013-09-12 DIAGNOSIS — D649 Anemia, unspecified: Secondary | ICD-10-CM

## 2013-09-12 LAB — ALBUMIN: ALBUMIN: 3 g/dL — AB (ref 3.5–5.2)

## 2013-09-12 LAB — PREALBUMIN: Prealbumin: 16.1 mg/dL — ABNORMAL LOW (ref 17.0–34.0)

## 2013-09-12 LAB — BASIC METABOLIC PANEL
BUN: 19 mg/dL (ref 6–23)
CHLORIDE: 94 meq/L — AB (ref 96–112)
CO2: 25 mEq/L (ref 19–32)
Calcium: 10 mg/dL (ref 8.4–10.5)
Creat: 1.01 mg/dL (ref 0.50–1.10)
GLUCOSE: 143 mg/dL — AB (ref 70–99)
POTASSIUM: 6 meq/L — AB (ref 3.5–5.3)
Sodium: 130 mEq/L — ABNORMAL LOW (ref 135–145)

## 2013-09-12 NOTE — Progress Notes (Signed)
Presenting complaint;  Followup for Crohn's disease.  Subjective:  Patient is 78 year old Caucasian female with multiple medical problems who was hospitalized last week for flare up of Crohn's disease. She was treated with IV steroids. She underwent colonoscopy with dilation of ileocolonic and anorectal stricture. She was discharged on 09/08/2013. She feels better. She is still having some gas pains and and has diarrhea but no more than 2-3 stools per day. Her appetite is getting better. She has not experienced nausea vomiting melena or rectal bleeding. She feels weak but she denies shortness of breath. She is trying to walk more than she has in the past. She is not having any side effects with prednisone. Her glucose levels been running in low 100.   Current Medications: Outpatient Encounter Prescriptions as of 09/12/2013  Medication Sig  . acetaminophen (TYLENOL) 500 MG tablet Take 500 mg by mouth every 8 (eight) hours as needed. Pain  . ALPRAZolam (XANAX) 0.5 MG tablet Take 0.25-0.5 mg by mouth daily.  Marland Kitchen aspirin EC 81 MG tablet Take 1 tablet (81 mg total) by mouth daily.  Marland Kitchen diltiazem (CARDIZEM CD) 180 MG 24 hr capsule Take 180 mg by mouth daily.  . feeding supplement, ENSURE COMPLETE, (ENSURE COMPLETE) LIQD Take 237 mLs by mouth 2 (two) times daily between meals.  . ferrous sulfate 325 (65 FE) MG tablet Take 325 mg by mouth. Patient takes this medication 3 times per week  . furosemide (LASIX) 20 MG tablet Take 20 mg by mouth daily.  Marland Kitchen gabapentin (NEURONTIN) 100 MG capsule Take 100 mg by mouth at bedtime.  Marland Kitchen HYDROcodone-acetaminophen (NORCO) 5-325 MG per tablet 1-2 tabs po q6 hours prn pain  . lansoprazole (PREVACID) 30 MG capsule Take 30 mg by mouth daily as needed (for acid reflux).   . mesalamine (PENTASA) 500 MG CR capsule Take 2 capsules (1,000 mg total) by mouth 3 (three) times daily.  . metroNIDAZOLE (FLAGYL) 250 MG tablet Take 1 tablet (250 mg total) by mouth 3 (three) times daily  after meals.  . Multiple Vitamin (MULTIVITAMIN) tablet Take 1 tablet by mouth daily.    . ondansetron (ZOFRAN) 4 MG tablet Take 1 tablet (4 mg total) by mouth every 8 (eight) hours as needed for nausea or vomiting.  . polyethylene glycol (MIRALAX) packet Take 17 g by mouth daily as needed for mild constipation.  . predniSONE (DELTASONE) 5 MG tablet Take 6 tablets (30 mg total) by mouth daily with breakfast.  . spironolactone (ALDACTONE) 50 MG tablet Take 50 mg by mouth 2 (two) times daily.  . vitamin C (ASCORBIC ACID) 500 MG tablet Take 500 mg by mouth daily.  . Vitamin D, Ergocalciferol, (DRISDOL) 50000 UNITS CAPS capsule Take 1 capsule (50,000 Units total) by mouth every 7 (seven) days.     Objective: Blood pressure 114/74, pulse 78, temperature 97.2 F (36.2 C), temperature source Oral, resp. rate 18, height 4\' 11"  (1.499 m), weight 184 lb 3.2 oz (83.553 kg). Patient is alert and in no acute distress. Conjunctiva is pink. Sclera is nonicteric Oropharyngeal mucosa is normal. No neck masses or thyromegaly noted. Cardiac exam with irregular rhythm normal S1 and S2. No murmur or gallop noted. Lungs are clear to auscultation. Abdomen is full but less distended than it was during recent hospitalization. She has capot medusae. Bowel sounds are normal. On palpation abdomen is soft. She remains with area of fullness or firmness in left upper quadrant with mild tenderness. No organomegaly or masses. No LE edema or clubbing  noted.   Assessment:  #1. Ileal and anorectal Crohn disease. In the last 6 months she's been hospitalized 4 times. On her last admission she underwent balloon dilation of ileocolonic and anorectal anastomotic stricture. She has mild symptoms while in prednisone and metronidazole. She unfortunately is not a candidate for other therapies given cirrhosis and cardiomyopathy. #2. Cirrhosis secondary to NAFLD. Recent CT showed mild ascites but so far they have no other sequelae. #3.  Anemia.  Plan:  Patient will go the lab for CBC, metabolic 7, serum albumin and prealbumin. She will also have INR. Will make recommendations about prednisone taper once blood work is reviewed. Office visit in one month.

## 2013-09-12 NOTE — Patient Instructions (Addendum)
Physician will call with the results of blood test. Continue metronidazole until prescription runs out. Call if skin rash worsens

## 2013-09-13 ENCOUNTER — Other Ambulatory Visit (INDEPENDENT_AMBULATORY_CARE_PROVIDER_SITE_OTHER): Payer: Self-pay | Admitting: Internal Medicine

## 2013-09-13 DIAGNOSIS — E875 Hyperkalemia: Secondary | ICD-10-CM

## 2013-09-13 LAB — CBC
HEMATOCRIT: 37.3 % (ref 36.0–46.0)
HEMOGLOBIN: 11.8 g/dL — AB (ref 12.0–15.0)
MCH: 24.7 pg — ABNORMAL LOW (ref 26.0–34.0)
MCHC: 31.6 g/dL (ref 30.0–36.0)
MCV: 78 fL (ref 78.0–100.0)
Platelets: 154 10*3/uL (ref 150–400)
RBC: 4.78 MIL/uL (ref 3.87–5.11)
RDW: 18.7 % — ABNORMAL HIGH (ref 11.5–15.5)
WBC: 11.1 10*3/uL — AB (ref 4.0–10.5)

## 2013-09-13 LAB — PROTIME-INR
INR: 2.09 — ABNORMAL HIGH (ref <1.50)
Prothrombin Time: 23 seconds — ABNORMAL HIGH (ref 11.6–15.2)

## 2013-09-13 MED ORDER — SODIUM POLYSTYRENE SULFONATE PO POWD: Freq: Once | ORAL | Status: DC

## 2013-09-14 ENCOUNTER — Telehealth: Payer: Self-pay | Admitting: Family Medicine

## 2013-09-14 ENCOUNTER — Other Ambulatory Visit: Payer: Medicare Other

## 2013-09-14 ENCOUNTER — Telehealth (INDEPENDENT_AMBULATORY_CARE_PROVIDER_SITE_OTHER): Payer: Self-pay | Admitting: *Deleted

## 2013-09-14 ENCOUNTER — Ambulatory Visit (INDEPENDENT_AMBULATORY_CARE_PROVIDER_SITE_OTHER): Payer: Medicare Other | Admitting: Pharmacist

## 2013-09-14 DIAGNOSIS — E875 Hyperkalemia: Secondary | ICD-10-CM

## 2013-09-14 DIAGNOSIS — I4891 Unspecified atrial fibrillation: Secondary | ICD-10-CM

## 2013-09-14 DIAGNOSIS — E119 Type 2 diabetes mellitus without complications: Secondary | ICD-10-CM

## 2013-09-14 DIAGNOSIS — K5090 Crohn's disease, unspecified, without complications: Secondary | ICD-10-CM

## 2013-09-14 LAB — POCT INR: INR: 2.1

## 2013-09-14 MED ORDER — HYDROCODONE-ACETAMINOPHEN 5-325 MG PO TABS
1.0000 | ORAL_TABLET | Freq: Four times a day (QID) | ORAL | Status: AC | PRN
Start: 2013-09-14 — End: ?

## 2013-09-14 NOTE — Progress Notes (Signed)
Pt came in for labs only 

## 2013-09-14 NOTE — Addendum Note (Signed)
Addended by: Ilean China on: 09/14/2013 12:34 PM   Modules accepted: Orders

## 2013-09-14 NOTE — Telephone Encounter (Signed)
Med was filled on 09/08/13 by a Dr Sheran Fava This was a hospitalist - and the paper RX was not handed to her- pt will look through her paperwork and make sure it is not in there. She wil contact Dawnette Mione B if she needs this filled.

## 2013-09-14 NOTE — Telephone Encounter (Signed)
[facility] called spoke with Shirlean Mylar. Explained that the patient was recently discharged from hospital, at [place] this week B-Met drawn, Potasium high. Last night,09/13/13, Dr.Rehman called in the [place],patient to have repeat B-Met today. Due to her having a hard time ambulating,pain, we have ask that her PCP drawn this lab. Also , the patient is requesting a refill on her Hydrocodone.Shirlean Mylar has completed a refill request and gave an appointment for her to have lab drawn at 2:15 pm today. Lab request faxed. Patient called and made aware.

## 2013-09-14 NOTE — Telephone Encounter (Signed)
This is okay to refill the hydrocodone but only one tablet every 6 hours

## 2013-09-14 NOTE — Telephone Encounter (Signed)
Per Dr.Rehman the patient will need to have labs drawn. We will try and have them drawn at [place] office.

## 2013-09-14 NOTE — Patient Instructions (Signed)
Anticoagulation Dose Instructions as of 09/14/2013     [name] Tue Wed Thu Fri Sat   New Dose 0.5 mg 0.5 mg 1 mg 0.5 mg 1 mg 0.5 mg 0.5 mg    Description       Continue warfarin dose of 1/2 tablet daily except 1 tablet on tuesdays and thursdays.      INR was 2.1 today

## 2013-09-15 LAB — BMP8+EGFR
BUN/Creatinine Ratio: 17 (ref 11–26)
BUN: 17 mg/dL (ref 8–27)
CALCIUM: 9.7 mg/dL (ref 8.7–10.3)
CHLORIDE: 89 mmol/L — AB (ref 97–108)
CO2: 25 mmol/L (ref 18–29)
Creatinine, Ser: 1.02 mg/dL — ABNORMAL HIGH (ref 0.57–1.00)
GFR calc Af Amer: 62 mL/min/{1.73_m2} (ref >59)
GFR, EST NON AFRICAN AMERICAN: 54 mL/min/{1.73_m2} — AB (ref >59)
GLUCOSE: 125 mg/dL — AB (ref 65–99)
Potassium: 4.5 mmol/L (ref 3.5–5.2)
Sodium: 131 mmol/L — ABNORMAL LOW (ref 134–144)

## 2013-09-21 ENCOUNTER — Ambulatory Visit (INDEPENDENT_AMBULATORY_CARE_PROVIDER_SITE_OTHER): Payer: Medicare Other | Admitting: Pharmacist

## 2013-09-21 DIAGNOSIS — I4891 Unspecified atrial fibrillation: Secondary | ICD-10-CM

## 2013-09-21 LAB — POCT INR: INR: 6.1

## 2013-09-21 NOTE — Progress Notes (Signed)
INR supratherapeutic due to new start of Flagyl and Prednisone.   Hold warfarin for next 2 days - recheck INR tomorrow.  Ordered send out INR and CBC check to be drawn today by Kenney Houseman with [facility].

## 2013-09-22 ENCOUNTER — Ambulatory Visit (INDEPENDENT_AMBULATORY_CARE_PROVIDER_SITE_OTHER): Payer: Medicare Other | Admitting: Nurse Practitioner

## 2013-09-22 ENCOUNTER — Emergency Department (HOSPITAL_COMMUNITY): Payer: Medicare Other

## 2013-09-22 ENCOUNTER — Encounter (HOSPITAL_COMMUNITY): Payer: Self-pay | Admitting: Emergency Medicine

## 2013-09-22 ENCOUNTER — Encounter: Payer: Self-pay | Admitting: Nurse Practitioner

## 2013-09-22 ENCOUNTER — Emergency Department (HOSPITAL_COMMUNITY)
Admission: EM | Admit: 2013-09-22 | Discharge: 2013-09-22 | Disposition: A | Discharge status: Home / Self Care | Payer: Medicare Other | Attending: Emergency Medicine | Admitting: Emergency Medicine

## 2013-09-22 VITALS — BP 136/74 | HR 150 | Temp 97.8°F | Ht 59.0 in | Wt 184.0 lb

## 2013-09-22 DIAGNOSIS — I509 Heart failure, unspecified: Secondary | ICD-10-CM | POA: Insufficient documentation

## 2013-09-22 DIAGNOSIS — Z79899 Other long term (current) drug therapy: Secondary | ICD-10-CM | POA: Insufficient documentation

## 2013-09-22 DIAGNOSIS — Z87891 Personal history of nicotine dependence: Secondary | ICD-10-CM | POA: Insufficient documentation

## 2013-09-22 DIAGNOSIS — H5440 Blindness, one eye, unspecified eye: Secondary | ICD-10-CM | POA: Insufficient documentation

## 2013-09-22 DIAGNOSIS — I499 Cardiac arrhythmia, unspecified: Secondary | ICD-10-CM

## 2013-09-22 DIAGNOSIS — D649 Anemia, unspecified: Secondary | ICD-10-CM | POA: Insufficient documentation

## 2013-09-22 DIAGNOSIS — Z8669 Personal history of other diseases of the nervous system and sense organs: Secondary | ICD-10-CM | POA: Insufficient documentation

## 2013-09-22 DIAGNOSIS — Z7982 Long term (current) use of aspirin: Secondary | ICD-10-CM | POA: Insufficient documentation

## 2013-09-22 DIAGNOSIS — Z872 Personal history of diseases of the skin and subcutaneous tissue: Secondary | ICD-10-CM | POA: Insufficient documentation

## 2013-09-22 DIAGNOSIS — K219 Gastro-esophageal reflux disease without esophagitis: Secondary | ICD-10-CM | POA: Insufficient documentation

## 2013-09-22 DIAGNOSIS — D6832 Hemorrhagic disorder due to extrinsic circulating anticoagulants: Secondary | ICD-10-CM

## 2013-09-22 DIAGNOSIS — I498 Other specified cardiac arrhythmias: Secondary | ICD-10-CM | POA: Insufficient documentation

## 2013-09-22 DIAGNOSIS — IMO0002 Reserved for concepts with insufficient information to code with codable children: Secondary | ICD-10-CM | POA: Insufficient documentation

## 2013-09-22 DIAGNOSIS — G609 Hereditary and idiopathic neuropathy, unspecified: Secondary | ICD-10-CM | POA: Insufficient documentation

## 2013-09-22 DIAGNOSIS — R791 Abnormal coagulation profile: Secondary | ICD-10-CM

## 2013-09-22 DIAGNOSIS — I4891 Unspecified atrial fibrillation: Secondary | ICD-10-CM

## 2013-09-22 DIAGNOSIS — J4489 Other specified chronic obstructive pulmonary disease: Secondary | ICD-10-CM | POA: Insufficient documentation

## 2013-09-22 DIAGNOSIS — J449 Chronic obstructive pulmonary disease, unspecified: Secondary | ICD-10-CM | POA: Insufficient documentation

## 2013-09-22 DIAGNOSIS — T45515A Adverse effect of anticoagulants, initial encounter: Secondary | ICD-10-CM

## 2013-09-22 DIAGNOSIS — J9610 Chronic respiratory failure, unspecified whether with hypoxia or hypercapnia: Secondary | ICD-10-CM | POA: Insufficient documentation

## 2013-09-22 DIAGNOSIS — I10 Essential (primary) hypertension: Secondary | ICD-10-CM | POA: Insufficient documentation

## 2013-09-22 DIAGNOSIS — M7989 Other specified soft tissue disorders: Secondary | ICD-10-CM | POA: Insufficient documentation

## 2013-09-22 DIAGNOSIS — F411 Generalized anxiety disorder: Secondary | ICD-10-CM | POA: Insufficient documentation

## 2013-09-22 DIAGNOSIS — E119 Type 2 diabetes mellitus without complications: Secondary | ICD-10-CM | POA: Insufficient documentation

## 2013-09-22 DIAGNOSIS — M129 Arthropathy, unspecified: Secondary | ICD-10-CM | POA: Insufficient documentation

## 2013-09-22 DIAGNOSIS — D689 Coagulation defect, unspecified: Secondary | ICD-10-CM | POA: Insufficient documentation

## 2013-09-22 DIAGNOSIS — R000 Tachycardia, unspecified: Secondary | ICD-10-CM | POA: Insufficient documentation

## 2013-09-22 LAB — PROTIME-INR
INR: 6.78 — AB (ref 0.00–1.49)
INR: 7.1 — AB (ref <=1.1)
PROTHROMBIN TIME: 56.1 s — AB (ref 11.6–15.2)

## 2013-09-22 LAB — COMPREHENSIVE METABOLIC PANEL
ALBUMIN: 2.8 g/dL — AB (ref 3.5–5.2)
ALK PHOS: 89 U/L (ref 39–117)
ALT: 38 U/L — AB (ref 0–35)
AST: 28 U/L (ref 0–37)
BUN: 23 mg/dL (ref 6–23)
CALCIUM: 9.8 mg/dL (ref 8.4–10.5)
CO2: 23 mEq/L (ref 19–32)
Chloride: 88 mEq/L — ABNORMAL LOW (ref 96–112)
Creatinine, Ser: 1.04 mg/dL (ref 0.50–1.10)
GFR calc non Af Amer: 51 mL/min — ABNORMAL LOW (ref >90)
GFR, EST AFRICAN AMERICAN: 59 mL/min — AB (ref >90)
Glucose, Bld: 136 mg/dL — ABNORMAL HIGH (ref 70–99)
POTASSIUM: 5 meq/L (ref 3.7–5.3)
Sodium: 127 mEq/L — ABNORMAL LOW (ref 137–147)
Total Bilirubin: 2.2 mg/dL — ABNORMAL HIGH (ref 0.3–1.2)
Total Protein: 6.1 g/dL (ref 6.0–8.3)

## 2013-09-22 LAB — CBC WITH DIFFERENTIAL/PLATELET
BASOS ABS: 0 10*3/uL (ref 0.0–0.1)
BASOS PCT: 0 % (ref 0–1)
EOS ABS: 0 10*3/uL (ref 0.0–0.7)
EOS PCT: 0 % (ref 0–5)
HCT: 35 % — ABNORMAL LOW (ref 36.0–46.0)
Hemoglobin: 11.2 g/dL — ABNORMAL LOW (ref 12.0–15.0)
Lymphocytes Relative: 6 % — ABNORMAL LOW (ref 12–46)
Lymphs Abs: 0.7 10*3/uL (ref 0.7–4.0)
MCH: 25.3 pg — AB (ref 26.0–34.0)
MCHC: 32 g/dL (ref 30.0–36.0)
MCV: 79 fL (ref 78.0–100.0)
Monocytes Absolute: 0.4 10*3/uL (ref 0.1–1.0)
Monocytes Relative: 3 % (ref 3–12)
Neutro Abs: 9.9 10*3/uL — ABNORMAL HIGH (ref 1.7–7.7)
Neutrophils Relative %: 91 % — ABNORMAL HIGH (ref 43–77)
PLATELETS: 149 10*3/uL — AB (ref 150–400)
RBC: 4.43 MIL/uL (ref 3.87–5.11)
RDW: 19.5 % — AB (ref 11.5–15.5)
WBC: 11 10*3/uL — ABNORMAL HIGH (ref 4.0–10.5)

## 2013-09-22 LAB — PRO B NATRIURETIC PEPTIDE: Pro B Natriuretic peptide (BNP): 835.6 pg/mL — ABNORMAL HIGH (ref 0–450)

## 2013-09-22 LAB — TROPONIN I: Troponin I: <0.3 ng/mL (ref <0.30)

## 2013-09-22 MED ORDER — DILTIAZEM HCL 25 MG/5ML IV SOLN
10.0000 mg | Freq: Once | INTRAVENOUS | Status: AC
  Administered 2013-09-22: 10 mg via INTRAVENOUS

## 2013-09-22 MED ORDER — PHYTONADIONE 5 MG PO TABS
2.5000 mg | ORAL_TABLET | Freq: Once | ORAL | Status: AC
  Administered 2013-09-22: 2.5 mg via ORAL

## 2013-09-22 MED ORDER — PHYTONADIONE 5 MG PO TABS
5.0000 mg | ORAL_TABLET | Freq: Once | ORAL | Status: AC
  Administered 2013-09-22: 5 mg via ORAL
  Filled 2013-09-22: qty 1

## 2013-09-22 MED ORDER — DILTIAZEM HCL 100 MG IV SOLR
5.0000 mg/h | Freq: Once | INTRAVENOUS | Status: AC
  Administered 2013-09-22: 5 mg/h via INTRAVENOUS
  Filled 2013-09-22: qty 100

## 2013-09-22 NOTE — Progress Notes (Signed)
   Subjective:    Patient ID: [name], female    DOB: September 26, 1935, 78 y.o.   MRN: [id number]  HPI Patient has been follow up with [facility] with elevated INR- home health called and said INR today is 7.1 today. Home health also sent her to the office with rapid heart rate- SHe has a history of atrial fib. Does have DOE.    Review of Systems  Constitutional: Negative.   HENT: Negative.   Respiratory: Positive for shortness of breath.   Cardiovascular: Negative.  Negative for chest pain, palpitations and leg swelling.  Genitourinary: Negative.   All other systems reviewed and are negative.      Objective:   Physical Exam  Constitutional: She is oriented to person, place, and time. She appears well-developed and well-nourished.  Cardiovascular:  Atrial fib  Pulmonary/Chest: Effort normal and breath sounds normal.  Abdominal: Soft.  Neurological: She is alert and oriented to person, place, and time.  Skin: Skin is warm and dry.  Psychiatric: She has a normal mood and affect. Her behavior is normal. Judgment and thought content normal.    BP 136/74  Pulse 150  Temp(Src) 97.8 F (36.6 C) (Oral)  Ht 4\' 11"  (1.499 m)  Wt 184 lb (83.462 kg)  BMI 37.14 kg/m2  EKG- atrial fib with rapid ventricular response      Assessment & Plan:   1. Elevated INR   2. Atrial fibrillation   3. Irregular heart beat    Meds ordered this encounter  Medications  . phytonadione (VITAMIN K) tablet 2.5 mg    Sig:    Was told to hold Coumadin until INR rechecked next week To ER via EMS  [place], FNP

## 2013-09-22 NOTE — ED Provider Notes (Signed)
CSN: [id number]     Arrival date & time 09/22/13  1514 History  This chart was scribed for Janice Norrie, MD by Zettie Pho, ED Scribe. This patient was seen in room APA12/APA12 and the patient's care was started at 3:41 PM.    Chief Complaint  Patient presents with  . Atrial Fibrillation   The history is provided by the patient. No language interpreter was used.   HPI Comments: [name] is a 78 y.o. Female with a history of chronic atrial fibrillation, CHF, COPD, chronic respiratory failure who presents to the Emergency Department complaining of atrial fibrillation onset earlier today. Patient was sent here from [place] where her home health nurse checked her INR, which was 7.1 today compared to 6.7 yesterday, so patient was sent to her PCP. Her PCP noticed that the patient was in atrial fibrillation while she was in the office, so she was given 2.5 mg of Vitamin K and was sent here. Patient states that she usually does not know when she is in atrial fibrillation, but denies history of prior hospitalizations for it. Patient reports swelling to the bilateral legs that she states is normal for her baseline and denies any recent changes. She reports some associated pain to the generalized abdomen yesterday, which resolved after taking hydrocodone at home. She denies shortness of breath, chest pain, diaphoresis, lightheadedness, dizziness, headache, hematuria, blood in the stool, epistaxis. Patient also has a history of HTN, right ventricular hypertrophy, anxiety, DM, cirrhosis, pleural effusion, anemia. Patient reports that she quit smoking.  Cardiologist- Dr. Percival Spanish PCP- Dr. Laurance Flatten   Past Medical History  Diagnosis Date  . Hypertension     x 5 years  . Paroxysmal atrial fibrillation   . Anxiety   . Crohn's disease   . 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 No clear documentation of this  . Diabetes mellitus   . Neuromuscular disorder     periph  neuropathy  . Shortness of breath   . Arthritis   . GERD (gastroesophageal reflux disease)   . Blood transfusion   . COPD (chronic obstructive pulmonary disease)   . Cirrhosis     non aloholic   . Pleural effusion 02/13/2012  . RVH (right ventricular hypertrophy) 02/13/2012  . Anemia 02/13/2012  . Chronic respiratory failure 02/15/2012    With hypoxia and hypercapnia.  . Anasarca 01/2012    Ejection fraction 60-65%  . CHF (congestive heart failure)   . Small bowel obstruction   . Umbilical hernia    Past Surgical History  Procedure Laterality Date  . Basal cell carcinoma excision    . Abdominal surgery      multiple  . Vesicovaginal fistula closure w/ tah    . Cancers resected    . Appendectomy    . Abdominal hysterectomy    . Tubal ligation    . Cholecystectomy    . Colon surgery      hx bleeding ulcer  . Mass excision  06/23/2011    Procedure: EXCISION MASS;  Surgeon: Tennis Must, MD;  Location: [facility];  Service: Orthopedics;  Laterality: Left;  left hand excision mass with acell placement  . Left hand  06/23/11    Removed Squamous cell , and also did graft  . Mass excision  11/02/2011    Procedure: EXCISION MASS;  Surgeon: Tennis Must, MD;  Location: [facility];  Service: Orthopedics;  Laterality: Left;  EXCISION  MASS LEFT HAND with full thickness skin graft from left upper arm  . Colonoscopy with esophagogastroduodenoscopy (egd) N/A 08/01/2012    Procedure: COLONOSCOPY WITH ESOPHAGOGASTRODUODENOSCOPY (EGD);  Surgeon: Rogene Houston, MD;  [facility];  Service: Endoscopy;  Laterality: N/A;  730  . Balloon dilation N/A 08/01/2012    Procedure: BALLOON DILATION;  Surgeon: Rogene Houston, MD;  [facility];  Service: Endoscopy;  Laterality: N/A;  Venia Minks dilation N/A 08/01/2012    Procedure: Venia Minks DILATION;  Surgeon: Rogene Houston, MD;  [facility];  Service: Endoscopy;  Laterality: N/A;  . Savory dilation  N/A 08/01/2012    Procedure: SAVORY DILATION;  Surgeon: Rogene Houston, MD;  [facility];  Service: Endoscopy;  Laterality: N/A;  . Colonoscopy N/A 09/07/2013    Procedure: COLONOSCOPY;  Surgeon: Rogene Houston, MD;  [facility];  Service: Endoscopy;  Laterality: N/A;  With stricture dilation    Family History  Problem Relation Age of Onset  . Coronary artery disease Father   . Heart attack Father   . Coronary artery disease Mother   . Coronary artery disease Brother   . Heart attack Brother   . Pancreatic cancer Brother   . Heart attack Brother   . Leukemia Daughter   . Healthy Daughter   . Colon cancer Neg Hx    History  Substance Use Topics  . Smoking status: Former Smoker -- 0.20 packs/day for 40 years    Types: Cigarettes    Start date: 08/29/2013  . Smokeless tobacco: Never Used  . Alcohol Use: No  lives at home Lives alone Daughter lives behind her   OB History   Grav Para Term Preterm Abortions TAB SAB Ect Mult Living                 Review of Systems  Constitutional: Negative for diaphoresis.  HENT: Negative for nosebleeds.   Respiratory: Negative for shortness of breath.   Cardiovascular: Positive for leg swelling (baseline, unchanged). Negative for chest pain.  Gastrointestinal: Positive for abdominal pain (resolved). Negative for blood in stool.  Genitourinary: Negative for hematuria.  Neurological: Negative for dizziness, light-headedness and headaches.  Hematological: Bruises/bleeds easily.  All other systems reviewed and are negative.     Allergies  Sulfonamide derivatives  Home Medications   Prior to Admission medications   Medication Sig Start Date End Date Taking? Authorizing Provider  acetaminophen (TYLENOL) 500 MG tablet Take 500 mg by mouth every 8 (eight) hours as needed. Pain    Historical Provider, MD  ALPRAZolam (XANAX) 0.5 MG tablet Take 0.25-0.5 mg by mouth daily.    Historical Provider, MD  aspirin EC 81 MG  tablet Take 1 tablet (81 mg total) by mouth daily. 09/08/13   Janece Canterbury, MD  diltiazem (CARDIZEM CD) 180 MG 24 hr capsule Take 180 mg by mouth daily.    Historical Provider, MD  feeding supplement, ENSURE COMPLETE, (ENSURE COMPLETE) LIQD Take 237 mLs by mouth 2 (two) times daily between meals. 09/08/13   Janece Canterbury, MD  ferrous sulfate 325 (65 FE) MG tablet Take 325 mg by mouth. Patient takes this medication 3 times per week    Historical Provider, MD  furosemide (LASIX) 20 MG tablet Take 20 mg by mouth daily.    Historical Provider, MD  gabapentin (NEURONTIN) 100 MG capsule Take 100 mg by mouth at bedtime.    Historical Provider, MD  HYDROcodone-acetaminophen ([place]) 5-325 MG  per tablet Take 1 tablet by mouth every 6 (six) hours as needed for moderate pain. 09/14/13   Chipper Herb, MD  lansoprazole (PREVACID) 30 MG capsule Take 30 mg by mouth daily as needed (for acid reflux).     Historical Provider, MD  mesalamine (PENTASA) 500 MG CR capsule Take 2 capsules (1,000 mg total) by mouth 3 (three) times daily. 03/28/13   Rogene Houston, MD  metroNIDAZOLE (FLAGYL) 250 MG tablet Take 1 tablet (250 mg total) by mouth 3 (three) times daily after meals. 09/08/13   Janece Canterbury, MD  Multiple Vitamin (MULTIVITAMIN) tablet Take 1 tablet by mouth daily.      Historical Provider, MD  ondansetron (ZOFRAN) 4 MG tablet Take 1 tablet (4 mg total) by mouth every 8 (eight) hours as needed for nausea or vomiting. 09/08/13   Janece Canterbury, MD  polyethylene glycol [facility]) packet Take 17 g by mouth daily as needed for mild constipation. 08/19/13   Samuella Cota, MD  predniSONE (DELTASONE) 5 MG tablet Take 6 tablets (30 mg total) by mouth daily with breakfast. 09/08/13   Janece Canterbury, MD  sodium polystyrene (KAYEXALATE) powder Take by mouth once. 25 g by mouth now and and second dose in a.m. 09/13/13   Rogene Houston, MD  spironolactone (ALDACTONE) 50 MG tablet Take 50 mg by mouth 2 (two) times daily.     Historical Provider, MD  vitamin C (ASCORBIC ACID) 500 MG tablet Take 500 mg by mouth daily.    Historical Provider, MD  Vitamin D, Ergocalciferol, (DRISDOL) 50000 UNITS CAPS capsule Take 1 capsule (50,000 Units total) by mouth every 7 (seven) days. 08/31/13   Chipper Herb, MD   Triage Vitals: BP 148/76  Pulse 134  Temp(Src) 97.9 F (36.6 C) (Oral)  Ht 4\' 11"  (1.499 m)  Wt 178 lb (80.74 kg)  BMI 35.93 kg/m2  SpO2 99%  Vital signs normal except tachycardia   Physical Exam  Nursing note and vitals reviewed. Constitutional: She is oriented to person, place, and time. She appears well-developed and well-nourished.  Non-toxic appearance. She does not appear ill. No distress.  HENT:  Head: Normocephalic and atraumatic.  Right Ear: External ear normal.  Left Ear: External ear normal.  Nose: Nose normal. No mucosal edema or rhinorrhea.  Mouth/Throat: Oropharynx is clear and moist and mucous membranes are normal. No dental abscesses or uvula swelling.  Eyes: Conjunctivae and EOM are normal. Pupils are equal, round, and reactive to light.  Neck: Normal range of motion and full passive range of motion without pain. Neck supple.  Cardiovascular: Normal heart sounds.  An irregularly irregular rhythm present. Tachycardia present.  Exam reveals no gallop and no friction rub.   No murmur heard. Tachycardic. Irregularly irregular.   Pulmonary/Chest: Effort normal and breath sounds normal. No respiratory distress. She has no wheezes. She has no rhonchi. She has no rales. She exhibits no tenderness and no crepitus.  Abdominal: Soft. Normal appearance and bowel sounds are normal. She exhibits no distension. There is no tenderness. There is no rebound and no guarding.  Musculoskeletal: Normal range of motion. She exhibits no tenderness.  Moves all extremities well. Legs appear swollen, but are non-pitting.   Neurological: She is alert and oriented to person, place, and time. She has normal strength. No  cranial nerve deficit.  Skin: Skin is warm, dry and intact. Lesion noted. No rash noted. No erythema. No pallor.  Multiple seborrheic keritosis and skin scaling with lesions that appear  pre-cancerous. Some are erythematous and bleeding, like the patient has been picking at them.   Psychiatric: She has a normal mood and affect. Her speech is normal and behavior is normal. Her mood appears not anxious.    ED Course  Procedures (including critical care time)  Medications  diltiazem (CARDIZEM) injection 10 mg (0 mg Intravenous Stopped 09/22/13 1604)  diltiazem (CARDIZEM) 100 mg in dextrose 5 % 100 mL infusion (0 mg/hr Intravenous Stopped 09/22/13 1755)  diltiazem (CARDIZEM) injection 10 mg (0 mg Intravenous Stopped 09/22/13 1753)  phytonadione (VITAMIN K) tablet 5 mg (5 mg Oral Given 09/22/13 2021)   DIAGNOSTIC STUDIES: Oxygen Saturation is 99% on room air, normal by my interpretation.    COORDINATION OF CARE: 3:25 PM- Ordered EKG.   3:51 PM- Ordered CBC, CMP, BNP, troponin, Protime-INR, and a chest x-ray. Will order Cardizem to manage symptoms. Discussed treatment plan with patient at bedside and patient verbalized agreement.   5:30 PM- Reviewed patient's past medical records, which indicate that the patient has not been evaluated by a cardiologist since April, 2014, or about a year ago. At that time, her heart rate was 60 and she was in atrial fibrillation and patient was able to tolerate this rhythm with a controlled rate per her cardiologist. Pt had  chronic atrial fibrillation. Patient was also anticaogulated with Coumadin during that time.  Independently reviewed preliminary lab and imaging results from today's encounter.  Patient was admitted April 7 for abdominal pain and hyponatremia. Review of that chart was done by me At that time her INR was hard to control. It was felt that her underlying liver disease made management of her Coumadin very difficult. Her discharge summary discussed they  recommended she stop the Coumadin and just take aspirin daily. Evidently since she left the hospital her PCP has elected to keep her on the Coumadin.  5:34 PM- Patient reports feeling a bit better after receiving the medication. Patient's heart rate improved to 96-110 after receiving the Cardizem, compared to 150 upon arrival to the ED. she reports receiving a letter yesterday reminded her of her yearly cardiology appointment . Discussed treatment plan with patient at bedside and patient verbalized agreement.   7:29 PM- Patient's heart rate is running between 96-105. Advised patient not to take Coumadin until her INR can be rechecked Monday. Advised her to follow up with her cardiologist. Patient continues to state she feels fine. Patient stable for discharge. Discussed treatment plan with patient at bedside and patient verbalized agreement.    Results for orders placed during the hospital encounter of 09/22/13  CBC WITH DIFFERENTIAL      Result Value Ref Range   WBC 11.0 (*) 4.0 - 10.5 K/uL   RBC 4.43  3.87 - 5.11 MIL/uL   Hemoglobin 11.2 (*) 12.0 - 15.0 g/dL   HCT 35.0 (*) 36.0 - 46.0 %   MCV 79.0  78.0 - 100.0 fL   MCH 25.3 (*) 26.0 - 34.0 pg   MCHC 32.0  30.0 - 36.0 g/dL   RDW 19.5 (*) 11.5 - 15.5 %   Platelets 149 (*) 150 - 400 K/uL   Neutrophils Relative % 91 (*) 43 - 77 %   Neutro Abs 9.9 (*) 1.7 - 7.7 K/uL   Lymphocytes Relative 6 (*) 12 - 46 %   Lymphs Abs 0.7  0.7 - 4.0 K/uL   Monocytes Relative 3  3 - 12 %   Monocytes Absolute 0.4  0.1 - 1.0 K/uL  Eosinophils Relative 0  0 - 5 %   Eosinophils Absolute 0.0  0.0 - 0.7 K/uL   Basophils Relative 0  0 - 1 %   Basophils Absolute 0.0  0.0 - 0.1 K/uL  COMPREHENSIVE METABOLIC PANEL      Result Value Ref Range   Sodium 127 (*) 137 - 147 mEq/L   Potassium 5.0  3.7 - 5.3 mEq/L   Chloride 88 (*) 96 - 112 mEq/L   CO2 23  19 - 32 mEq/L   Glucose, Bld 136 (*) 70 - 99 mg/dL   BUN 23  6 - 23 mg/dL   Creatinine, Ser 1.04  0.50 - 1.10  mg/dL   Calcium 9.8  8.4 - 10.5 mg/dL   Total Protein 6.1  6.0 - 8.3 g/dL   Albumin 2.8 (*) 3.5 - 5.2 g/dL   AST 28  0 - 37 U/L   ALT 38 (*) 0 - 35 U/L   Alkaline Phosphatase 89  39 - 117 U/L   Total Bilirubin 2.2 (*) 0.3 - 1.2 mg/dL   GFR calc non Af Amer 51 (*) >90 mL/min   GFR calc Af Amer 59 (*) >90 mL/min  TROPONIN I      Result Value Ref Range   Troponin I <0.30  <0.30 ng/mL  PRO B NATRIURETIC PEPTIDE      Result Value Ref Range   Pro B Natriuretic peptide (BNP) 835.6 (*) 0 - 450 pg/mL  PROTIME-INR      Result Value Ref Range   Prothrombin Time 56.1 (*) 11.6 - 15.2 seconds   INR 6.78 (*) 0.00 - 1.49   Dg Chest Portable 1 View  09/22/2013   CLINICAL DATA:  ATRIAL FIBRILLATION  EXAM: PORTABLE CHEST - 1 VIEW  COMPARISON:  DG CHEST 2 VIEW dated 09/05/2013; DG CHEST 1V PORT dated 05/21/2009  FINDINGS: The cardiac silhouette is enlarged. There is prominence of interstitial markings and mild peribronchial cuffing. No focal regions of consolidation or focal infiltrates. Degenerative changes appreciated within the shoulders. No acute osseous abnormalities.  IMPRESSION: Pulmonary vascular congestion/mild edema no focal regions of consolidation or focal infiltrates.   Electronically Signed   By: Margaree Mackintosh M.D.   On: 09/22/2013 16:16    EKG Interpretation   Date/Time:  Friday September 22 2013 15:25:45 EDT Ventricular Rate:  123 PR Interval:    QRS Duration: 62 QT Interval:  290 QTC Calculation: 415 R Axis:   -46 Text Interpretation:  Atrial fibrillation with rapid ventricular response  Low voltage QRS Left anterior fascicular block Cannot rule out Anterior  infarct (cited on or before 28-Aug-2013) When compared with ECG of  05-Sep-2013 14:30, Left anterior fascicular block is now Present  Nonspecific T wave abnormality now evident in Inferior leads heart rate is  faster Confirmed by Annamaria Salah  MD-I, Dimetri Armitage ([id number]) on 09/22/2013 3:37:15 PM      MDM   Final diagnoses:  Atrial  fibrillation with rapid ventricular response  Warfarin-induced coagulopathy   Plan discharge   Rolland Porter, MD, FACEP   I personally performed the services described in this documentation, which was scribed in my presence. The recorded information has been reviewed and considered.  Rolland Porter, MD, Abram Sander     Janice Norrie, MD 09/22/13 2032

## 2013-09-22 NOTE — ED Notes (Signed)
CRITICAL VALUE ALERT  Critical value received:  INR 6.78  Date of notification:  04824/2015  Time of notification:  1652  Critical value read back:yes  Nurse who received alert:  LRT  MD notified (1st page):  Tomi Bamberger, I  Time of first page: 1658  MD notified (2nd page):  Time of second page:  Responding MD: Eliane Decree  Time MD responded:  [phone]

## 2013-09-22 NOTE — ED Notes (Addendum)
Sent here from [place]. INR was 7.1 sent to doctor by home health nurse. While at [place] office, noticed patient was in atrial fib, which she has a hx of. Pt was given 2.5mg  of Vit K while in the office. Swelling to lower extremities which patient states is a little more than normal. Pt states she took her cardizem just before going to the doctor at 1300

## 2013-09-22 NOTE — Discharge Instructions (Signed)
Do not take your coumadin over the weekend. You need to talk to either Dr Laurance Flatten or Dr Percival Spanish about staying on the coumadin because of your underlying liver disease. The last time you were in the hospital they felt you should stop the coumadin and just take an aspirin a day.   Return to the ED if you get chest pain, shortness of breath, feel dizzy or light headed or feel bad.  Return if you start having bleeding problems or a headache.

## 2013-09-22 NOTE — Patient Instructions (Signed)
Anticoagulation Dose Instructions as of 09/22/2013     [name] Tue Wed Thu Fri Sat   New Dose 0.5 mg 0.5 mg 0.5 mg 0.5 mg 0.5 mg 0.5 mg 0.5 mg    Description       Hold warfarin and recheck Monday

## 2013-09-23 ENCOUNTER — Other Ambulatory Visit: Payer: Self-pay | Admitting: Family Medicine

## 2013-09-25 NOTE — Telephone Encounter (Signed)
Last seen 09/22/13  MMM This med not on EPIC list

## 2013-09-25 NOTE — Telephone Encounter (Signed)
Patient NTBS for follow up and lab work  

## 2013-09-27 ENCOUNTER — Ambulatory Visit: Payer: Medicare Other | Admitting: Family Medicine

## 2013-09-28 ENCOUNTER — Ambulatory Visit: Payer: Medicare Other | Admitting: Pharmacist

## 2013-09-28 DIAGNOSIS — I4891 Unspecified atrial fibrillation: Secondary | ICD-10-CM

## 2013-09-28 NOTE — Progress Notes (Signed)
Encounter to removed patient from anticoagulation clinic - deceased 09-27-13

## 2013-09-29 DEATH — deceased

## 2013-11-07 ENCOUNTER — Ambulatory Visit (INDEPENDENT_AMBULATORY_CARE_PROVIDER_SITE_OTHER): Payer: Medicare Other | Admitting: Internal Medicine

## 2013-11-27 ENCOUNTER — Ambulatory Visit (INDEPENDENT_AMBULATORY_CARE_PROVIDER_SITE_OTHER): Payer: Medicare Other | Admitting: Internal Medicine

## 2015-11-08 IMAGING — CT CT ABD-PELV W/ CM
2 of 5 series · 16 of 46 positions shown, 18 images · IV contrast (Omnipaque 300)
Comparison: none

[Series 2: abd_pel_with 5.0 b40f · axial · 0.86mm/px · z∈[-402,-22]mm · 13 of 86 slices shown, 15 images]
[im 5/86  soft-tissue]
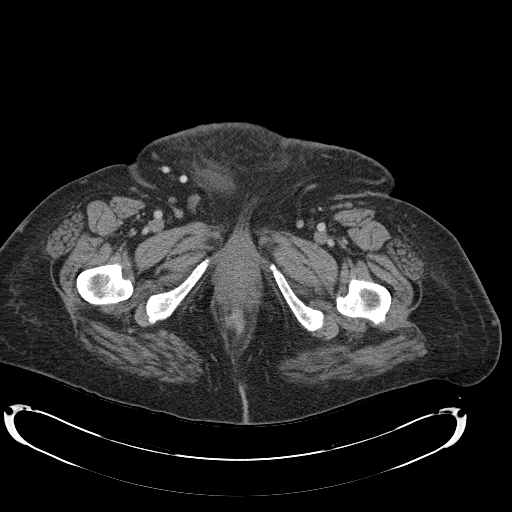
[im 5/86  bone]
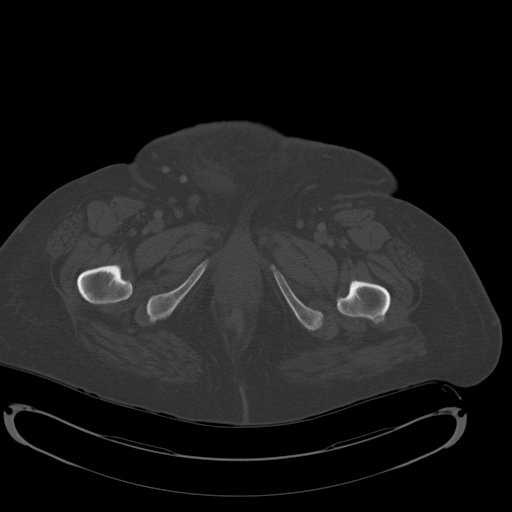
[im 10/86  soft-tissue]
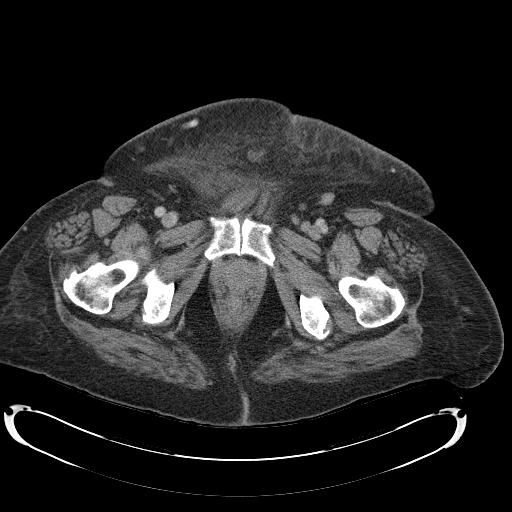
[im 19/86  soft-tissue]
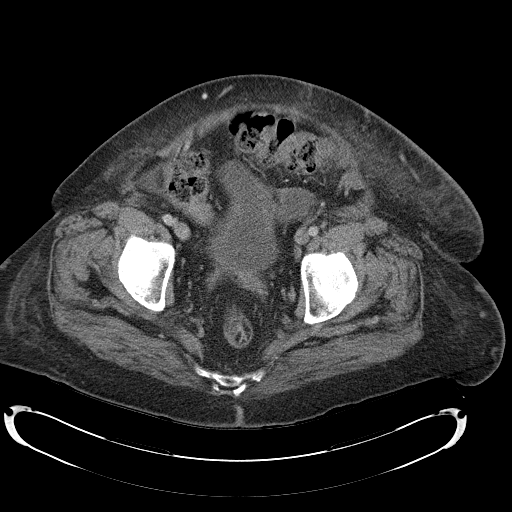
[im 24/86  soft-tissue]
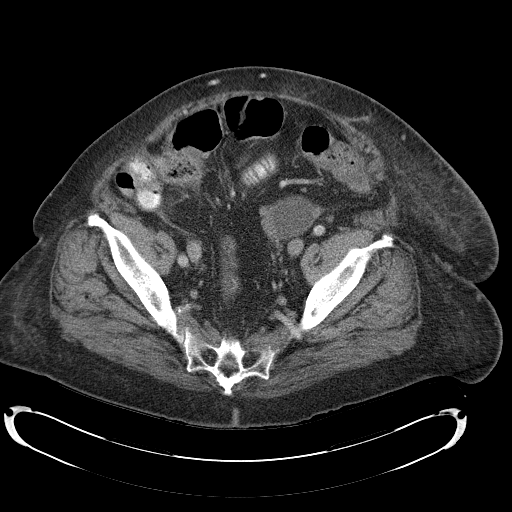
[im 29/86  soft-tissue]
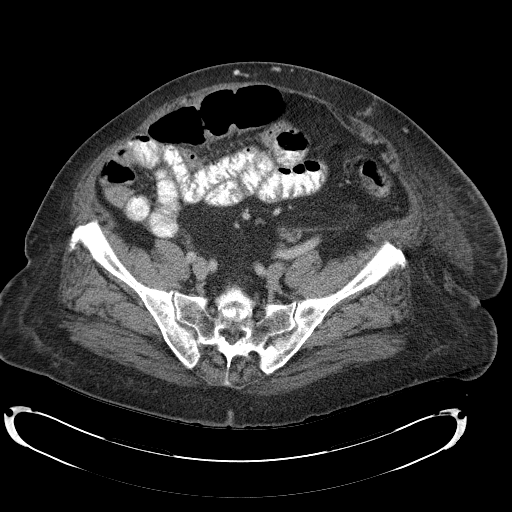
[im 38/86  soft-tissue]
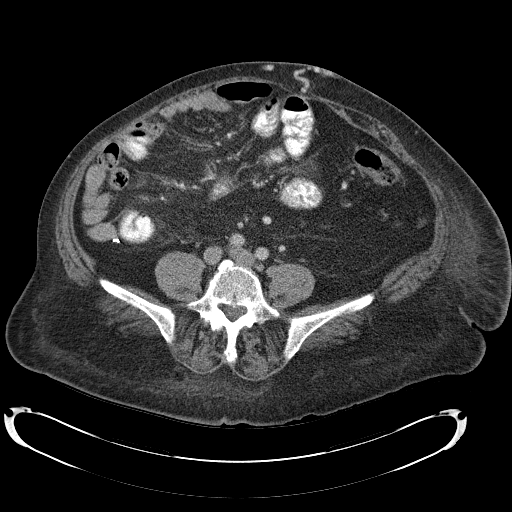
[im 43/86  soft-tissue]
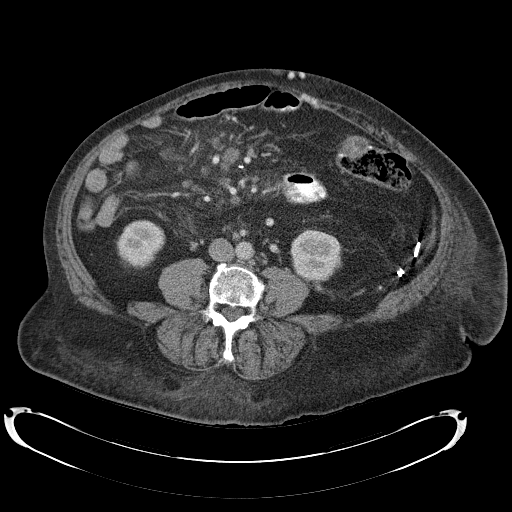
[im 48/86  soft-tissue]
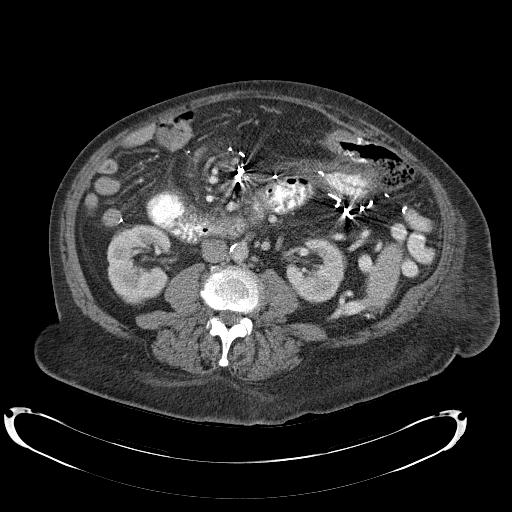
[im 57/86  soft-tissue]
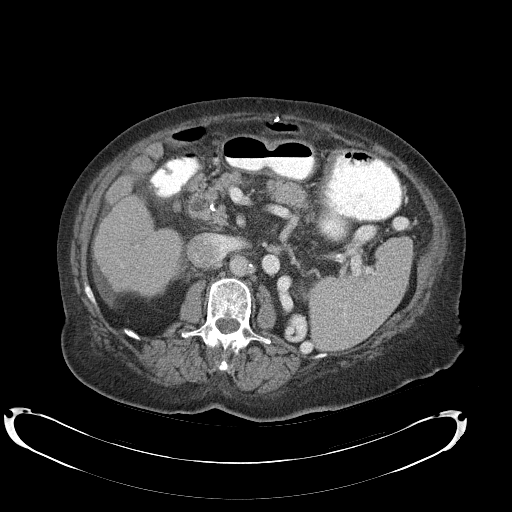
[im 57/86  bone]
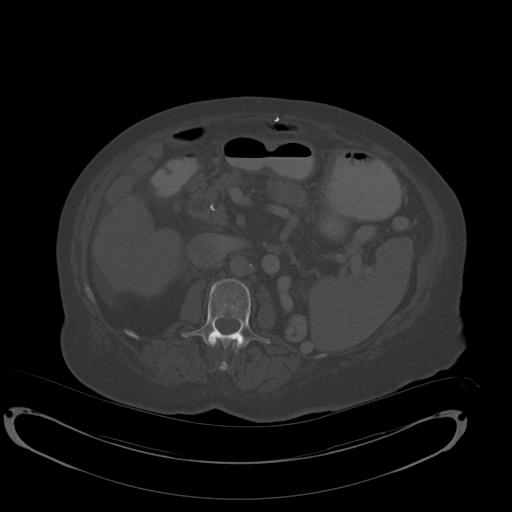
[im 62/86  soft-tissue]
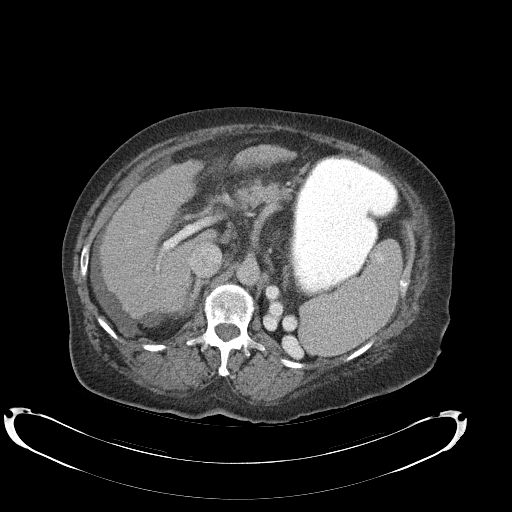
[im 67/86  soft-tissue]
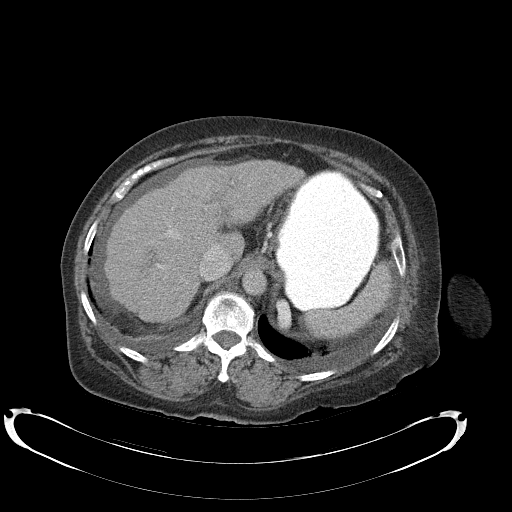
[im 76/86  soft-tissue]
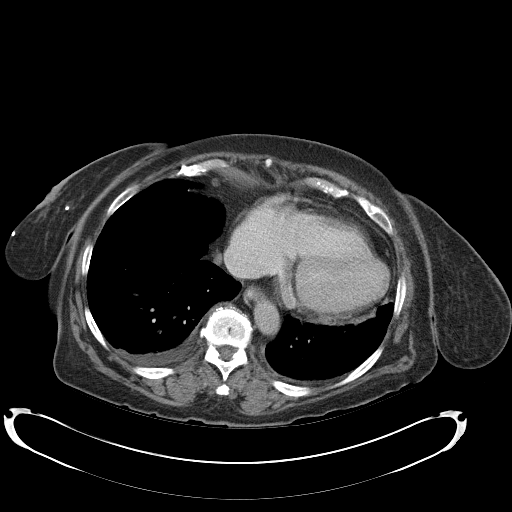
[im 81/86  soft-tissue]
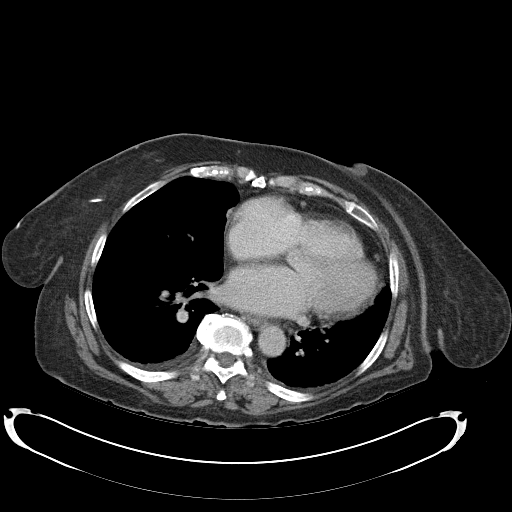

[Series 5: abd_pel_with 3.0 spo · coronal · 0.78mm/px · 3 of 100 slices shown]
[im 34/100  soft-tissue]
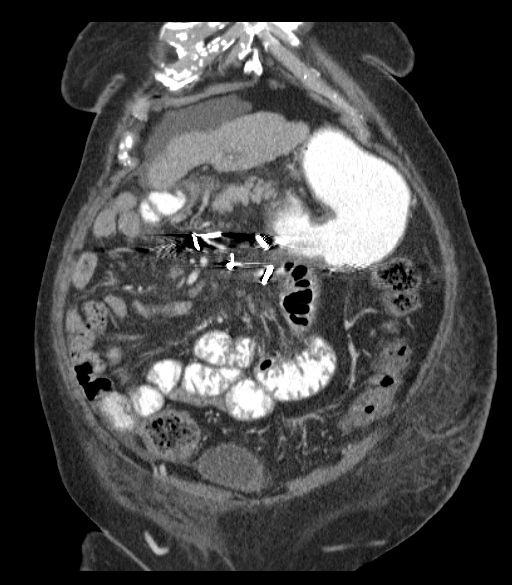
[im 45/100  soft-tissue]
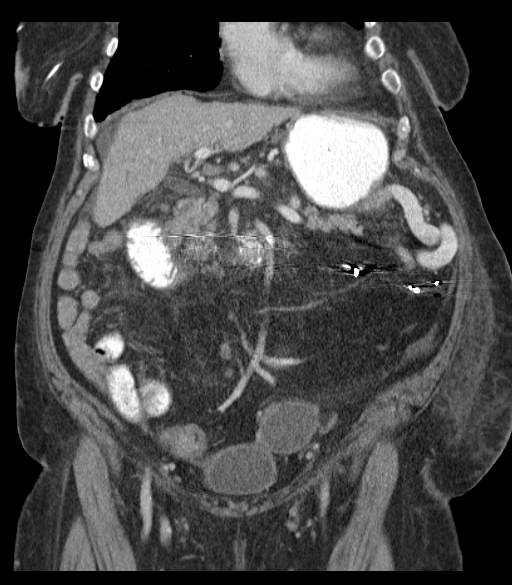
[im 56/100  soft-tissue]
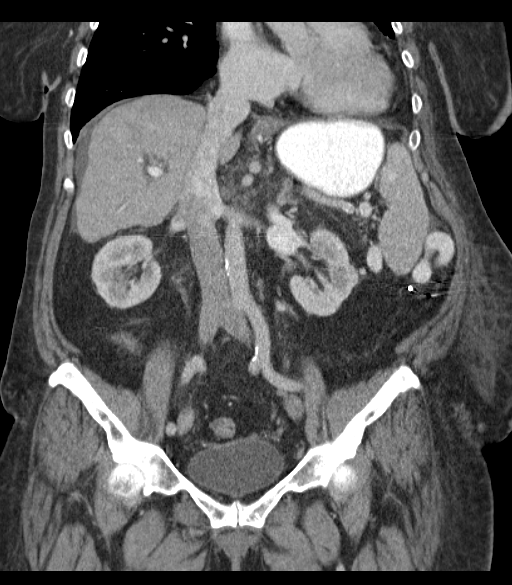

[16 of 46 positions shown; findings below may reference images not displayed]

CLINICAL DATA
Diffuse abdominal pain

EXAM
CT ABDOMEN AND PELVIS WITH CONTRAST

TECHNIQUE
Multidetector CT imaging of the abdomen and pelvis was performed
using the standard protocol following bolus administration of
intravenous contrast.

CONTRAST
50mL OMNIPAQUE IOHEXOL 300 MG/ML SOLN, 100mL OMNIPAQUE IOHEXOL 300
MG/ML SOLN

COMPARISON
03/04/2013

FINDINGS
BODY WALL: There is a circumscribed dermal lesion in the medial
lower left chest wall, most likely a dermal inclusion cyst. Skin
thickening and subcutaneous reticulation in the lower abdominal wall
is stable from prior.

LOWER CHEST: Small bilateral pleural effusions.

ABDOMEN/PELVIS:

Liver: Lobulated liver contour consistent with cirrhosis. No
evidence of mass. Small volume ascites stable from prior. Portal
venous hypertension with large splenorenal shunt.

Biliary: Cholecystectomy.  Stable mild common bile duct enlargement.

Pancreas: Unchanged coarse calcification in the pancreatic uncinate.

Spleen: Unchanged high-density or hypervascularity and anterior
spleen, 1 cm in diameter.

Adrenals: Unremarkable.

Kidneys and ureters: No hydronephrosis or stone.

Bladder: Unremarkable.

Reproductive: Hysterectomy. 4 mm cyst in the left ovary is not
significantly changed over 13 months.

Bowel: Suspected extended right hemicolectomy. At the ileocolic
anastomosis in the central to left abdomen, there is circumferential
bowel wall thickening. This was the site of previous bowel
obstruction.

Retroperitoneum: Diffuse mesenteric and small bowel adenopathy edema
which is likely reactive.

Peritoneum: Small volume ascites stable from prior.

Vascular: Portal hypertension, as above.

OSSEOUS: No acute abnormalities.

IMPRESSION
1. Suspect low grade enteritis at the entero colonic anastomosis in
this patient with history of Crohn's disease.
2. Cirrhosis with portal hypertension.
3. Small bilateral pleural effusions.

SIGNATURE
